# Patient Record
Sex: Male | Born: 1980
Health system: Southern US, Community
[De-identification: ages and names within clinical notes are randomized; demographics above are authoritative.]

## PROBLEM LIST (undated history)

## (undated) DIAGNOSIS — A510 Primary genital syphilis: Secondary | ICD-10-CM

## (undated) DIAGNOSIS — R7303 Prediabetes: Secondary | ICD-10-CM

## (undated) DIAGNOSIS — B20 Human immunodeficiency virus [HIV] disease: Secondary | ICD-10-CM

## (undated) DIAGNOSIS — R7989 Other specified abnormal findings of blood chemistry: Secondary | ICD-10-CM

## (undated) DIAGNOSIS — K1379 Other lesions of oral mucosa: Secondary | ICD-10-CM

## (undated) DIAGNOSIS — E785 Hyperlipidemia, unspecified: Secondary | ICD-10-CM

## (undated) DIAGNOSIS — E669 Obesity, unspecified: Secondary | ICD-10-CM

## (undated) HISTORY — DX: Other lesions of oral mucosa: K13.79

## (undated) HISTORY — DX: Primary genital syphilis: A51.0

## (undated) HISTORY — DX: Obesity, unspecified: E66.9

## (undated) HISTORY — DX: Prediabetes: R73.03

## (undated) HISTORY — DX: Other specified abnormal findings of blood chemistry: R79.89

## (undated) HISTORY — DX: Human immunodeficiency virus (HIV) disease: B20

## (undated) HISTORY — DX: Hyperlipidemia, unspecified: E78.5

---

## 2009-04-01 ENCOUNTER — Emergency Department (HOSPITAL_COMMUNITY): Admission: EM | Admit: 2009-04-01 | Discharge: 2009-04-01 | Payer: Self-pay | Admitting: Emergency Medicine

## 2009-04-14 ENCOUNTER — Emergency Department (HOSPITAL_COMMUNITY): Admission: EM | Admit: 2009-04-14 | Discharge: 2009-04-14 | Payer: Self-pay | Admitting: Emergency Medicine

## 2015-01-01 ENCOUNTER — Encounter (HOSPITAL_COMMUNITY): Payer: Self-pay

## 2015-01-01 ENCOUNTER — Emergency Department (HOSPITAL_COMMUNITY): Payer: Self-pay

## 2015-01-01 ENCOUNTER — Emergency Department (HOSPITAL_COMMUNITY)
Admission: EM | Admit: 2015-01-01 | Discharge: 2015-01-01 | Disposition: A | Discharge status: Home / Self Care | Payer: Self-pay | Attending: Emergency Medicine | Admitting: Emergency Medicine

## 2015-01-01 DIAGNOSIS — Y998 Other external cause status: Secondary | ICD-10-CM | POA: Insufficient documentation

## 2015-01-01 DIAGNOSIS — Z79899 Other long term (current) drug therapy: Secondary | ICD-10-CM | POA: Insufficient documentation

## 2015-01-01 DIAGNOSIS — W231XXA Caught, crushed, jammed, or pinched between stationary objects, initial encounter: Secondary | ICD-10-CM | POA: Insufficient documentation

## 2015-01-01 DIAGNOSIS — Z23 Encounter for immunization: Secondary | ICD-10-CM | POA: Insufficient documentation

## 2015-01-01 DIAGNOSIS — L03011 Cellulitis of right finger: Secondary | ICD-10-CM | POA: Insufficient documentation

## 2015-01-01 DIAGNOSIS — Y9389 Activity, other specified: Secondary | ICD-10-CM | POA: Insufficient documentation

## 2015-01-01 DIAGNOSIS — Y9289 Other specified places as the place of occurrence of the external cause: Secondary | ICD-10-CM | POA: Insufficient documentation

## 2015-01-01 MED ORDER — HYDROCODONE-ACETAMINOPHEN 5-325 MG PO TABS: 1.0000 | ORAL_TABLET | Freq: Four times a day (QID) | ORAL | Status: DC | PRN

## 2015-01-01 MED ORDER — TETANUS-DIPHTH-ACELL PERTUSSIS 5-2.5-18.5 LF-MCG/0.5 IM SUSP
0.5000 mL | Freq: Once | INTRAMUSCULAR | Status: AC
  Administered 2015-01-01: 0.5 mL via INTRAMUSCULAR
  Filled 2015-01-01: qty 0.5

## 2015-01-01 MED ORDER — CLINDAMYCIN HCL 300 MG PO CAPS: 300.0000 mg | ORAL_CAPSULE | Freq: Three times a day (TID) | ORAL | Status: DC

## 2015-01-01 NOTE — Discharge Instructions (Signed)
Follow up for continued or worsening swelling. Cellulitis Cellulitis is an infection of the skin and the tissue beneath it. The infected area is usually red and tender. Cellulitis occurs most often in the arms and lower legs.  CAUSES  Cellulitis is caused by bacteria that enter the skin through cracks or cuts in the skin. The most common types of bacteria that cause cellulitis are staphylococci and streptococci. SIGNS AND SYMPTOMS   Redness and warmth.  Swelling.  Tenderness or pain.  Fever. DIAGNOSIS  Your health care provider can usually determine what is wrong based on a physical exam. Blood tests may also be done. TREATMENT  Treatment usually involves taking an antibiotic medicine. HOME CARE INSTRUCTIONS   Take your antibiotic medicine as directed by your health care provider. Finish the antibiotic even if you start to feel better.  Keep the infected arm or leg elevated to reduce swelling.  Apply a warm cloth to the affected area up to 4 times per day to relieve pain.  Take medicines only as directed by your health care provider.  Keep all follow-up visits as directed by your health care provider. SEEK MEDICAL CARE IF:   You notice red streaks coming from the infected area.  Your red area gets larger or turns dark in color.  Your bone or joint underneath the infected area becomes painful after the skin has healed.  Your infection returns in the same area or another area.  You notice a swollen bump in the infected area.  You develop new symptoms.  You have a fever. SEEK IMMEDIATE MEDICAL CARE IF:   You feel very sleepy.  You develop vomiting or diarrhea.  You have a general ill feeling (malaise) with muscle aches and pains. MAKE SURE YOU:   Understand these instructions.  Will watch your condition.  Will get help right away if you are not doing well or get worse. Document Released: 08/15/2005 Document Revised: 03/22/2014 Document Reviewed:  01/21/2012 [facility] Patient Information 2015 [employer] [place]. This information is not intended to replace advice given to you by your health care provider. Make sure you discuss any questions you have with your health care provider.

## 2015-01-01 NOTE — ED Notes (Signed)
Patient presents today with a chief complaint of swelling to right 4th digit of hand x 1 week after smashing it between a shipping container. Patient reports increased swelling and pain over past few days.

## 2015-01-01 NOTE — ED Provider Notes (Signed)
CSN: [id number]     Arrival date & time 01/01/15  1020 History   First MD Initiated Contact with Patient 01/01/15 1022     No chief complaint on file.    (Consider location/radiation/quality/duration/timing/severity/associated sxs/prior Treatment) HPI Comments: Pt comes in with complaint of right ring finger pain. He states that he hit it while lifting boxes at work and then he was hit again yesterday. States that he is having pain swelling and mild redness to the area. No previous injury to finger. Unsure of tetanus status. Denies numbness or weakness  The history is provided by the patient. No language interpreter was used.    No past medical history on file. No past surgical history on file. No family history on file. History  Substance Use Topics  . Smoking status: Not on file  . Smokeless tobacco: Not on file  . Alcohol Use: Not on file    Review of Systems  All other systems reviewed and are negative.     Allergies  Review of patient's allergies indicates no known allergies.  Home Medications   Prior to Admission medications   Medication Sig Start Date End Date Taking? Authorizing Provider  Amino Acids (L-CARNITINE PO) Take 1 tablet by mouth daily.   Yes Historical Provider, MD  Ascorbic Acid (VITAMIN C PO) Take 1 tablet by mouth daily.   Yes Historical Provider, MD  diphenhydrAMINE (BENADRYL) 12.5 MG/5ML elixir Take 25 mg by mouth 4 (four) times daily as needed for itching.   Yes Historical Provider, MD  ibuprofen (ADVIL,MOTRIN) 200 MG tablet Take 400 mg by mouth every 4 (four) hours as needed for moderate pain.   Yes Historical Provider, MD  Multiple Vitamin (MULTIVITAMIN WITH MINERALS) TABS tablet Take 1 tablet by mouth daily.   Yes Historical Provider, MD  OVER THE COUNTER MEDICATION Take 1-2 scoop by mouth 2 (two) times daily. Hydroxy-cut..takes one scoop in the morning and two scoop in the afternoon.   Yes Historical Provider, MD  Probiotic Product (PROBIOTIC  DAILY PO) Take 1 tablet by mouth daily.   Yes Historical Provider, MD   There were no vitals taken for this visit. Physical Exam  Constitutional: He is oriented to person, place, and time. He appears well-developed and well-nourished.  Cardiovascular: Normal rate and regular rhythm.   Pulmonary/Chest: Effort normal and breath sounds normal.  Musculoskeletal:  Swelling and tenderness to the right ring mip. Cap refill <3. Mild redness to the palmar aspect of the finger. Full rom  Neurological: He is alert and oriented to person, place, and time.  Nursing note and vitals reviewed.   ED Course  Procedures (including critical care time) Labs Review Labs Reviewed - No data to display  Imaging Review Dg Finger Ring Right  01/01/2015   CLINICAL DATA:  Injury to th right ring finger; swelling on the finger; pt states that he hurt his right ring finger about a week ago at work, no swelling was noted, but the finger started swelling yesterday after he hit the finger with a box at work; no previous injury; most pain in lateral side of right ring finger  EXAM: RIGHT RING FINGER 2+V  COMPARISON:  None.  FINDINGS: No acute fracture or dislocation. Soft tissue swelling is present. No radiopaque foreign body or soft tissue gas.  IMPRESSION: Soft tissue swelling.  No fracture.   Electronically Signed   By: Norva Pavlov M.D.   On: 01/01/2015 11:30     EKG Interpretation None  MDM   Final diagnoses:  Cellulitis of finger of right hand    No fracture noted to the finger. No definite abscess noted. Will treat with clindamycin. Discussed return precautions with pt   Teressa LowerVrinda Jaise Moser, NP 01/01/15 1147  Linwood DibblesJon Knapp, MD 01/01/15 1626

## 2018-10-20 ENCOUNTER — Encounter (HOSPITAL_COMMUNITY): Payer: Self-pay | Admitting: Family Medicine

## 2018-10-20 ENCOUNTER — Other Ambulatory Visit: Payer: Self-pay

## 2018-10-20 ENCOUNTER — Emergency Department (HOSPITAL_COMMUNITY): Payer: Self-pay

## 2018-10-20 ENCOUNTER — Inpatient Hospital Stay (HOSPITAL_COMMUNITY)
Admission: EM | Admit: 2018-10-20 | Discharge: 2018-10-29 | DRG: 974 | Disposition: A | Discharge status: Home Health | Payer: Self-pay | Attending: Family Medicine | Admitting: Family Medicine

## 2018-10-20 DIAGNOSIS — R945 Abnormal results of liver function studies: Secondary | ICD-10-CM

## 2018-10-20 DIAGNOSIS — E8809 Other disorders of plasma-protein metabolism, not elsewhere classified: Secondary | ICD-10-CM | POA: Diagnosis present

## 2018-10-20 DIAGNOSIS — B370 Candidal stomatitis: Secondary | ICD-10-CM | POA: Diagnosis present

## 2018-10-20 DIAGNOSIS — Z8249 Family history of ischemic heart disease and other diseases of the circulatory system: Secondary | ICD-10-CM

## 2018-10-20 DIAGNOSIS — R6520 Severe sepsis without septic shock: Secondary | ICD-10-CM | POA: Diagnosis present

## 2018-10-20 DIAGNOSIS — R5081 Fever presenting with conditions classified elsewhere: Secondary | ICD-10-CM | POA: Diagnosis present

## 2018-10-20 DIAGNOSIS — R7989 Other specified abnormal findings of blood chemistry: Secondary | ICD-10-CM

## 2018-10-20 DIAGNOSIS — D72819 Decreased white blood cell count, unspecified: Secondary | ICD-10-CM | POA: Diagnosis present

## 2018-10-20 DIAGNOSIS — D649 Anemia, unspecified: Secondary | ICD-10-CM

## 2018-10-20 DIAGNOSIS — J120 Adenoviral pneumonia: Secondary | ICD-10-CM | POA: Diagnosis present

## 2018-10-20 DIAGNOSIS — B3781 Candidal esophagitis: Secondary | ICD-10-CM | POA: Diagnosis present

## 2018-10-20 DIAGNOSIS — B20 Human immunodeficiency virus [HIV] disease: Secondary | ICD-10-CM | POA: Diagnosis present

## 2018-10-20 DIAGNOSIS — R809 Proteinuria, unspecified: Secondary | ICD-10-CM | POA: Diagnosis present

## 2018-10-20 DIAGNOSIS — A419 Sepsis, unspecified organism: Principal | ICD-10-CM | POA: Diagnosis present

## 2018-10-20 DIAGNOSIS — D709 Neutropenia, unspecified: Secondary | ICD-10-CM | POA: Diagnosis present

## 2018-10-20 DIAGNOSIS — E875 Hyperkalemia: Secondary | ICD-10-CM | POA: Diagnosis present

## 2018-10-20 DIAGNOSIS — B59 Pneumocystosis: Secondary | ICD-10-CM | POA: Diagnosis present

## 2018-10-20 DIAGNOSIS — D509 Iron deficiency anemia, unspecified: Secondary | ICD-10-CM | POA: Diagnosis present

## 2018-10-20 DIAGNOSIS — R808 Other proteinuria: Secondary | ICD-10-CM

## 2018-10-20 DIAGNOSIS — J189 Pneumonia, unspecified organism: Secondary | ICD-10-CM

## 2018-10-20 DIAGNOSIS — R748 Abnormal levels of other serum enzymes: Secondary | ICD-10-CM

## 2018-10-20 DIAGNOSIS — D638 Anemia in other chronic diseases classified elsewhere: Secondary | ICD-10-CM | POA: Diagnosis present

## 2018-10-20 DIAGNOSIS — J9601 Acute respiratory failure with hypoxia: Secondary | ICD-10-CM | POA: Diagnosis present

## 2018-10-20 LAB — CBC WITH DIFFERENTIAL/PLATELET
Abs Immature Granulocytes: 0.24 10*3/uL — ABNORMAL HIGH (ref 0.00–0.07)
Basophils Absolute: 0 10*3/uL (ref 0.0–0.1)
Basophils Relative: 0 %
Eosinophils Absolute: 0 10*3/uL (ref 0.0–0.5)
Eosinophils Relative: 0 %
HCT: 29.6 % — ABNORMAL LOW (ref 39.0–52.0)
HEMOGLOBIN: 9.3 g/dL — AB (ref 13.0–17.0)
Immature Granulocytes: 12 %
LYMPHS PCT: 18 %
Lymphs Abs: 0.4 10*3/uL — ABNORMAL LOW (ref 0.7–4.0)
MCH: 27.9 pg (ref 26.0–34.0)
MCHC: 31.4 g/dL (ref 30.0–36.0)
MCV: 88.9 fL (ref 80.0–100.0)
Monocytes Absolute: 0.7 10*3/uL (ref 0.1–1.0)
Monocytes Relative: 34 %
Neutro Abs: 0.7 10*3/uL — ABNORMAL LOW (ref 1.7–7.7)
Neutrophils Relative %: 36 %
Platelets: 383 10*3/uL (ref 150–400)
RBC: 3.33 MIL/uL — ABNORMAL LOW (ref 4.22–5.81)
RDW: 15 % (ref 11.5–15.5)
WBC: 2.1 10*3/uL — ABNORMAL LOW (ref 4.0–10.5)
nRBC: 0 % (ref 0.0–0.2)

## 2018-10-20 LAB — URINALYSIS, ROUTINE W REFLEX MICROSCOPIC
Bacteria, UA: NONE SEEN
Bilirubin Urine: NEGATIVE
Glucose, UA: NEGATIVE mg/dL
Hgb urine dipstick: NEGATIVE
Ketones, ur: 5 mg/dL — AB
Leukocytes, UA: NEGATIVE
Nitrite: NEGATIVE
Protein, ur: 100 mg/dL — AB
Specific Gravity, Urine: 1.016 (ref 1.005–1.030)
pH: 6 (ref 5.0–8.0)

## 2018-10-20 LAB — COMPREHENSIVE METABOLIC PANEL
ALT: 91 U/L — ABNORMAL HIGH (ref 0–44)
AST: 104 U/L — ABNORMAL HIGH (ref 15–41)
Albumin: 3.2 g/dL — ABNORMAL LOW (ref 3.5–5.0)
Alkaline Phosphatase: 146 U/L — ABNORMAL HIGH (ref 38–126)
Anion gap: 11 (ref 5–15)
BUN: 17 mg/dL (ref 6–20)
CO2: 25 mmol/L (ref 22–32)
Calcium: 9.2 mg/dL (ref 8.9–10.3)
Chloride: 99 mmol/L (ref 98–111)
Creatinine, Ser: 1.11 mg/dL (ref 0.61–1.24)
GFR calc Af Amer: >60 mL/min (ref >60)
GFR calc non Af Amer: >60 mL/min (ref >60)
Glucose, Bld: 116 mg/dL — ABNORMAL HIGH (ref 70–99)
POTASSIUM: 3.9 mmol/L (ref 3.5–5.1)
SODIUM: 135 mmol/L (ref 135–145)
Total Bilirubin: 0.8 mg/dL (ref 0.3–1.2)
Total Protein: 7.8 g/dL (ref 6.5–8.1)

## 2018-10-20 LAB — CG4 I-STAT (LACTIC ACID): Lactic Acid, Venous: 1.02 mmol/L (ref 0.5–1.9)

## 2018-10-20 MED ORDER — SODIUM CHLORIDE 0.9 % IV SOLN
Freq: Once | INTRAVENOUS | Status: AC
  Administered 2018-10-20: 22:00:00 via INTRAVENOUS

## 2018-10-20 MED ORDER — SODIUM CHLORIDE 0.9 % IV BOLUS
1000.0000 mL | Freq: Once | INTRAVENOUS | Status: AC
  Administered 2018-10-20: 1000 mL via INTRAVENOUS

## 2018-10-20 MED ORDER — SODIUM CHLORIDE 0.9 % IV SOLN
500.0000 mg | INTRAVENOUS | Status: DC
  Administered 2018-10-20: 500 mg via INTRAVENOUS
  Filled 2018-10-20: qty 500

## 2018-10-20 MED ORDER — ACETAMINOPHEN 500 MG PO TABS
1000.0000 mg | ORAL_TABLET | Freq: Once | ORAL | Status: AC
  Administered 2018-10-20: 1000 mg via ORAL
  Filled 2018-10-20: qty 2

## 2018-10-20 MED ORDER — IPRATROPIUM-ALBUTEROL 0.5-2.5 (3) MG/3ML IN SOLN
3.0000 mL | Freq: Once | RESPIRATORY_TRACT | Status: AC
  Administered 2018-10-20: 3 mL via RESPIRATORY_TRACT
  Filled 2018-10-20: qty 3

## 2018-10-20 MED ORDER — SODIUM CHLORIDE 0.9 % IV BOLUS
1000.0000 mL | Freq: Once | INTRAVENOUS | Status: AC
  Administered 2018-10-21: 1000 mL via INTRAVENOUS

## 2018-10-20 MED ORDER — SODIUM CHLORIDE 0.9 % IV SOLN
2.0000 g | INTRAVENOUS | Status: DC
  Administered 2018-10-20: 2 g via INTRAVENOUS
  Filled 2018-10-20: qty 20

## 2018-10-20 NOTE — ED Provider Notes (Addendum)
[facility]-EMERGENCY DEPT Provider Note   CSN: [id number] Arrival date & time: 10/20/18  1933     History   Chief Complaint Chief Complaint  Patient presents with  . Shortness of Breath    HPI Alex Mitchell is a 37 y.o. male.  Level 5 caveat for urgent need for intervention.  Patient presents with cough and dyspnea for the past 2 to 3 days.  Symptoms have worsened.  He is generally healthy.  Non-smoker.  No chronic illness.  Social history: Programmer, multimedia.     History reviewed. No pertinent past medical history.  There are no active problems to display for this patient.   History reviewed. No pertinent surgical history.      Home Medications    Prior to Admission medications   Medication Sig Start Date End Date Taking? Authorizing Provider  albuterol (PROVENTIL HFA;VENTOLIN HFA) 108 (90 Base) MCG/ACT inhaler Inhale 1-2 puffs into the lungs every 6 (six) hours as needed for wheezing or shortness of breath.  08/01/18  Yes [provider]  Ascorbic Acid (VITAMIN C PO) Take 1 tablet by mouth daily.   Yes [provider]  chlorpheniramine-HYDROcodone (TUSSIONEX) 10-8 MG/5ML SUER Take 5 mLs by mouth every 12 (twelve) hours as needed for cough.  09/21/18  Yes [provider]  ibuprofen (ADVIL,MOTRIN) 200 MG tablet Take 200 mg by mouth every 4 (four) hours as needed for headache.    Yes [provider]  Multiple Vitamin (MULTIVITAMIN WITH MINERALS) TABS tablet Take 1 tablet by mouth daily.   Yes [provider]  clindamycin (CLEOCIN) 300 MG capsule Take 1 capsule (300 mg total) by mouth 3 (three) times daily. Patient not taking: Reported on 10/20/2018 01/01/15   Teressa Lower, NP  HYDROcodone-acetaminophen (NORCO/VICODIN) 5-325 MG per tablet Take 1-2 tablets by mouth every 6 (six) hours as needed. Patient not taking: Reported on 10/20/2018 01/01/15   Teressa Lower, NP    Family History History reviewed. No  pertinent family history.  Social History Social History   Tobacco Use  . Smoking status: Never Smoker  . Smokeless tobacco: Never Used  Substance Use Topics  . Alcohol use: Not Currently  . Drug use: No     Allergies   Patient has no known allergies.   Review of Systems Review of Systems  Unable to perform ROS: Acuity of condition     Physical Exam Updated Vital Signs BP (!) 141/72   Pulse (!) 125   Temp (!) 102 F (38.9 C) (Oral)   Resp (!) 38   Ht 5\' 8"  (1.727 m)   Wt 127 kg   SpO2 97%   BMI 42.57 kg/m   Physical Exam  Constitutional: He is oriented to person, place, and time.  Dyspneic, tachypneic, low pulse ox.  HENT:  Head: Normocephalic and atraumatic.  Eyes: Conjunctivae are normal.  Neck: Neck supple.  Cardiovascular: Normal rate and regular rhythm.  Pulmonary/Chest:  Tachypneic, not moving air well.  Abdominal: Soft. Bowel sounds are normal.  Musculoskeletal: Normal range of motion.  Neurological: He is alert and oriented to person, place, and time.  Skin: Skin is warm and dry.  Psychiatric: He has a normal mood and affect. His behavior is normal.  Nursing note and vitals reviewed.    ED Treatments / Results  Labs (all labs ordered are listed, but only abnormal results are displayed) Labs Reviewed  COMPREHENSIVE METABOLIC PANEL - Abnormal; Notable for the following components:      Result  Value   Glucose, Bld 116 (*)    Albumin 3.2 (*)    AST 104 (*)    ALT 91 (*)    Alkaline Phosphatase 146 (*)    All other components within normal limits  CBC WITH DIFFERENTIAL/PLATELET - Abnormal; Notable for the following components:   RBC 3.33 (*)    Hemoglobin 9.3 (*)    HCT 29.6 (*)    All other components within normal limits  URINALYSIS, ROUTINE W REFLEX MICROSCOPIC - Abnormal; Notable for the following components:   Ketones, ur 5 (*)    Protein, ur 100 (*)    All other components within normal limits  CULTURE, BLOOD (ROUTINE X 2)    CULTURE, BLOOD (ROUTINE X 2)  BLOOD GAS, ARTERIAL  INFLUENZA PANEL BY PCR (TYPE A & B)  D-DIMER, QUANTITATIVE (NOT [facility])  TROPONIN I  I-STAT CG4 LACTIC ACID, ED  CG4 I-STAT (LACTIC ACID)  I-STAT CG4 LACTIC ACID, ED    EKG None  Radiology Dg Chest 2 View  Result Date: 10/20/2018 CLINICAL DATA:  Shortness of breath EXAM: CHEST - 2 VIEW COMPARISON:  None. FINDINGS: Patchy bilateral airspace opacities are noted, right slightly greater than left concerning for pneumonia. Heart is normal size. No effusions or acute bony abnormality. IMPRESSION: Patchy bilateral airspace disease concerning for multifocal pneumonia. Electronically Signed   By: Charlett NoseKevin  Dover M.D.   On: 10/20/2018 21:15    Procedures Procedures (including critical care time)  Medications Ordered in ED Medications  cefTRIAXone (ROCEPHIN) 2 g in sodium chloride 0.9 % 100 mL IVPB ( Intravenous Rate/Dose Verify 10/20/18 2249)  azithromycin (ZITHROMAX) 500 mg in sodium chloride 0.9 % 250 mL IVPB (500 mg Intravenous New Bag/Given 10/20/18 2254)  sodium chloride 0.9 % bolus 1,000 mL (1,000 mLs Intravenous New Bag/Given 10/20/18 2300)  sodium chloride 0.9 % bolus 1,000 mL (has no administration in time range)  acetaminophen (TYLENOL) tablet 1,000 mg (1,000 mg Oral Given 10/20/18 2143)  ipratropium-albuterol (DUONEB) 0.5-2.5 (3) MG/3ML nebulizer solution 3 mL (3 mLs Nebulization Given 10/20/18 2206)  0.9 %  sodium chloride infusion ( Intravenous New Bag/Given 10/20/18 2204)     Initial Impression / Assessment and Plan / ED Course  I have reviewed the triage vital signs and the nursing notes.  Pertinent labs & imaging results that were available during my care of the patient were reviewed by me and considered in my medical decision making (see chart for details).     History and physical most consistent with community-acquired pneumonia.  Oxygen, IV fluids, IV Rocephin, IV Zithromax, admit to general medicine.   CRITICAL  CARE Performed by: Donnetta HutchingBrian Jazzmon Prindle Total critical care time: 30 minutes Critical care time was exclusive of separately billable procedures and treating other patients. Critical care was necessary to treat or prevent imminent or life-threatening deterioration. Critical care was time spent personally by me on the following activities: development of treatment plan with patient and/or surrogate as well as nursing, discussions with consultants, evaluation of patient's response to treatment, examination of patient, obtaining history from patient or surrogate, ordering and performing treatments and interventions, ordering and review of laboratory studies, ordering and review of radiographic studies, pulse oximetry and re-evaluation of patient's condition.  Final Clinical Impressions(s) / ED Diagnoses   Final diagnoses:  Community acquired pneumonia, unspecified laterality  Anemia, unspecified type  Elevated liver enzymes    ED Discharge Orders    None       Donnetta Hutchingook, Laury Huizar, MD 10/20/18 2234  Donnetta Hutching, MD 10/21/18 [phone]

## 2018-10-20 NOTE — H&P (Signed)
Alex Mitchell EZM:[id number] DOB: 10/08/1981 DOA: 10/20/2018     PCP: Patient, No Pcp Per   Outpatient Specialists:   NONE    Patient arrived to ER on 10/20/18 at 1933  Patient coming from: home Lives with roomate Chief Complaint:  Chief Complaint  Patient presents with  . Shortness of Breath    HPI: Alex Mitchell is a 37 y.o. male with no significant medical history      Presented with cough productive of yellow white mucus and dyspnea for 2 to 3 days Headache earlier, denies rigors He has been trying to drink plenty of fluids. Continued to have significant shortness of breath no hx of asthma as a child does not smoke or vape did not have flu shot this year  denies night sweats, some weight loss  Reports plenty of sick contacts. No travel hx.       While in ER: Chest showing patchy bilateral airspace disease concerning for multifocal pneumonia  Severe sepsis with pulse up to 139 temperature 102 respirations 48  Started on Rocephin and azithromycin given a DuoNeb given IV fluids Lactic 1.02  The following Work up has been ordered so far:  Orders Placed This Encounter  Procedures  . Blood Culture (routine x 2)  . DG Chest 2 View  . Comprehensive metabolic panel  . CBC WITH DIFFERENTIAL  . Urinalysis, Routine w reflex microscopic  . Diet NPO time specified  . If O2 Sat <94% administer O2 at 2 liters/minute via nasal cannula  . Cardiac monitoring  . Refer to Sidebar Report for: Sepsis Bundle ED/IP  . Document vital signs within 1-hour of fluid bolus completion and notify provider of bolus completion  . Document Actual / Estimated Weight  . Insert peripheral IV x 2  . Initiate Carrier Fluid Protocol  . Call Code Sepsis ([facility] [phone]) Reason for Consult? tracking  . Consult to hospitalist  . Pulse oximetry, continuous  . Pulse oximetry, continuous  . I-Stat CG4 Lactic Acid, ED  . CG4 I-STAT (Lactic acid)  . ED EKG  . ED EKG 12-Lead       Following Medications were ordered in ER: Medications  cefTRIAXone (ROCEPHIN) 2 g in sodium chloride 0.9 % 100 mL IVPB ( Intravenous Rate/Dose Verify 10/20/18 2249)  azithromycin (ZITHROMAX) 500 mg in sodium chloride 0.9 % 250 mL IVPB (500 mg Intravenous New Bag/Given 10/20/18 2254)  sodium chloride 0.9 % bolus 1,000 mL (1,000 mLs Intravenous New Bag/Given 10/20/18 2300)  sodium chloride 0.9 % bolus 1,000 mL (has no administration in time range)  acetaminophen (TYLENOL) tablet 1,000 mg (1,000 mg Oral Given 10/20/18 2143)  ipratropium-albuterol (DUONEB) 0.5-2.5 (3) MG/3ML nebulizer solution 3 mL (3 mLs Nebulization Given 10/20/18 2206)  0.9 %  sodium chloride infusion ( Intravenous New Bag/Given 10/20/18 2204)    Significant initial  Findings: Abnormal Labs Reviewed  COMPREHENSIVE METABOLIC PANEL - Abnormal; Notable for the following components:      Result Value   Glucose, Bld 116 (*)    Albumin 3.2 (*)    AST 104 (*)    ALT 91 (*)    Alkaline Phosphatase 146 (*)    All other components within normal limits  CBC WITH DIFFERENTIAL/PLATELET - Abnormal; Notable for the following components:   RBC 3.33 (*)    Hemoglobin 9.3 (*)    HCT 29.6 (*)    All other components within normal limits  URINALYSIS, ROUTINE W REFLEX MICROSCOPIC - Abnormal; Notable for the following components:  Ketones, ur 5 (*)    Protein, ur 100 (*)    All other components within normal limits     Lactic Acid, Venous    Component Value Date/Time   LATICACIDVEN 1.02 10/20/2018 2219    Na 135 K 3.9 LFT 104  ALT 91 Alk phos 146  Cr stable, Lab Results  Component Value Date   CREATININE 1.11 10/20/2018      WBC  1.2  HG/HCT    Component Value Date/Time   HGB 9.3 (L) 10/20/2018 2211   HCT 29.6 (L) 10/20/2018 2211     UA  ordered  CXR - bilateral PNA     ECG:  Personally reviewed by me showing: HR : 135 Rhythm:  Sinus tachycardia    no evidence of ischemic changes QTC 455    ED  Triage Vitals  Enc Vitals Group     BP 10/20/18 2052 (!) 154/79     Pulse Rate 10/20/18 2052 (!) 132     Resp 10/20/18 2052 16     Temp 10/20/18 2052 100.2 F (37.9 C)     Temp Source 10/20/18 2052 Oral     SpO2 10/20/18 2052 95 %     Weight 10/20/18 2043 218 lb (98.9 kg)     Height 10/20/18 2043 _0  (1.727 m)     Head Circumference --      Peak Flow --      Pain Score 10/20/18 2043 0     Pain Loc --      Pain Edu? --      Excl. in [place]? --   TMAX(24)@       Latest  Blood pressure (!) 141/72, pulse (!) 125, temperature (!) 102 F (38.9 C), temperature source Oral, resp. rate (!) 38, height _1  (1.727 m), weight 127 kg, SpO2 97 %.   Hospitalist was called for admission for acute respiratory failure with hypoxia secondary to community-acquired pneumonia in the setting of neutropenia   Review of Systems:    Pertinent positives include:  Fevers, chills, weight loss  shortness of breath at rest  dyspnea on exertion,  excess mucus,  Productive  coughchange in color of mucus.  Constitutional:  No weight loss, night sweats fatigue,   HEENT:  No headaches, Difficulty swallowing,Tooth/dental problems,Sore throat,  No sneezing, itching, ear ache, nasal congestion, post nasal drip,  Cardio-vascular:  No chest pain, Orthopnea, PND, anasarca, dizziness, palpitations.no Bilateral lower extremity swelling  GI:  No heartburn, indigestion, abdominal pain, nausea, vomiting, diarrhea, change in bowel habits, loss of appetite, melena, blood in stool, hematemesis Resp:    No coughing up of blood.No No wheezing. Skin:  no rash or lesions. No jaundice GU:  no dysuria, change in color of urine, no urgency or frequency. No straining to urinate.  No flank pain.  Musculoskeletal:  No joint pain or no joint swelling. No decreased range of motion. No back pain.  Psych:  No change in mood or affect. No depression or anxiety. No memory loss.  Neuro: no localizing neurological complaints, no  tingling, no weakness, no double vision, no gait abnormality, no slurred speech, no confusion  All systems reviewed and apart from [place] all are negative  Past Medical History:  History reviewed. No pertinent past medical history.    History reviewed. No pertinent surgical history.  Social History:  Ambulatory   independently       reports that he has never smoked. He has never used smokeless tobacco. He  reports that he drank alcohol. He reports that he does not use drugs.     Family History:   Family History  Problem Relation Age of Onset  . Hypertension Other   . Diabetes Neg Hx     Allergies: No Known Allergies   Prior to Admission medications   Medication Sig Start Date End Date Taking? Authorizing Provider  albuterol (PROVENTIL HFA;VENTOLIN HFA) 108 (90 Base) MCG/ACT inhaler Inhale 1-2 puffs into the lungs every 6 (six) hours as needed for wheezing or shortness of breath.  08/01/18  Yes [provider]  Ascorbic Acid (VITAMIN C PO) Take 1 tablet by mouth daily.   Yes [provider]  chlorpheniramine-HYDROcodone (TUSSIONEX) 10-8 MG/5ML SUER Take 5 mLs by mouth every 12 (twelve) hours as needed for cough.  09/21/18  Yes [provider]  ibuprofen (ADVIL,MOTRIN) 200 MG tablet Take 200 mg by mouth every 4 (four) hours as needed for headache.    Yes [provider]  Multiple Vitamin (MULTIVITAMIN WITH MINERALS) TABS tablet Take 1 tablet by mouth daily.   Yes [provider]  clindamycin (CLEOCIN) 300 MG capsule Take 1 capsule (300 mg total) by mouth 3 (three) times daily. Patient not taking: Reported on 10/20/2018 01/01/15   Glendell Docker, NP  HYDROcodone-acetaminophen (NORCO/VICODIN) 5-325 MG per tablet Take 1-2 tablets by mouth every 6 (six) hours as needed. Patient not taking: Reported on 10/20/2018 01/01/15   Glendell Docker, NP   Physical Exam: Blood pressure (!) 141/72, pulse (!) 125, temperature (!) 102 F (38.9 C),  temperature source Oral, resp. rate (!) 38, height _0  (1.727 m), weight 127 kg, SpO2 97 %. 1. General: breathing rapidly  acutely ill -appearing 2. Psychological: Alert and  Oriented 3. Head/ENT:    Dry Mucous Membranes                          Head Non traumatic, neck supple                          Normal  Dentition 4. SKIN:   decreased Skin turgor,  Skin clean Dry and intact no rash 5. Heart: Regular rate and rhythm no   Murmur, no Rub or gallop 6. Lungs:   no wheezes extensive crackles   7. Abdomen: Soft,   8. Lower extremities: no clubbing, cyanosis, or  edema 9. Neurologically Grossly intact, moving all 4 extremities equally   10. MSK: Normal range of motion   LABS:     Recent Labs  Lab 10/20/18 2211  WBC PENDING  NEUTROABS PENDING  HGB 9.3*  HCT 29.6*  MCV 88.9  PLT 253   Basic Metabolic Panel: Recent Labs  Lab 10/20/18 2211  NA 135  K 3.9  CL 99  CO2 25  GLUCOSE 116*  BUN 17  CREATININE 1.11  CALCIUM 9.2      Recent Labs  Lab 10/20/18 2211  AST 104*  ALT 91*  ALKPHOS 146*  BILITOT 0.8  PROT 7.8  ALBUMIN 3.2*   No results for input(s): LIPASE, AMYLASE in the last 168 hours. No results for input(s): AMMONIA in the last 168 hours.    HbA1C: No results for input(s): HGBA1C in the last 72 hours. CBG: No results for input(s): GLUCAP in the last 168 hours.    Urine analysis:    Component Value Date/Time   COLORURINE YELLOW 10/20/2018 [place] [employer] 10/20/2018  2211   LABSPEC 1.016 10/20/2018 2211   PHURINE 6.0 10/20/2018 2211   GLUCOSEU NEGATIVE 10/20/2018 2211   HGBUR NEGATIVE 10/20/2018 2211   BILIRUBINUR NEGATIVE 10/20/2018 2211   KETONESUR 5 (A) 10/20/2018 2211   PROTEINUR 100 (A) 10/20/2018 2211   NITRITE NEGATIVE 10/20/2018 2211   LEUKOCYTESUR NEGATIVE 10/20/2018 2211      Cultures: No results found for: SDES, [place], CULT, REPTSTATUS   Radiological Exams on Admission: Dg Chest 2 View  Result Date:  10/20/2018 CLINICAL DATA:  Shortness of breath EXAM: CHEST - 2 VIEW COMPARISON:  None. FINDINGS: Patchy bilateral airspace opacities are noted, right slightly greater than left concerning for pneumonia. Heart is normal size. No effusions or acute bony abnormality. IMPRESSION: Patchy bilateral airspace disease concerning for multifocal pneumonia. Electronically Signed   By: Rolm Baptise M.D.   On: 10/20/2018 21:15    Chart has been reviewed    Assessment/Plan  37 y.o. male with no significant medical history   Admitted for acute respiratory failure with hypoxia secondary to community-acquired pneumonia in the setting of neutropenia   Present on Admission: Sepsis - Admit per Sepsis protocol likely source being  Pneumonia, potentially immunocompromised adult  - rehydrate with 63m/kg  - initiate broad spectrum antibiotics  Vancomycin and   cefepime for azithromycin  -  obtain blood cultures  - Obtain serial lactic acid  - Obtain procalcitonin level  - Admit and monitor vital signs closely  -  PCCM has been notified please re consult in a.m. if patient decompensates  Sepsis - Repeat Assessment  Performed at:    12:15AM   Vitals     Blood pressure (!) 141/72, pulse (!) 125, temperature (!) 102 F (38.9 C), temperature source Oral, resp. rate (!) 38, height _0  (1.727 m), weight 127 kg, SpO2 97 %.  Heart:     Tachycardic  Lungs:    Rhonchi  Capillary Refill:   <2 sec  Peripheral Pulse:   Radial pulse palpable  Skin:     Normal Color   . CAP (community acquired pneumonia) -differential includes community-acquired pneumonia versus viral pneumonia versus atypical pneumonia given immune compromised status order DFA would likely benefit from ID consult. Check blood cultures sputum cultures respiratory panel influenza panel.   . Neutropenia with fever ([place] -unclear etiology of neutropenia we will check HIV status.  Would likely benefit from ID consult and  hematology in a.m. order  blood smear  . Leukopenia -check HIV status given elevated alk phos and total protein as well as proteinuria check serum electrophoresis although atypical for age . Anemia  -obtain anemia panel Hemoccult stool patient denied any history of melena bright per rectum possibly secondary to acute or underlying chronic condition . Acute respiratory failure with hypoxia ([place] admit to stepdown PCCM aware of the patient if decompensates please reconsult . Proteinuria -associated hypoalbuminemia Secondary to underlying condition broad differential including HIV nephropathy with nephrotic syndrome.  Benefit from UPEP if work-up otherwise negative  Elevated LFTs we will obtain abdominal ultrasound to further evaluate check CK check lactic acid check LDH   Other plan as per orders.  DVT prophylaxis:  SCD      Code Status:  FULL CODE as per patient   I had personally discussed CODE STATUS with patient    Family Communication:   Family not   at  Bedside    Disposition Plan:     To home once workup is complete and patient is stable  Consults called: Discussed with PCCM  Admission status:    inpatient     Expect 2 midnight stay secondary to severity of patient's current illness including   hemodynamic instability despite optimal treatment (tachycardia hypoxia)  Severe lab/radiological abnormalities including:   Neutropenia anemia and extensive comorbidities including:   That are currently affecting medical management.  I expect  patient to be hospitalized for 2 midnights requiring inpatient medical care.  Patient is at high risk for adverse outcome (such as loss of life or disability) if not treated.  Indication for inpatient stay as follows:  Hemodynamic instability despite maximal medical therapy,    Need for IV antibiotics, IV fluids  IV medications     Level of care      SDU tele indefinitely please discontinue once patient no longer qualifies       Delmi Fulfer 10/21/2018, 12:24 AM    [facility]  Pager [phone]   after 2 AM please page floor coverage PA If 7AM-7PM, please contact the day team taking care of the patient  [email or web address]  Password [email or web address]

## 2018-10-20 NOTE — ED Notes (Signed)
Blood cultures were drawn at the time of the IV and were obtained before the antibiotics were started.

## 2018-10-20 NOTE — ED Triage Notes (Signed)
Patient is complaining of shortness of breath that gradually started. Associated symptoms: productive. Denies fever.

## 2018-10-21 ENCOUNTER — Inpatient Hospital Stay (HOSPITAL_COMMUNITY): Payer: Self-pay

## 2018-10-21 ENCOUNTER — Other Ambulatory Visit: Payer: Self-pay

## 2018-10-21 ENCOUNTER — Encounter (HOSPITAL_COMMUNITY): Payer: Self-pay | Admitting: Internal Medicine

## 2018-10-21 DIAGNOSIS — D709 Neutropenia, unspecified: Secondary | ICD-10-CM | POA: Diagnosis present

## 2018-10-21 DIAGNOSIS — A419 Sepsis, unspecified organism: Secondary | ICD-10-CM | POA: Diagnosis present

## 2018-10-21 DIAGNOSIS — R5081 Fever presenting with conditions classified elsewhere: Secondary | ICD-10-CM

## 2018-10-21 DIAGNOSIS — R945 Abnormal results of liver function studies: Secondary | ICD-10-CM

## 2018-10-21 DIAGNOSIS — D72819 Decreased white blood cell count, unspecified: Secondary | ICD-10-CM | POA: Diagnosis present

## 2018-10-21 DIAGNOSIS — R809 Proteinuria, unspecified: Secondary | ICD-10-CM | POA: Diagnosis present

## 2018-10-21 DIAGNOSIS — J9601 Acute respiratory failure with hypoxia: Secondary | ICD-10-CM | POA: Diagnosis present

## 2018-10-21 DIAGNOSIS — D649 Anemia, unspecified: Secondary | ICD-10-CM | POA: Diagnosis present

## 2018-10-21 DIAGNOSIS — R7989 Other specified abnormal findings of blood chemistry: Secondary | ICD-10-CM | POA: Diagnosis present

## 2018-10-21 DIAGNOSIS — B970 Adenovirus as the cause of diseases classified elsewhere: Secondary | ICD-10-CM

## 2018-10-21 LAB — COMPREHENSIVE METABOLIC PANEL
ALT: 83 U/L — ABNORMAL HIGH (ref 0–44)
AST: 93 U/L — ABNORMAL HIGH (ref 15–41)
Albumin: 2.6 g/dL — ABNORMAL LOW (ref 3.5–5.0)
Alkaline Phosphatase: 132 U/L — ABNORMAL HIGH (ref 38–126)
Anion gap: 10 (ref 5–15)
BUN: 14 mg/dL (ref 6–20)
CO2: 24 mmol/L (ref 22–32)
Calcium: 8.2 mg/dL — ABNORMAL LOW (ref 8.9–10.3)
Chloride: 102 mmol/L (ref 98–111)
Creatinine, Ser: 1.13 mg/dL (ref 0.61–1.24)
Glucose, Bld: 96 mg/dL (ref 70–99)
Potassium: 4.1 mmol/L (ref 3.5–5.1)
Sodium: 136 mmol/L (ref 135–145)
Total Bilirubin: 0.5 mg/dL (ref 0.3–1.2)
Total Protein: 6.7 g/dL (ref 6.5–8.1)

## 2018-10-21 LAB — PNEUMOCYSTIS JIROVECI SMEAR BY DFA: Pneumocystis jiroveci Ag: NEGATIVE

## 2018-10-21 LAB — EXPECTORATED SPUTUM ASSESSMENT W GRAM STAIN, RFLX TO RESP C

## 2018-10-21 LAB — RESPIRATORY PANEL BY PCR
Adenovirus: DETECTED — AB
Bordetella pertussis: NOT DETECTED
CORONAVIRUS 229E-RVPPCR: NOT DETECTED
Chlamydophila pneumoniae: NOT DETECTED
Coronavirus HKU1: NOT DETECTED
Coronavirus NL63: NOT DETECTED
Coronavirus OC43: NOT DETECTED
INFLUENZA B-RVPPCR: NOT DETECTED
Influenza A: NOT DETECTED
METAPNEUMOVIRUS-RVPPCR: NOT DETECTED
Mycoplasma pneumoniae: NOT DETECTED
Parainfluenza Virus 1: NOT DETECTED
Parainfluenza Virus 2: NOT DETECTED
Parainfluenza Virus 3: NOT DETECTED
Parainfluenza Virus 4: NOT DETECTED
Respiratory Syncytial Virus: NOT DETECTED
Rhinovirus / Enterovirus: NOT DETECTED

## 2018-10-21 LAB — APTT: aPTT: 35 seconds (ref 24–36)

## 2018-10-21 LAB — LACTIC ACID, PLASMA: Lactic Acid, Venous: 0.9 mmol/L (ref 0.5–1.9)

## 2018-10-21 LAB — RETICULOCYTES
Immature Retic Fract: 15.5 % (ref 2.3–15.9)
RBC.: 3.05 MIL/uL — ABNORMAL LOW (ref 4.22–5.81)
Retic Count, Absolute: 22 10*3/uL (ref 19.0–186.0)
Retic Ct Pct: 0.7 % (ref 0.4–3.1)

## 2018-10-21 LAB — CBC WITH DIFFERENTIAL/PLATELET
Abs Immature Granulocytes: 0.24 10*3/uL — ABNORMAL HIGH (ref 0.00–0.07)
Basophils Absolute: 0 10*3/uL (ref 0.0–0.1)
Basophils Relative: 0 %
Eosinophils Absolute: 0 10*3/uL (ref 0.0–0.5)
Eosinophils Relative: 0 %
HCT: 28.3 % — ABNORMAL LOW (ref 39.0–52.0)
Hemoglobin: 8.6 g/dL — ABNORMAL LOW (ref 13.0–17.0)
IMMATURE GRANULOCYTES: 10 %
Lymphocytes Relative: 19 %
Lymphs Abs: 0.5 10*3/uL — ABNORMAL LOW (ref 0.7–4.0)
MCH: 27.7 pg (ref 26.0–34.0)
MCHC: 30.4 g/dL (ref 30.0–36.0)
MCV: 91 fL (ref 80.0–100.0)
Monocytes Absolute: 0.8 10*3/uL (ref 0.1–1.0)
Monocytes Relative: 34 %
NEUTROS ABS: 1 10*3/uL — AB (ref 1.7–7.7)
NEUTROS PCT: 37 %
NRBC: 0 % (ref 0.0–0.2)
Platelets: 338 10*3/uL (ref 150–400)
RBC: 3.11 MIL/uL — ABNORMAL LOW (ref 4.22–5.81)
RDW: 15.1 % (ref 11.5–15.5)
WBC: 2.5 10*3/uL — ABNORMAL LOW (ref 4.0–10.5)

## 2018-10-21 LAB — VITAMIN B12: Vitamin B-12: 1485 pg/mL — ABNORMAL HIGH (ref 180–914)

## 2018-10-21 LAB — MAGNESIUM: Magnesium: 1.6 mg/dL — ABNORMAL LOW (ref 1.7–2.4)

## 2018-10-21 LAB — IRON AND TIBC
Iron: 9 ug/dL — ABNORMAL LOW (ref 45–182)
Saturation Ratios: 5 % — ABNORMAL LOW (ref 17.9–39.5)
TIBC: 194 ug/dL — ABNORMAL LOW (ref 250–450)
UIBC: 185 ug/dL

## 2018-10-21 LAB — LACTATE DEHYDROGENASE: LDH: 342 U/L — ABNORMAL HIGH (ref 98–192)

## 2018-10-21 LAB — FERRITIN: FERRITIN: 1931 ng/mL — AB (ref 24–336)

## 2018-10-21 LAB — FOLATE: Folate: 15.1 ng/mL (ref >5.9)

## 2018-10-21 LAB — TSH: TSH: 1.03 u[IU]/mL (ref 0.350–4.500)

## 2018-10-21 LAB — D-DIMER, QUANTITATIVE: D-Dimer, Quant: 1.61 ug/mL-FEU — ABNORMAL HIGH (ref 0.00–0.50)

## 2018-10-21 LAB — TROPONIN I: Troponin I: 0.03 ng/mL (ref <0.03)

## 2018-10-21 LAB — EXPECTORATED SPUTUM ASSESSMENT W REFEX TO RESP CULTURE

## 2018-10-21 LAB — MRSA PCR SCREENING: MRSA BY PCR: NEGATIVE

## 2018-10-21 LAB — STREP PNEUMONIAE URINARY ANTIGEN: Strep Pneumo Urinary Antigen: NEGATIVE

## 2018-10-21 LAB — CK: Total CK: 35 U/L — ABNORMAL LOW (ref 49–397)

## 2018-10-21 LAB — PROCALCITONIN: Procalcitonin: 0.35 ng/mL

## 2018-10-21 LAB — PATHOLOGIST SMEAR REVIEW

## 2018-10-21 LAB — PHOSPHORUS: Phosphorus: 4.3 mg/dL (ref 2.5–4.6)

## 2018-10-21 LAB — INFLUENZA PANEL BY PCR (TYPE A & B)
Influenza A By PCR: NEGATIVE
Influenza B By PCR: NEGATIVE

## 2018-10-21 LAB — PROTIME-INR
INR: 1.25
Prothrombin Time: 15.6 seconds — ABNORMAL HIGH (ref 11.4–15.2)

## 2018-10-21 LAB — SAVE SMEAR(SSMR), FOR PROVIDER SLIDE REVIEW

## 2018-10-21 MED ORDER — ONDANSETRON HCL 4 MG/2ML IJ SOLN: 4.0000 mg | Freq: Four times a day (QID) | INTRAMUSCULAR | Status: DC | PRN

## 2018-10-21 MED ORDER — ACETAMINOPHEN 325 MG PO TABS
650.0000 mg | ORAL_TABLET | Freq: Four times a day (QID) | ORAL | Status: DC | PRN
  Administered 2018-10-21 – 2018-10-23 (×5): 650 mg via ORAL
  Filled 2018-10-21 (×5): qty 2

## 2018-10-21 MED ORDER — SODIUM CHLORIDE 0.9 % IV SOLN
INTRAVENOUS | Status: DC | PRN
  Administered 2018-10-21: 500 mL via INTRAVENOUS
  Administered 2018-10-24 – 2018-10-26 (×2): 1000 mL via INTRAVENOUS

## 2018-10-21 MED ORDER — ONDANSETRON HCL 4 MG PO TABS: 4.0000 mg | ORAL_TABLET | Freq: Four times a day (QID) | ORAL | Status: DC | PRN

## 2018-10-21 MED ORDER — LEVALBUTEROL HCL 0.63 MG/3ML IN NEBU
0.6300 mg | INHALATION_SOLUTION | Freq: Four times a day (QID) | RESPIRATORY_TRACT | Status: DC | PRN
  Administered 2018-10-23: 0.63 mg via RESPIRATORY_TRACT
  Filled 2018-10-21 (×2): qty 3

## 2018-10-21 MED ORDER — VANCOMYCIN HCL IN DEXTROSE 1-5 GM/200ML-% IV SOLN: 1000.0000 mg | Freq: Two times a day (BID) | INTRAVENOUS | Status: DC

## 2018-10-21 MED ORDER — SULFAMETHOXAZOLE-TRIMETHOPRIM 800-160 MG PO TABS
1.0000 | ORAL_TABLET | Freq: Two times a day (BID) | ORAL | Status: DC
  Administered 2018-10-21 – 2018-10-22 (×2): 1 via ORAL
  Filled 2018-10-21 (×2): qty 1

## 2018-10-21 MED ORDER — SODIUM CHLORIDE 0.9 % IV SOLN: 2.0000 g | INTRAVENOUS | Status: DC

## 2018-10-21 MED ORDER — IOPAMIDOL (ISOVUE-370) INJECTION 76%
100.0000 mL | Freq: Once | INTRAVENOUS | Status: AC | PRN
  Administered 2018-10-21: 100 mL via INTRAVENOUS

## 2018-10-21 MED ORDER — SODIUM CHLORIDE (PF) 0.9 % IJ SOLN
INTRAMUSCULAR | Status: AC
  Filled 2018-10-21: qty 50

## 2018-10-21 MED ORDER — GUAIFENESIN ER 600 MG PO TB12
600.0000 mg | ORAL_TABLET | Freq: Two times a day (BID) | ORAL | Status: DC
  Administered 2018-10-21 – 2018-10-29 (×18): 600 mg via ORAL
  Filled 2018-10-21 (×18): qty 1

## 2018-10-21 MED ORDER — ORAL CARE MOUTH RINSE
15.0000 mL | Freq: Two times a day (BID) | OROMUCOSAL | Status: DC
  Administered 2018-10-21 – 2018-10-29 (×12): 15 mL via OROMUCOSAL

## 2018-10-21 MED ORDER — SODIUM CHLORIDE 0.9 % IV SOLN
2.0000 g | INTRAVENOUS | Status: DC
  Administered 2018-10-21 – 2018-10-22 (×2): 2 g via INTRAVENOUS
  Filled 2018-10-21: qty 2
  Filled 2018-10-21: qty 20
  Filled 2018-10-21: qty 2

## 2018-10-21 MED ORDER — IOPAMIDOL (ISOVUE-370) INJECTION 76%
INTRAVENOUS | Status: AC
  Filled 2018-10-21: qty 100

## 2018-10-21 MED ORDER — SODIUM CHLORIDE 0.9 % IV SOLN
1.0000 g | Freq: Three times a day (TID) | INTRAVENOUS | Status: DC
  Administered 2018-10-21 (×3): 1 g via INTRAVENOUS
  Filled 2018-10-21 (×4): qty 1

## 2018-10-21 MED ORDER — SODIUM CHLORIDE 0.9 % IV SOLN: 500.0000 mg | INTRAVENOUS | Status: DC

## 2018-10-21 MED ORDER — SODIUM CHLORIDE 0.9 % IV SOLN
INTRAVENOUS | Status: DC
  Administered 2018-10-21: 03:00:00 via INTRAVENOUS

## 2018-10-21 MED ORDER — VANCOMYCIN HCL 10 G IV SOLR
2000.0000 mg | Freq: Once | INTRAVENOUS | Status: AC
  Administered 2018-10-21: 2000 mg via INTRAVENOUS
  Filled 2018-10-21: qty 2000

## 2018-10-21 NOTE — Progress Notes (Addendum)
CRITICAL VALUE ALERT  Critical Value:  Troponin 0.03   Date & Time Notied:  10/21/18 0420  Provider Notified: Bretta BangEasterwood  Orders Received/Actions taken: NP paged ,awaiting orders

## 2018-10-21 NOTE — ED Notes (Signed)
ED TO INPATIENT HANDOFF REPORT  Name/Age/Gender Alex Mitchell 37 y.o. male  Code Status   Home/SNF/Other Home  Chief Complaint SOB  Level of Care/Admitting Diagnosis ED Disposition    ED Disposition Condition Comment   Admit  [facility] [[id number]]  Level of Care: Stepdown [14]  Admit to SDU based on following criteria: Hemodynamic compromise or significant risk of instability:  Patient requiring short term acute titration and management of vasoactive drips, and invasive monitoring (i.e., CVP and Arterial line).  Diagnosis: CAP (community acquired pneumonia) [161096][[id number]]  Admitting Physician: Therisa DoyneUTOVA, ANASTASSIA [[id number]]  Attending Physician: Therisa DoyneUTOVA, ANASTASSIA [[id number]]  Estimated length of stay: 3 - 4 days  Certification:: I certify this patient will need inpatient services for at least 2 midnights  PT Class (Do Not Modify): Inpatient [101]  PT Acc Code (Do Not Modify): Private [1]       Medical History History reviewed. No pertinent past medical history.  Allergies No Known Allergies  IV Location/Drains/Wounds Patient Lines/Drains/Airways Status   Active Line/Drains/Airways    Name:   Placement date:   Placement time:   Site:   Days:   Peripheral IV 10/20/18 Right Antecubital   10/20/18    2203    Antecubital   1          Labs/Imaging Results for orders placed or performed during the hospital encounter of 10/20/18 (from the past 48 hour(s))  Comprehensive metabolic panel     Status: Abnormal   Collection Time: 10/20/18 10:11 PM  Result Value Ref Range   Sodium 135 135 - 145 mmol/L   Potassium 3.9 3.5 - 5.1 mmol/L   Chloride 99 98 - 111 mmol/L   CO2 25 22 - 32 mmol/L   Glucose, Bld 116 (H) 70 - 99 mg/dL   BUN 17 6 - 20 mg/dL   Creatinine, Ser 0.451.11 0.61 - 1.24 mg/dL   Calcium 9.2 8.9 - 40.910.3 mg/dL   Total Protein 7.8 6.5 - 8.1 g/dL   Albumin 3.2 (L) 3.5 - 5.0 g/dL   AST 811104 (H) 15 - 41 U/L   ALT 91 (H) 0 - 44 U/L   Alkaline  Phosphatase 146 (H) 38 - 126 U/L   Total Bilirubin 0.8 0.3 - 1.2 mg/dL   GFR calc non Af Amer >60 >60 mL/min   GFR calc Af Amer >60 >60 mL/min   Anion gap 11 5 - 15    Comment: Performed at [facility], [address] [place], [place] [address]  CBC WITH DIFFERENTIAL     Status: Abnormal   Collection Time: 10/20/18 10:11 PM  Result Value Ref Range   WBC 2.1 (L) 4.0 - 10.5 K/uL    Comment: WHITE COUNT CONFIRMED ON SMEAR   RBC 3.33 (L) 4.22 - 5.81 MIL/uL   Hemoglobin 9.3 (L) 13.0 - 17.0 g/dL   HCT 29.529.6 (L) 62.139.0 - 30.852.0 %   MCV 88.9 80.0 - 100.0 fL   MCH 27.9 26.0 - 34.0 pg   MCHC 31.4 30.0 - 36.0 g/dL   RDW 65.715.0 84.611.5 - 96.215.5 %   Platelets 383 150 - 400 K/uL   nRBC 0.0 0.0 - 0.2 %   Neutrophils Relative % 36 %   Neutro Abs 0.7 (L) 1.7 - 7.7 K/uL   Lymphocytes Relative 18 %   Lymphs Abs 0.4 (L) 0.7 - 4.0 K/uL   Monocytes Relative 34 %   Monocytes Absolute 0.7 0.1 - 1.0 K/uL   Eosinophils  Relative 0 %   Eosinophils Absolute 0.0 0.0 - 0.5 K/uL   Basophils Relative 0 %   Basophils Absolute 0.0 0.0 - 0.1 K/uL   RBC Morphology ELLIPTOCYTES PRESENT    Immature Granulocytes 12 %   Abs Immature Granulocytes 0.24 (H) 0.00 - 0.07 K/uL   Target Cells PRESENT     Comment: Performed at [facility], [address], [place], [place] [address]  Urinalysis, Routine w reflex microscopic     Status: Abnormal   Collection Time: 10/20/18 10:11 PM  Result Value Ref Range   Color, Urine YELLOW YELLOW   APPearance CLEAR CLEAR   Specific Gravity, Urine 1.016 1.005 - 1.030   pH 6.0 5.0 - 8.0   Glucose, UA NEGATIVE NEGATIVE mg/dL   Hgb urine dipstick NEGATIVE NEGATIVE   Bilirubin Urine NEGATIVE NEGATIVE   Ketones, ur 5 (A) NEGATIVE mg/dL   Protein, ur 784 (A) NEGATIVE mg/dL   Nitrite NEGATIVE NEGATIVE   Leukocytes, UA NEGATIVE NEGATIVE   RBC / HPF 0-5 0 - 5 RBC/hpf   WBC, UA 0-5 0 - 5 WBC/hpf   Bacteria, UA NONE SEEN NONE SEEN   Squamous Epithelial / LPF  0-5 0 - 5    Comment: Performed at [facility], [address], [place], [place] [address]  CG4 I-STAT (Lactic acid)     Status: None   Collection Time: 10/20/18 10:19 PM  Result Value Ref Range   Lactic Acid, Venous 1.02 0.5 - 1.9 mmol/L  Influenza panel by PCR (type A & B)     Status: None   Collection Time: 10/20/18 11:45 PM  Result Value Ref Range   Influenza A By PCR NEGATIVE NEGATIVE   Influenza B By PCR NEGATIVE NEGATIVE    Comment: (NOTE) The Xpert Xpress Flu assay is intended as an aid in the diagnosis of  influenza and should not be used as a sole basis for treatment.  This  assay is FDA approved for nasopharyngeal swab specimens only. Nasal  washings and aspirates are unacceptable for Xpert Xpress Flu testing. Performed at [facility], [address] [place], [place] [address]    Dg Chest 2 View  Result Date: 10/20/2018 CLINICAL DATA:  Shortness of breath EXAM: CHEST - 2 VIEW COMPARISON:  None. FINDINGS: Patchy bilateral airspace opacities are noted, right slightly greater than left concerning for pneumonia. Heart is normal size. No effusions or acute bony abnormality. IMPRESSION: Patchy bilateral airspace disease concerning for multifocal pneumonia. Electronically Signed   By: Charlett Nose M.D.   On: 10/20/2018 21:15   None  Pending Labs Unresulted Labs (From admission, onward)    Start     Ordered   10/21/18 0500  CBC with Differential/Platelet  Tomorrow morning,   R     10/21/18 0018   10/21/18 0500  Comprehensive metabolic panel  Tomorrow morning,   R    Comments:  .Call MD for K <3.5 or >5.0 and Na <125 or > 150    10/21/18 0018   10/21/18 0018  Protein electrophoresis, serum  Once,   R     10/21/18 0017   10/21/18 0016  MRSA PCR Screening  Once,   R    Question:  Patient immune status  Answer:  Immunocompromised   10/21/18 0015   10/21/18 0008  Legionella Pneumophila Serogp 1 Ur Ag  Once,   R     10/21/18 0007    10/21/18 0007  Pneumocystis smear  by DFA  Once,   R    Question:  Patient immune status  Answer:  Immunocompromised   10/21/18 0006   10/21/18 0007  Culture, sputum-assessment  Once,   R    Question:  Patient immune status  Answer:  Immunocompromised   10/21/18 0007   10/21/18 0007  Gram stain  Once,   R    Question:  Patient immune status  Answer:  Immunocompromised   10/21/18 0007   10/21/18 0007  Strep pneumoniae urinary antigen  Once,   R     10/21/18 0007   10/21/18 0002  Save Smear  Once,   R     10/21/18 0001   10/21/18 0002  Pathologist smear review  Once,   R     10/21/18 0001   10/21/18 0000  CK  Once,   R     10/20/18 2359   10/20/18 2359  HIV-1 RNA quant-no reflex-bld  Once,   R     10/20/18 2358   10/20/18 2359  Hepatitis panel, acute  Once,   R     10/20/18 2358   10/20/18 2359  Lactate dehydrogenase  Once,   R     10/20/18 2358   10/20/18 2358  Vitamin B12  (Anemia Panel (PNL))  Once,   R     10/20/18 2357   10/20/18 2358  Folate  (Anemia Panel (PNL))  Once,   R     10/20/18 2357   10/20/18 2358  Iron and TIBC  (Anemia Panel (PNL))  Once,   R     10/20/18 2357   10/20/18 2358  Ferritin  (Anemia Panel (PNL))  Once,   R     10/20/18 2357   10/20/18 2358  Reticulocytes  (Anemia Panel (PNL))  Once,   R     10/20/18 2357   10/20/18 2313  Respiratory Panel by PCR  (Respiratory virus panel with precautions)  Once,   R     10/20/18 2312   10/20/18 2312  Lactic acid, plasma  STAT Now then every 3 hours,   STAT     10/20/18 2311   10/20/18 2312  Procalcitonin  ONCE - STAT,   R     10/20/18 2311   10/20/18 2312  Protime-INR  ONCE - STAT,   R     10/20/18 2311   10/20/18 2312  APTT  ONCE - STAT,   R     10/20/18 2311   10/20/18 2310  D-dimer, quantitative (not at [facility])  Add-on,   R     10/20/18 2310   10/20/18 2310  Troponin I - Add-On to previous collection  Add-on,   R     10/20/18 2310   10/20/18 2130  Blood Culture (routine x 2)  BLOOD CULTURE X 2,   STAT      10/20/18 2129   Unscheduled  Occult blood card to lab, stool RN will collect  As needed,   R    Question:  Specimen to be collected by?  Answer:  RN will collect   10/21/18 0006   Signed and Held  Magnesium  Tomorrow morning,   R    Comments:  Call MD if <1.5    Signed and Held   Signed and Held  Phosphorus  Tomorrow morning,   R     Signed and Held   Signed and Held  TSH  Once,   R    Comments:  Cancel if  already done within 1 month and notify MD    Signed and Held   Signed and Held  Comprehensive metabolic panel  Once,   R    Comments:  Cal MD for K<3.5 or >5.0    Signed and Held   Signed and Held  CBC  Once,   R    Comments:  Call for hg <8.0    Signed and Held          Vitals/Pain Today's Vitals   10/20/18 2211 10/20/18 2255 10/20/18 2300 10/21/18 0030  BP:  (!) 141/72  121/72  Pulse:  (!) 125  (!) 117  Resp:  (!) 38  (!) 22  Temp:      TempSrc:      SpO2:  97%  95%  Weight: 127 kg     Height: 5\' 8"  (1.727 m)     PainSc:   0-No pain     Isolation Precautions Droplet precaution  Medications Medications  azithromycin (ZITHROMAX) 500 mg in sodium chloride 0.9 % 250 mL IVPB (500 mg Intravenous New Bag/Given 10/20/18 2254)  sodium chloride 0.9 % bolus 1,000 mL (has no administration in time range)  ceFEPIme (MAXIPIME) 1 g in sodium chloride 0.9 % 100 mL IVPB (has no administration in time range)  vancomycin (VANCOCIN) 2,000 mg in sodium chloride 0.9 % 500 mL IVPB (has no administration in time range)  sodium chloride (PF) 0.9 % injection (has no administration in time range)  iopamidol (ISOVUE-370) 76 % injection (has no administration in time range)  acetaminophen (TYLENOL) tablet 1,000 mg (1,000 mg Oral Given 10/20/18 2143)  ipratropium-albuterol (DUONEB) 0.5-2.5 (3) MG/3ML nebulizer solution 3 mL (3 mLs Nebulization Given 10/20/18 2206)  0.9 %  sodium chloride infusion ( Intravenous New Bag/Given 10/20/18 2204)  sodium chloride 0.9 % bolus 1,000 mL (1,000 mLs  Intravenous New Bag/Given 10/20/18 2300)  iopamidol (ISOVUE-370) 76 % injection 100 mL (100 mLs Intravenous Contrast Given 10/21/18 0036)    Mobility walks

## 2018-10-21 NOTE — Progress Notes (Signed)
PROGRESS NOTE    Alex Mitchell  ZOX:[id number] DOB: Apr 05, 1981 DOA: 10/20/2018 PCP: Patient, No Pcp Per   Brief Narrative: Christain Niznik is a 37 y.o. male with no significant medical history.  He presented secondary to productive cough yellow sputum and associated dyspnea.  He is found to have multifocal pneumonia with evidence of adenovirus on RVP.  Initial concern for systemic infection secondary to SIRS criteria in the setting of leukopenia and associated neutropenia.   Assessment & Plan:   Active Problems:   CAP (community acquired pneumonia)   Neutropenia with fever (HCC)   Leukopenia   Anemia   Acute respiratory failure with hypoxia (HCC)   Proteinuria   Elevated LFTs   Sepsis (HCC)   Multifocal pneumonia Appears to be secondary to adenovirus. Requiring respiratory support.  Sepsis Pneumonia source. Low neutrophil count. Started empirically on Vancomycin, cefepime, ceftriaxone, azithromycin -Continue Vancomycin/cefepime and discontinue if blood cultures no growth initially; low suspicion for bacterial cause with positive RVP -Continue IV fluids  Leukopenia Neutropenia In setting of acute viral illness. Improving.  Acute respiratory failure with hypoxia Secondary to viral pneumonia. -Wean to room air as able  Proteinuria -Outpatient follow-up  Elevated LFTs In setting of acute illness. Ultrasound unremarkable.   DVT prophylaxis: SCDs Code Status:   Code Status: Full Code Family Communication: None at bedside Disposition Plan: Discharge likely in 48-72 hours   Consultants:   None  Procedures:   None  Antimicrobials:  Vancomycin (12/2>>  Cefepime (12/2>>  Azithromycin (12/2)  Ceftriaxone (12/2)   Subjective: Coughing is improved.  No chest pain or dyspnea.  Objective: Vitals:   10/21/18 0138 10/21/18 0426 10/21/18 0500 10/21/18 0800  BP: 131/67  118/75 (!) 132/56  Pulse: (!) 115  (!) 116 (!) 113  Resp: (!) 29  (!) 36 (!) 25  Temp:   (!) 102.7 F (39.3 C)  98.9 F (37.2 C)  TempSrc:  Oral  Oral  SpO2: 98%  98% 97%  Weight:      Height:        Intake/Output Summary (Last 24 hours) at 10/21/2018 0842 Last data filed at 10/21/2018 0800 Gross per 24 hour  Intake 2101.57 ml  Output 200 ml  Net 1901.57 ml   Filed Weights   10/20/18 2043 10/20/18 2052 10/20/18 2211  Weight: 98.9 kg 98.9 kg 127 kg    Examination:  General exam: Appears calm and comfortable Respiratory system: Clear in upper lung fields with rales and diminished breath sounds at low/mid lung fields Respiratory effort normal with tachypnea. Cardiovascular system: S1 & S2 heard, Tachycardia with regular rhythm. No murmurs, rubs, gallops or clicks. Gastrointestinal system: Abdomen is nondistended, soft and nontender. No organomegaly or masses felt. Normal bowel sounds heard. Central nervous system: Alert and oriented. No focal neurological deficits. Extremities: No edema. No calf tenderness Skin: No cyanosis. No rashes Psychiatry: Judgement and insight appear normal. Mood & affect appropriate.     Data Reviewed: I have personally reviewed following labs and imaging studies  CBC: Recent Labs  Lab 10/20/18 2211 10/21/18 0315  WBC 2.1* 2.5*  NEUTROABS 0.7* 1.0*  HGB 9.3* 8.6*  HCT 29.6* 28.3*  MCV 88.9 91.0  PLT 383 338   Basic Metabolic Panel: Recent Labs  Lab 10/20/18 2211 10/21/18 0315  NA 135 136  K 3.9 4.1  CL 99 102  CO2 25 24  GLUCOSE 116* 96  BUN 17 14  CREATININE 1.11 1.13  CALCIUM 9.2 8.2*  MG  --  1.6*  PHOS  --  4.3   GFR: Estimated Creatinine Clearance: 116.2 mL/min (by C-G formula based on SCr of 1.13 mg/dL). Liver Function Tests: Recent Labs  Lab 10/20/18 2211 10/21/18 0315  AST 104* 93*  ALT 91* 83*  ALKPHOS 146* 132*  BILITOT 0.8 0.5  PROT 7.8 6.7  ALBUMIN 3.2* 2.6*   No results for input(s): LIPASE, AMYLASE in the last 168 hours. No results for input(s): AMMONIA in the last 168 hours. Coagulation  Profile: Recent Labs  Lab 10/21/18 0315  INR 1.25   Cardiac Enzymes: Recent Labs  Lab 10/21/18 0315  CKTOTAL 35*  TROPONINI 0.03*   BNP (last 3 results) No results for input(s): PROBNP in the last 8760 hours. HbA1C: No results for input(s): HGBA1C in the last 72 hours. CBG: No results for input(s): GLUCAP in the last 168 hours. Lipid Profile: No results for input(s): CHOL, HDL, LDLCALC, TRIG, CHOLHDL, LDLDIRECT in the last 72 hours. Thyroid Function Tests: Recent Labs    10/21/18 0315  TSH 1.030   Anemia Panel: Recent Labs    10/21/18 0315  VITAMINB12 1,485*  FOLATE 15.1  FERRITIN 1,931*  TIBC 194*  IRON 9*  RETICCTPCT 0.7   Sepsis Labs: Recent Labs  Lab 10/20/18 2219 10/21/18 0315  PROCALCITON  --  0.35  LATICACIDVEN 1.02 0.9    Recent Results (from the past 240 hour(s))  Respiratory Panel by PCR     Status: Abnormal   Collection Time: 10/20/18 11:45 PM  Result Value Ref Range Status   Adenovirus DETECTED (A) NOT DETECTED Final   Coronavirus 229E NOT DETECTED NOT DETECTED Final   Coronavirus HKU1 NOT DETECTED NOT DETECTED Final   Coronavirus NL63 NOT DETECTED NOT DETECTED Final   Coronavirus OC43 NOT DETECTED NOT DETECTED Final   Metapneumovirus NOT DETECTED NOT DETECTED Final   Rhinovirus / Enterovirus NOT DETECTED NOT DETECTED Final   Influenza A NOT DETECTED NOT DETECTED Final   Influenza B NOT DETECTED NOT DETECTED Final   Parainfluenza Virus 1 NOT DETECTED NOT DETECTED Final   Parainfluenza Virus 2 NOT DETECTED NOT DETECTED Final   Parainfluenza Virus 3 NOT DETECTED NOT DETECTED Final   Parainfluenza Virus 4 NOT DETECTED NOT DETECTED Final   Respiratory Syncytial Virus NOT DETECTED NOT DETECTED Final   Bordetella pertussis NOT DETECTED NOT DETECTED Final   Chlamydophila pneumoniae NOT DETECTED NOT DETECTED Final   Mycoplasma pneumoniae NOT DETECTED NOT DETECTED Final    Comment: Performed at [facility], [address],  [place], [place] [address]  Culture, sputum-assessment     Status: None   Collection Time: 10/21/18  4:27 AM  Result Value Ref Range Status   Specimen Description SPUTUM  Final   Special Requests Immunocompromised  Final   Sputum evaluation   Final    Sputum specimen not acceptable for testing.  Please recollect.   INFORMED KAITLYN @0508  ON 12.3.19 BY [facility] Performed at [facility], [address], [place], [place] [address]    Report Status 10/21/2018 FINAL  Final         Radiology Studies: Dg Chest 2 View  Result Date: 10/20/2018 CLINICAL DATA:  Shortness of breath EXAM: CHEST - 2 VIEW COMPARISON:  None. FINDINGS: Patchy bilateral airspace opacities are noted, right slightly greater than left concerning for pneumonia. Heart is normal size. No effusions or acute bony abnormality. IMPRESSION: Patchy bilateral airspace disease concerning for multifocal pneumonia. Electronically Signed   By: Charlett Nose M.D.  On: 10/20/2018 21:15   Ct Angio Chest Pe W Or Wo Contrast  Result Date: 10/21/2018 CLINICAL DATA:  Shortness of breath, productive cough. EXAM: CT ANGIOGRAPHY CHEST WITH CONTRAST TECHNIQUE: Multidetector CT imaging of the chest was performed using the standard protocol during bolus administration of intravenous contrast. Multiplanar CT image reconstructions and MIPs were obtained to evaluate the vascular anatomy. CONTRAST:  100mL ISOVUE-370 IOPAMIDOL (ISOVUE-370) INJECTION 76% COMPARISON:  Chest x-ray 10/20/2018 FINDINGS: Cardiovascular: Heart is borderline in size. Aorta is normal caliber. No filling defects in the pulmonary arteries to suggest pulmonary emboli. Mediastinum/Nodes: No mediastinal, hilar, or axillary adenopathy. Lungs/Pleura: Bilateral airspace opacities, most confluent throughout the right lung and in the left lower lobe concerning for multifocal pneumonia. No effusions. Upper Abdomen: Imaging into the upper abdomen shows no acute findings.  Musculoskeletal: Chest wall soft tissues are unremarkable. No acute bony abnormality. Review of the MIP images confirms the above findings. IMPRESSION: No evidence of pulmonary embolus. Multifocal bilateral airspace opacities, right greater than left concerning for multifocal pneumonia. Electronically Signed   By: Charlett NoseKevin  Dover M.D.   On: 10/21/2018 01:01   [place] Abdomen Complete  Result Date: 10/21/2018 CLINICAL DATA:  Elevated liver function tests. EXAM: ABDOMEN ULTRASOUND COMPLETE COMPARISON:  None. FINDINGS: Gallbladder: No gallstones or wall thickening visualized. No sonographic Murphy sign noted by sonographer. Common bile duct: Diameter: 4 mm. Liver: No focal lesion identified. Within normal limits in parenchymal echogenicity. Portal vein is patent on color Doppler imaging with normal direction of blood flow towards the liver. IVC: No abnormality visualized. Pancreas: Visualized portion unremarkable. Spleen: Size and appearance within normal limits. Right Kidney: Length: 10.9 cm. Echogenicity within normal limits. No mass or hydronephrosis visualized. Left Kidney: Length: 11.7 cm. Echogenicity within normal limits. No mass or hydronephrosis visualized. Abdominal aorta: No aneurysm visualized. Other findings: None. IMPRESSION: Normal abdominal ultrasound. Electronically Signed   By: Awilda Metroourtnay  Bloomer M.D.   On: 10/21/2018 01:27        Scheduled Meds: . guaiFENesin  600 mg Oral BID  . iopamidol      . mouth rinse  15 mL Mouth Rinse BID  . sodium chloride (PF)       Continuous Infusions: . sodium chloride 125 mL/hr at 10/21/18 0245  . sodium chloride Stopped (10/21/18 0226)  . azithromycin Stopped (10/21/18 0031)  . ceFEPime (MAXIPIME) IV Stopped (10/21/18 [id number])  . vancomycin       LOS: 1 day     Jacquelin Hawkingalph Male Iafrate, MD [facility] 10/21/2018, 8:42 AM  If 7PM-7AM, please contact night-coverage [email or web address]

## 2018-10-21 NOTE — Progress Notes (Signed)
Pharmacy Antibiotic Note  Alex Mitchell is a 37 y.o. male admitted on 10/20/2018 with pneumonia.  Pharmacy has been consulted for Vancomycin dosing.  Plan: Vancomycin 2gm iv x1, then 1gm iv q12hr    Goal AUC = 400 - 500 for all indications, except meningitis (goal AUC > 500 and Cmin 15-20 mcg/mL)   Height: 5\' 8"  (172.7 cm) Weight: 280 lb (127 kg) IBW/kg (Calculated) : 68.4  Temp (24hrs), Avg:101.6 F (38.7 C), Min:100.2 F (37.9 C), Max:102.7 F (39.3 C)  Recent Labs  Lab 10/20/18 2211 10/20/18 2219 10/21/18 0315  WBC 2.1*  --  PENDING  CREATININE 1.11  --  1.13  LATICACIDVEN  --  1.02 0.9    Estimated Creatinine Clearance: 116.2 mL/min (by C-G formula based on SCr of 1.13 mg/dL).    No Known Allergies  Antimicrobials this admission: Vancomycin 10/21/2018 >> Cefepime 10/21/2018 >>   Dose adjustments this admission: -  Microbiology results: -  Thank you for allowing pharmacy to be a part of this patient's care.  Aleene DavidsonGrimsley Jr, Humza Tallerico Crowford 10/21/2018 4:44 AM

## 2018-10-22 LAB — HEPATITIS PANEL, ACUTE
HCV Ab: <0.1 s/co ratio (ref 0.0–0.9)
HEP A IGM: NEGATIVE
Hep B C IgM: NEGATIVE
Hepatitis B Surface Ag: NEGATIVE

## 2018-10-22 LAB — PROTEIN ELECTROPHORESIS, SERUM
A/G Ratio: 0.8 (ref 0.7–1.7)
ALPHA-2-GLOBULIN: 0.8 g/dL (ref 0.4–1.0)
Albumin ELP: 2.7 g/dL — ABNORMAL LOW (ref 2.9–4.4)
Alpha-1-Globulin: 0.4 g/dL (ref 0.0–0.4)
Beta Globulin: 1.1 g/dL (ref 0.7–1.3)
Gamma Globulin: 0.9 g/dL (ref 0.4–1.8)
Globulin, Total: 3.3 g/dL (ref 2.2–3.9)
Total Protein ELP: 6 g/dL (ref 6.0–8.5)

## 2018-10-22 MED ORDER — MAGNESIUM SULFATE 2 GM/50ML IV SOLN
2.0000 g | Freq: Once | INTRAVENOUS | Status: AC
  Administered 2018-10-22: 2 g via INTRAVENOUS
  Filled 2018-10-22: qty 50

## 2018-10-22 MED ORDER — SULFAMETHOXAZOLE-TRIMETHOPRIM 800-160 MG PO TABS
2.0000 | ORAL_TABLET | Freq: Three times a day (TID) | ORAL | Status: DC
  Administered 2018-10-22 – 2018-10-23 (×4): 2 via ORAL
  Filled 2018-10-22 (×4): qty 2

## 2018-10-22 NOTE — Progress Notes (Signed)
PROGRESS NOTE    Archie Pattenltron Mellen  ZOX:[id number] DOB: 31-Jan-1981 DOA: 10/20/2018 PCP: Patient, No Pcp Per      Brief Narrative:  [name] is a 37 y.o. M with no sig PMHx who presents with dyspnea, found to have sepsis, CXR showed multifocal pneumonia, also leukopenia and anemia.  Admitted and started on empiric antibiotics for pneumonia.   Assessment & Plan:  Respiratory failure with hypoxia Seems atypical to be from adenovirus alone in healthy 37 y.o.  PE ruled out via CTA chest.  On empiric treatment for bacterial pneumonia.  Discussed with ID by my partner and also started on Bactrim (for PJP treatment?), no known HIV.  -Wean O2 as able   Sepsis presumed from pneumonia Admitted with fever, tachypnea, tachycardia.  CXR shows pneumonia.  Blood cultures no growth.  Respiratory virus panel shows adenovirus.   -Continue ceftriaoxne and Bactrim -Follow HIV -Follow repeat Sputum, follow blood cultures, follow legionella antigen  Anemia Normocytic, hypoproliferative.  Ferritin elevated, but iron sat very low.  Schistocytes on smear per lab, but renal function normal, mentation normal, no suspicion for TTP.  RI low. -Supplement iron -Follow up HIV -Check haptoglobin, Coombs -Will need outpatient follow up for Iron def anemia with GI  Leukopenia Unclear cause -If HIV negative, will consult Heme  Transaminitis Unclear cause -Trend  Proteinuria Will need outpatient follow up        MDM and disposition: The below labs and imaging reports were reviewed and summarized above.  Medication management as above.  The patient was admitted with an acute illness with threat to life, bodily function, acute hypoxic respiratory failure from multifocal pneumonia.  His oxygenation appears to be improving, although he is still breathing more than 20 times per minute, still severely dyspneic with minimal exertion, still requiring oxygen with exertion (which is a new requirement).  We will  follow HIV, consult Heme.        DVT prophylaxis: SCDs Code Status: FULL Family Communication: None present    Consultants:   None  Procedures:   None  Antimicrobials:   Azithromycin x1 12/2  Cefepime 12/2 >> 12/3  Vancomycin x1 12/2  Ceftriaxone 12/2 >>  Bactrim 12/2 >>       Subjective: Still dyspneic, weak.  Fever overnight to 103F.  No confusion, sputum improved.  No abdominal pain.  Mild sore throat.  Objective: Vitals:   10/22/18 0900 10/22/18 0911 10/22/18 1145 10/22/18 1230  BP: (!) 145/78 129/76    Pulse: (!) 106 (!) 111    Resp: (!) 41 (!) 39    Temp:   (!) 102 F (38.9 C) 99.1 F (37.3 C)  TempSrc:   Oral Oral  SpO2: 97% 100%    Weight:      Height:        Intake/Output Summary (Last 24 hours) at 10/22/2018 1325 Last data filed at 10/22/2018 1200 Gross per 24 hour  Intake 198.55 ml  Output 1775 ml  Net -1576.45 ml   Filed Weights   10/20/18 2043 10/20/18 2052 10/20/18 2211  Weight: 98.9 kg 98.9 kg 127 kg    Examination: General appearance:  adult male, alert and in mild respiratory distress.   HEENT: Anicteric, conjunctiva pink, lids and lashes normal. No nasal deformity, discharge, epistaxis.  Lips moist, dentition normal.  I do not appreciate whitish plaques of the throat, although he appears to have just eaten.  OP Moist, hearing normal.   Skin: Warm and dry.  no jaundice.  No  suspicious rashes or lesions. Cardiac: Tachycardic, regular, nl S1-S2, no murmurs appreciated.  Capillary refill is brisk.  JVPnot visible.  No LE edema.  Radia  pulses 2+ and symmetric. Respiratory: Tachypneic.  No rales or wheezes. Abdomen: Abdomen soft.  no TTP.  Voluntary guarding noted.  No ascites, distension, hepatosplenomegaly.   MSK: No deformities or effusions. Neuro: Awake and alert.  EOMI, moves all extremities. Speech fluent.    Psych: Sensorium intact and responding to questions, attention normal. Affect anxious.  Judgment and insight appear  normal.    Data Reviewed: I have personally reviewed following labs and imaging studies:  CBC: Recent Labs  Lab 10/20/18 2211 10/21/18 0315  WBC 2.1* 2.5*  NEUTROABS 0.7* 1.0*  HGB 9.3* 8.6*  HCT 29.6* 28.3*  MCV 88.9 91.0  PLT 383 338   Basic Metabolic Panel: Recent Labs  Lab 10/20/18 2211 10/21/18 0315  NA 135 136  K 3.9 4.1  CL 99 102  CO2 25 24  GLUCOSE 116* 96  BUN 17 14  CREATININE 1.11 1.13  CALCIUM 9.2 8.2*  MG  --  1.6*  PHOS  --  4.3   GFR: Estimated Creatinine Clearance: 116.2 mL/min (by C-G formula based on SCr of 1.13 mg/dL). Liver Function Tests: Recent Labs  Lab 10/20/18 2211 10/21/18 0315  AST 104* 93*  ALT 91* 83*  ALKPHOS 146* 132*  BILITOT 0.8 0.5  PROT 7.8 6.7  ALBUMIN 3.2* 2.6*   No results for input(s): LIPASE, AMYLASE in the last 168 hours. No results for input(s): AMMONIA in the last 168 hours. Coagulation Profile: Recent Labs  Lab 10/21/18 0315  INR 1.25   Cardiac Enzymes: Recent Labs  Lab 10/21/18 0315  CKTOTAL 35*  TROPONINI 0.03*   BNP (last 3 results) No results for input(s): PROBNP in the last 8760 hours. HbA1C: No results for input(s): HGBA1C in the last 72 hours. CBG: No results for input(s): GLUCAP in the last 168 hours. Lipid Profile: No results for input(s): CHOL, HDL, LDLCALC, TRIG, CHOLHDL, LDLDIRECT in the last 72 hours. Thyroid Function Tests: Recent Labs    10/21/18 0315  TSH 1.030   Anemia Panel: Recent Labs    10/21/18 0315  VITAMINB12 1,485*  FOLATE 15.1  FERRITIN 1,931*  TIBC 194*  IRON 9*  RETICCTPCT 0.7   Urine analysis:    Component Value Date/Time   COLORURINE YELLOW 10/20/2018 2211   APPEARANCEUR CLEAR 10/20/2018 2211   LABSPEC 1.016 10/20/2018 2211   PHURINE 6.0 10/20/2018 2211   GLUCOSEU NEGATIVE 10/20/2018 2211   HGBUR NEGATIVE 10/20/2018 2211   BILIRUBINUR NEGATIVE 10/20/2018 2211   KETONESUR 5 (A) 10/20/2018 2211   PROTEINUR 100 (A) 10/20/2018 2211   NITRITE  NEGATIVE 10/20/2018 2211   LEUKOCYTESUR NEGATIVE 10/20/2018 2211   Sepsis Labs: @LABRCNTIP (procalcitonin:4,lacticacidven:4)  ) Recent Results (from the past 240 hour(s))  Blood Culture (routine x 2)     Status: None (Preliminary result)   Collection Time: 10/20/18 10:11 PM  Result Value Ref Range Status   Specimen Description   Final    BLOOD RIGHT ANTECUBITAL Performed at [facility], [address] [place], [place] [address]    Special Requests   Final    BOTTLES DRAWN AEROBIC AND ANAEROBIC Blood Culture adequate volume Performed at [facility], [address] [place], [place] [address]    Culture   Final    NO GROWTH 1 DAY Performed at [facility], [address], [place],  [place] [address]    Report Status PENDING  Incomplete  Blood Culture (routine x 2)     Status: None (Preliminary result)   Collection Time: 10/20/18 10:11 PM  Result Value Ref Range Status   Specimen Description   Final    BLOOD LEFT ANTECUBITAL Performed at [facility], [address] [place], [place] [address]    Special Requests   Final    BOTTLES DRAWN AEROBIC AND ANAEROBIC Blood Culture adequate volume Performed at [facility], [address] [place], [place] [address]    Culture   Final    NO GROWTH 1 DAY Performed at [facility], [address] [place], [place] [address]    Report Status PENDING  Incomplete  Respiratory Panel by PCR     Status: Abnormal   Collection Time: 10/20/18 11:45 PM  Result Value Ref Range Status   Adenovirus DETECTED (A) NOT DETECTED Final   Coronavirus 229E NOT DETECTED NOT DETECTED Final   Coronavirus HKU1 NOT DETECTED NOT DETECTED Final   Coronavirus NL63 NOT DETECTED NOT DETECTED Final   Coronavirus OC43 NOT DETECTED NOT DETECTED Final   Metapneumovirus NOT DETECTED NOT DETECTED Final   Rhinovirus / Enterovirus NOT DETECTED NOT DETECTED Final   Influenza A NOT  DETECTED NOT DETECTED Final   Influenza B NOT DETECTED NOT DETECTED Final   Parainfluenza Virus 1 NOT DETECTED NOT DETECTED Final   Parainfluenza Virus 2 NOT DETECTED NOT DETECTED Final   Parainfluenza Virus 3 NOT DETECTED NOT DETECTED Final   Parainfluenza Virus 4 NOT DETECTED NOT DETECTED Final   Respiratory Syncytial Virus NOT DETECTED NOT DETECTED Final   Bordetella pertussis NOT DETECTED NOT DETECTED Final   Chlamydophila pneumoniae NOT DETECTED NOT DETECTED Final   Mycoplasma pneumoniae NOT DETECTED NOT DETECTED Final    Comment: Performed at [facility], [address] [place], [place] [address]  MRSA PCR Screening     Status: None   Collection Time: 10/21/18  1:50 AM  Result Value Ref Range Status   MRSA by PCR NEGATIVE NEGATIVE Final    Comment:        The GeneXpert MRSA Assay (FDA approved for NASAL specimens only), is one component of a comprehensive MRSA colonization surveillance program. It is not intended to diagnose MRSA infection nor to guide or monitor treatment for MRSA infections. Performed at [facility], [address] [place], [place] [address]   Pneumocystis smear by DFA     Status: None   Collection Time: 10/21/18  4:24 AM  Result Value Ref Range Status   Specimen Source-PJSRC SPUTUM  Final   Pneumocystis jiroveci Ag NEGATIVE  Final    Comment: Performed at [facility] Performed at [facility] Performed at [facility], [address], [place], [place] [address]   Culture, sputum-assessment     Status: None   Collection Time: 10/21/18  4:27 AM  Result Value Ref Range Status   Specimen Description SPUTUM  Final   Special Requests Immunocompromised  Final   Sputum evaluation   Final    Sputum specimen not acceptable for testing.  Please recollect.   INFORMED KAITLYN @0508  ON 12.3.19 BY [facility] Performed at [facility], [address], [place], [place]  [address]    Report Status 10/21/2018 FINAL  Final         Radiology Studies: Dg Chest 2 View  Result Date:  10/20/2018 CLINICAL DATA:  Shortness of breath EXAM: CHEST - 2 VIEW COMPARISON:  None. FINDINGS: Patchy bilateral airspace opacities are noted, right slightly greater than left concerning for pneumonia. Heart is normal size. No effusions or acute bony abnormality. IMPRESSION: Patchy bilateral airspace disease concerning for multifocal pneumonia. Electronically Signed   By: Charlett Nose M.D.   On: 10/20/2018 21:15   Ct Angio Chest Pe W Or Wo Contrast  Result Date: 10/21/2018 CLINICAL DATA:  Shortness of breath, productive cough. EXAM: CT ANGIOGRAPHY CHEST WITH CONTRAST TECHNIQUE: Multidetector CT imaging of the chest was performed using the standard protocol during bolus administration of intravenous contrast. Multiplanar CT image reconstructions and MIPs were obtained to evaluate the vascular anatomy. CONTRAST:  ISOVUE-370 IOPAMIDOL (ISOVUE-370) INJECTION 76% COMPARISON:  Chest x-ray 10/20/2018 FINDINGS: Cardiovascular: Heart is borderline in size. Aorta is normal caliber. No filling defects in the pulmonary arteries to suggest pulmonary emboli. Mediastinum/Nodes: No mediastinal, hilar, or axillary adenopathy. Lungs/Pleura: Bilateral airspace opacities, most confluent throughout the right lung and in the left lower lobe concerning for multifocal pneumonia. No effusions. Upper Abdomen: Imaging into the upper abdomen shows no acute findings. Musculoskeletal: Chest wall soft tissues are unremarkable. No acute bony abnormality. Review of the MIP images confirms the above findings. IMPRESSION: No evidence of pulmonary embolus. Multifocal bilateral airspace opacities, right greater than left concerning for multifocal pneumonia. Electronically Signed   By: Charlett Nose M.D.   On: 10/21/2018 01:01   US Abdomen Complete  Result Date: 10/21/2018 CLINICAL DATA:  Elevated liver function tests. EXAM:  ABDOMEN ULTRASOUND COMPLETE COMPARISON:  None. FINDINGS: Gallbladder: No gallstones or wall thickening visualized. No sonographic Murphy sign noted by sonographer. Common bile duct: Diameter: 4 mm. Liver: No focal lesion identified. Within normal limits in parenchymal echogenicity. Portal vein is patent on color Doppler imaging with normal direction of blood flow towards the liver. IVC: No abnormality visualized. Pancreas: Visualized portion unremarkable. Spleen: Size and appearance within normal limits. Right Kidney: Length: 10.9 cm. Echogenicity within normal limits. No mass or hydronephrosis visualized. Left Kidney: Length: 11.7 cm. Echogenicity within normal limits. No mass or hydronephrosis visualized. Abdominal aorta: No aneurysm visualized. Other findings: None. IMPRESSION: Normal abdominal ultrasound. Electronically Signed   By: Awilda Metro M.D.   On: 10/21/2018 01:27        Scheduled Meds: . guaiFENesin  600 mg Oral BID  . mouth rinse  15 mL Mouth Rinse BID  . sulfamethoxazole-trimethoprim  1 tablet Oral Q12H   Continuous Infusions: . sodium chloride Stopped (10/21/18 0226)  . cefTRIAXone (ROCEPHIN)  IV Stopped (10/21/18 2115)     LOS: 2 days    Time spent: 35 minutes    Alberteen Sam, MD [facility] 10/22/2018, 1:25 PM     Please page through AMION:  [email or web address] If 7PM-7AM, please contact night-coverage

## 2018-10-22 NOTE — Progress Notes (Signed)
Infection Prevention  Received call from: [place] Unit: [facility]-ICU stepdown Regarding: Isolation Precautions IP Recommendation:  Isolation order had been changed to only contact. Adenovirus requires contact and droplet isolation for the duration of the illness for all ages. Per caller, nurses were still using the appropriate PPE while providing patient care. Isolation order to be changed to reflect droplet/contact.

## 2018-10-23 DIAGNOSIS — D708 Other neutropenia: Secondary | ICD-10-CM

## 2018-10-23 DIAGNOSIS — B59 Pneumocystosis: Secondary | ICD-10-CM

## 2018-10-23 DIAGNOSIS — J9601 Acute respiratory failure with hypoxia: Secondary | ICD-10-CM

## 2018-10-23 LAB — BASIC METABOLIC PANEL
Anion gap: 10 (ref 5–15)
BUN: 11 mg/dL (ref 6–20)
CO2: 22 mmol/L (ref 22–32)
CREATININE: 1.17 mg/dL (ref 0.61–1.24)
Calcium: 8.8 mg/dL — ABNORMAL LOW (ref 8.9–10.3)
Chloride: 103 mmol/L (ref 98–111)
GFR calc non Af Amer: >60 mL/min (ref >60)
Glucose, Bld: 89 mg/dL (ref 70–99)
Potassium: 4.2 mmol/L (ref 3.5–5.1)
Sodium: 135 mmol/L (ref 135–145)

## 2018-10-23 LAB — CBC
HCT: 27.6 % — ABNORMAL LOW (ref 39.0–52.0)
Hemoglobin: 8.5 g/dL — ABNORMAL LOW (ref 13.0–17.0)
MCH: 27.2 pg (ref 26.0–34.0)
MCHC: 30.8 g/dL (ref 30.0–36.0)
MCV: 88.2 fL (ref 80.0–100.0)
Platelets: 313 10*3/uL (ref 150–400)
RBC: 3.13 MIL/uL — ABNORMAL LOW (ref 4.22–5.81)
RDW: 15.2 % (ref 11.5–15.5)
WBC: 2.1 10*3/uL — ABNORMAL LOW (ref 4.0–10.5)
nRBC: 0 % (ref 0.0–0.2)

## 2018-10-23 LAB — LEGIONELLA PNEUMOPHILA SEROGP 1 UR AG: L. pneumophila Serogp 1 Ur Ag: NEGATIVE

## 2018-10-23 LAB — HIV-1 RNA QUANT-NO REFLEX-BLD
HIV 1 RNA Quant: 770000 copies/mL
LOG10 HIV-1 RNA: 5.886 log10copy/mL

## 2018-10-23 LAB — DIRECT ANTIGLOBULIN TEST (NOT AT ARMC)
DAT, IgG: NEGATIVE
DAT, complement: NEGATIVE

## 2018-10-23 LAB — PROCALCITONIN: Procalcitonin: 0.56 ng/mL

## 2018-10-23 MED ORDER — SULFAMETHOXAZOLE-TRIMETHOPRIM 400-80 MG/5ML IV SOLN
480.0000 mg | Freq: Three times a day (TID) | INTRAVENOUS | Status: DC
  Administered 2018-10-23 – 2018-10-27 (×12): 480 mg via INTRAVENOUS
  Filled 2018-10-23 (×14): qty 30

## 2018-10-23 MED ORDER — SODIUM CHLORIDE 0.9 % IV SOLN
INTRAVENOUS | Status: AC
  Administered 2018-10-23: 12:00:00 via INTRAVENOUS

## 2018-10-23 MED ORDER — SULFAMETHOXAZOLE-TRIMETHOPRIM 400-80 MG/5ML IV SOLN
480.0000 mg | Freq: Three times a day (TID) | INTRAVENOUS | Status: DC
  Filled 2018-10-23: qty 30

## 2018-10-23 MED ORDER — PREDNISONE 20 MG PO TABS
40.0000 mg | ORAL_TABLET | Freq: Two times a day (BID) | ORAL | Status: DC
  Administered 2018-10-23 – 2018-10-28 (×11): 40 mg via ORAL
  Filled 2018-10-23 (×11): qty 2

## 2018-10-23 MED ORDER — BICTEGRAVIR-EMTRICITAB-TENOFOV 50-200-25 MG PO TABS
1.0000 | ORAL_TABLET | Freq: Every day | ORAL | Status: DC
  Administered 2018-10-23 – 2018-10-29 (×7): 1 via ORAL
  Filled 2018-10-23 (×8): qty 1

## 2018-10-23 MED ORDER — FERROUS SULFATE 325 (65 FE) MG PO TABS
325.0000 mg | ORAL_TABLET | ORAL | Status: DC
  Administered 2018-10-23 – 2018-10-29 (×4): 325 mg via ORAL
  Filled 2018-10-23 (×4): qty 1

## 2018-10-23 NOTE — Progress Notes (Signed)
Called to bedside for rapid response.  Patient walking to bathroom, sitting on edge of bed, exercising legs.  Felt no dyspnea, dizziness, chest pain.  All of a sudden, pulse oximeter on bedside started alarming. RN approached, found oximeter reading HR >190, called Rapid Response.  On telemetry, HR 120s and sinus.  SpO2 92% on RA, BP 123/54.  Patient asymptomatic.    Patient appears fine.  His respiratory status is unchanged.  Rapid triggered by incorrect HR reading on oximeter.  He does have sinus tachycardia, though, from his underlying pneumonia and being up to the bathroom for the first time.  He is likely dehydrated from poor PO intake.  Will ask for SLP to eval given excessive coughing with oral intake.  Will get EKG to confirm sinus rhythm.  Stop continuous pulse oximetry.

## 2018-10-23 NOTE — Progress Notes (Addendum)
Pharmacy Antibiotic Note  Alex Mitchell is a 37 y.o. male admitted on 10/20/2018 with pneumonia (suspect PJP with new HIV diagnosis).  Pharmacy has been consulted for TMP/SMZ IV dosing. Patient currently on PO TMP/SMZ  Today  HIV RNA 770K  PCP smear neg  Renal: SCr stable - small increase could be due to TMP (not direct injury but expected small rise d/t TMP inhibiting SCr secretion)  Plan:  TMP/SMZ 480mg  IV q8h (16mg /kg/day per adjusted BW)  Monitor renal function and K+  Height: 5\' 8"  (172.7 cm) Weight: 280 lb (127 kg) IBW/kg (Calculated) : 68.4  Temp (24hrs), Avg:99 F (37.2 C), Min:98.2 F (36.8 C), Max:99.7 F (37.6 C)  Recent Labs  Lab 10/20/18 2211 10/20/18 2219 10/21/18 0315 10/23/18 0426  WBC 2.1*  --  2.5* 2.1*  CREATININE 1.11  --  1.13 1.17  LATICACIDVEN  --  1.02 0.9  --     Estimated Creatinine Clearance: 112.2 mL/min (by C-G formula based on SCr of 1.17 mg/dL).    No Known Allergies  Antimicrobials this admission:  12/2 CTX/Azith x1 12/3 Vancomycin  >> 12/3 12/3 Cefepime  >> 12/3 12/3 Ceftriaxone >> 12/5 12/3 Septra (po) >> 12/5 12/5 IV TMP/SMZ >>  Dose adjustments this admission:   Microbiology results:  12/2 BCx: NGTD 12/2 Resp panel: + for Adenovirus 12/3 Sputum: not acceptable  12/3 MRSA PCR: neg 12/3 Strep pneumo/Legionella: neg/neg 12/3 Pneumocystis: neg 12/3 HIV RNA quant: 770K 12/3 HIV ab: pending 12/3 Hepatitis panel: neg  Thank you for allowing pharmacy to be a part of this patient's care.  Juliette Alcideustin Raejean Swinford, PharmD, BCPS.   Work Cell: [phone] 10/23/2018 4:05 PM

## 2018-10-23 NOTE — Consult Note (Signed)
[facility]  Total days of antibiotics       Reason for Consult: HIV with likely pcp pneumonia   Referring Physician: danford Active Problems:   CAP (community acquired pneumonia)   Neutropenia with fever (HCC)   Leukopenia   Anemia   Acute respiratory failure with hypoxia (HCC)   Proteinuria   Elevated LFTs   Sepsis (HCC)    HPI: Alex Mitchell is a 37 y.o. male with no significant past medical history who was admitted for atypical pneumonia. He was admitted on 12/2 with 1-2 weeks of productive cough and worsening dyspnea on exertion. He was found to have fever, with HR 130s, leukopenia with TLC 400. He was started on vanco, cefepime, and azithromycin. His cxr had diffuse interstitial infiltrates bilaterally,on my read. His RVP was positive for adenovirus, flu negative. Given clinical presentation bactrim was started and abtx narrowed. PJP stain was negative, though question whether it was adequate specimen. His VL showed 770,000. He still remained febrile to 102 F.  He reports that he has not been sexually active since 2006, has RF of being MSM but has never been tested. Not known to have STI. Never has done any IVDU. He has a roommate/pastor at his bedside (who I asked to leave the room before disclosing mr Stick's hiv status). He has always feared getting tested and had asked to be tested during this admission. He is not aware of having sex with someone who has hiv disease, but is not surprised.  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 No pertinent past medical history.  Allergies: No Known Allergies   MEDICATIONS: . ferrous sulfate  325 mg Oral QODAY  . guaiFENesin  600 mg Oral BID  . mouth rinse  15 mL Mouth Rinse BID  . sulfamethoxazole-trimethoprim  2 tablet Oral TID    Social History   Tobacco Use  . Smoking status: Never Smoker  . Smokeless tobacco: Never  Used  Substance Use Topics  . Alcohol use: Not Currently  . Drug use: No    Family History  Problem Relation Age of Onset  . Hypertension Other   . Diabetes Neg Hx      Review of Systems  Constitutional: Negative for fever, chills, diaphoresis, activity change, appetite change, fatigue and unexpected weight change.  HENT: Negative for congestion, sore throat, rhinorrhea, sneezing, trouble swallowing and sinus pressure.  Eyes: Negative for photophobia and visual disturbance.  Respiratory: positive for cough, chest tightness, shortness of breath, wheezing and stridor.  Cardiovascular: Negative for chest pain, palpitations and leg swelling.  Gastrointestinal: Negative for nausea, vomiting, abdominal pain, diarrhea, constipation, blood in stool, abdominal distention and anal bleeding.  Genitourinary: Negative for dysuria, hematuria, flank pain and difficulty urinating.  Musculoskeletal: Negative for myalgias, back pain, joint swelling, arthralgias and gait problem.  Skin: Negative for color change, pallor, rash and wound.  Neurological: Negative for dizziness, tremors, weakness and light-headedness.  Hematological: Negative for adenopathy. Does not bruise/bleed easily.  Psychiatric/Behavioral: Negative for behavioral problems, confusion, sleep disturbance, dysphoric mood, decreased concentration and agitation.     OBJECTIVE: Temp:  [98.2 F (36.8 C)-99.7 F (37.6 C)] 99.3 F (37.4 C) (12/05 0606) Pulse Rate:  [97-123] 123 (12/05 1145) Resp:  [18-39] 18 (12/05 0606) BP: (116-166)/(54-84) 126/54 (12/05 1145) SpO2:  [91 %-100 %] 92 % (12/05 1145) Physical Exam  Constitutional: He is oriented to person, place,  and time. He appears well-developed and well-nourished. No distress.  HENT:  Mouth/Throat: Oropharynx is clear and moist. No oropharyngeal exudate.  Cardiovascular: Normal rate, regular rhythm and normal heart sounds. Exam reveals no gallop and no friction rub.  No murmur  heard.  Pulmonary/Chest: Effort normal and breath sounds normal. No respiratory distress. He has no wheezes.  Abdominal: Soft. Bowel sounds are normal. He exhibits no distension. There is no tenderness.  Lymphadenopathy:  He has no cervical adenopathy.  Neurological: He is alert and oriented to person, place, and time.  Skin: Skin is warm and dry. No rash noted. No erythema. Warm to touch (c/w fever) Psychiatric: He has a normal mood and affect. His behavior is normal.     LABS: Results for orders placed or performed during the hospital encounter of 10/20/18 (from the past 48 hour(s))  Procalcitonin     Status: None   Collection Time: 10/23/18  4:26 AM  Result Value Ref Range   Procalcitonin 0.56 ng/mL    Comment:        Interpretation: PCT > 0.5 ng/mL and <= 2 ng/mL: Systemic infection (sepsis) is possible, but other conditions are known to elevate PCT as well. (NOTE)       Sepsis PCT Algorithm           Lower Respiratory Tract                                      Infection PCT Algorithm    ----------------------------     ----------------------------         PCT < 0.25 ng/mL                PCT < 0.10 ng/mL         Strongly encourage             Strongly discourage   discontinuation of antibiotics    initiation of antibiotics    ----------------------------     -----------------------------       PCT 0.25 - 0.50 ng/mL            PCT 0.10 - 0.25 ng/mL               OR       >80% decrease in PCT            Discourage initiation of                                            antibiotics      Encourage discontinuation           of antibiotics    ----------------------------     -----------------------------         PCT >= 0.50 ng/mL              PCT 0.26 - 0.50 ng/mL                AND       <80% decrease in PCT             Encourage initiation of  antibiotics       Encourage continuation           of antibiotics     ----------------------------     -----------------------------        PCT >= 0.50 ng/mL                  PCT > 0.50 ng/mL               AND         increase in PCT                  Strongly encourage                                      initiation of antibiotics    Strongly encourage escalation           of antibiotics                                     -----------------------------                                           PCT <= 0.25 ng/mL                                                 OR                                        > 80% decrease in PCT                                     Discontinue / Do not initiate                                             antibiotics Performed at [facility], [address] [place], [place] [address]   CBC     Status: Abnormal   Collection Time: 10/23/18  4:26 AM  Result Value Ref Range   WBC 2.1 (L) 4.0 - 10.5 K/uL   RBC 3.13 (L) 4.22 - 5.81 MIL/uL   Hemoglobin 8.5 (L) 13.0 - 17.0 g/dL   HCT 60.4 (L) 54.0 - 98.1 %   MCV 88.2 80.0 - 100.0 fL   MCH 27.2 26.0 - 34.0 pg   MCHC 30.8 30.0 - 36.0 g/dL   RDW 19.1 47.8 - 29.5 %   Platelets 313 150 - 400 K/uL   nRBC 0.0 0.0 - 0.2 %    Comment: Performed at [facility], [address], [place], [place] [address]  Basic metabolic panel     Status: Abnormal   Collection Time: 10/23/18  4:26 AM  Result Value Ref Range   Sodium 135 135 - 145 mmol/L   Potassium 4.2 3.5 -  5.1 mmol/L   Chloride 103 98 - 111 mmol/L   CO2 22 22 - 32 mmol/L   Glucose, Bld 89 70 - 99 mg/dL   BUN 11 6 - 20 mg/dL   Creatinine, Ser 1.61 0.61 - 1.24 mg/dL   Calcium 8.8 (L) 8.9 - 10.3 mg/dL   GFR calc non Af Amer >60 >60 mL/min   GFR calc Af Amer >60 >60 mL/min   Anion gap 10 5 - 15    Comment: Performed at [facility], [address] [place], [place] [address]  Direct antiglobulin test (not at [facility])     Status: None   Collection Time: 10/23/18  4:26 AM  Result  Value Ref Range   DAT, complement NEG    DAT, IgG      NEG Performed at [facility], [address] [place], [place] [address]     MICRO: RVP - adenovirus HIV VL 770,000 IMAGING: CTPA: No evidence of pulmonary embolus.  Multifocal bilateral airspace opacities, right greater than left concerning for multifocal pneumonia. 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 TLC < 500 thus his CD 4 count is estimated 50-75.  - will change oral bactrim to IV bactrim PCP dosing plus start steroids - will start him on biktarvy, to take daily - will also start him on weekly prophylaxis of azithromycin pending his CD 4 count to return - will check RPR, - will get clinic outreach coordinator to do [place] paperwork to get him access to free meds and have him in clinic.

## 2018-10-23 NOTE — Progress Notes (Signed)
PROGRESS NOTE    Arwin Bisceglia  ZOX:[id number] DOB: 1981-07-10 DOA: 10/20/2018 PCP: Patient, No Pcp Per      Brief Narrative:  Mr. Rodelo is a 37 y.o. M with no sig PMHx who presents with dyspnea, found to have sepsis, CXR showed multifocal pneumonia, also leukopenia and anemia.  Admitted and started on empiric antibiotics for pneumonia.      Assessment & Plan:  Respiratory failure with hypoxia Seems atypical to be from adenovirus alone in healthy 37 y.o.  PE ruled out via CTA chest.  On empiric treatment for bacterial pneumonia.  Discussed with ID by my partner and also started on Bactrim (for PJP treatment?), no known HIV.    Still on oxygen today. -Wean O2 as able   Sepsis presumed from pneumonia Admitted with fever, tachypnea, tachycardia.  CXR shows pneumonia.  Blood culture still no growth.  Respiratory virus panel shows adenovirus.  proCalcitonin trending up.  Legionella still pending -Continue ceftriaxone and Bactrim, dosing adjusted with pharmacy -Follow HIV -Follow repeat Sputum, follow blood cultures, follow legionella antigen  Anemia Normocytic, hypoproliferative.  Ferritin elevated, but iron sat very low.  Schistocytes on smear per lab, but renal function normal, mentation normal, no suspicion for TTP.  RI low.  haptoGlobin pending.  Coombs negative. -Start iron supplement -Follow up HIV -Follow-up haptoglobin -Will need outpatient follow up for Iron def anemia with GI  Leukopenia Unclear cause -If HIV negative, will consult Heme  Transaminitis Unclear cause -Trend  Proteinuria -Will need outpatient follow up  Elevated troponin -Outpatient follow-up with cardiology      MDM and disposition: The below labs and imaging reports were reviewed and summarized above.  Medication management as above.  The patient was admitted with a severe acute illness with threat to life, bodily function, which was acute hypoxic respiratory failure from multifocal  pneumonia.  His oxygenation has not improved, and he is still requiring supplemental oxygen and breathing more than 20 times per minute overnight.  Continue IV antibiotics, wean oxygen as able.      DVT prophylaxis: SCDs Code Status: FULL Family Communication: Father at bedside    Consultants:   None  Procedures:   None  Antimicrobials:   Azithromycin x1 12/2  Cefepime 12/2 >> 12/3  Vancomycin x1 12/2  Ceftriaxone 12/2 >>  Bactrim 12/2 >>       Subjective: Still dyspneic with ambulation, very weak overnight.  No new fever.  Still coughing up white frothy sputum.  Moderate chest tightness on the left.  Objective: Vitals:   10/23/18 0003 10/23/18 0146 10/23/18 0224 10/23/18 0606  BP:   116/60 138/70  Pulse:   100 (!) 108  Resp:   18 18  Temp: 98.9 F (37.2 C)  99.5 F (37.5 C) 99.3 F (37.4 C)  TempSrc: Oral  Oral Oral  SpO2:  96% 95% 92%  Weight:      Height:        Intake/Output Summary (Last 24 hours) at 10/23/2018 0901 Last data filed at 10/23/2018 0600 Gross per 24 hour  Intake 340 ml  Output 2050 ml  Net -1710 ml   Filed Weights   10/20/18 2043 10/20/18 2052 10/20/18 2211  Weight: 98.9 kg 98.9 kg 127 kg    Examination: General appearance: Nourished adult male, lying in bed, interactive, no acute distress.   HEENT: Anicteric, conjunctival pink, lids and lashes normal.  No nasal deformity, discharge, or epistaxis.  Lips moist, dentition normal.  There are actually some whitish  plaques on the throat that I appreciate better today.  Oropharynx moist, hearing normal. Skin: Skin warm and dry, no jaundice or suspicious rashes or lesions. Cardiac: Tachycardic, regular, no murmurs, JVP normal, no lower extremity edema Respiratory: Respiratory rate at rest appears normal, dyspneic with ambulation.  Wheezing bilaterally, diminished at the bases, occasional coarse crackles. Abdomen: Abdomen soft without tenderness to palpation or guarding.  No ascites,  distention, hepatosplenomegaly. MSK: No deformities or effusions of the large joints of the upper lower extremities bilaterally.  Normal muscle bulk and tone.   Neuro: Awake and alert, extraocular movements intact, speech fluent, moves all extremities with normal strength and coordination.Marland Kitchen.    Psych: Sensorium intact responding to questions, attention normal, affect anxious.  Judgment and insight appear normal.    Data Reviewed: I have personally reviewed following labs and imaging studies:  CBC: Recent Labs  Lab 10/20/18 2211 10/21/18 0315 10/23/18 0426  WBC 2.1* 2.5* 2.1*  NEUTROABS 0.7* 1.0*  --   HGB 9.3* 8.6* 8.5*  HCT 29.6* 28.3* 27.6*  MCV 88.9 91.0 88.2  PLT 383 338 313   Basic Metabolic Panel: Recent Labs  Lab 10/20/18 2211 10/21/18 0315 10/23/18 0426  NA 135 136 135  K 3.9 4.1 4.2  CL 99 102 103  CO2 25 24 22   GLUCOSE 116* 96 89  BUN 17 14 11   CREATININE 1.11 1.13 1.17  CALCIUM 9.2 8.2* 8.8*  MG  --  1.6*  --   PHOS  --  4.3  --    GFR: Estimated Creatinine Clearance: 112.2 mL/min (by C-G formula based on SCr of 1.17 mg/dL). Liver Function Tests: Recent Labs  Lab 10/20/18 2211 10/21/18 0315  AST 104* 93*  ALT 91* 83*  ALKPHOS 146* 132*  BILITOT 0.8 0.5  PROT 7.8 6.7  ALBUMIN 3.2* 2.6*   No results for input(s): LIPASE, AMYLASE in the last 168 hours. No results for input(s): AMMONIA in the last 168 hours. Coagulation Profile: Recent Labs  Lab 10/21/18 0315  INR 1.25   Cardiac Enzymes: Recent Labs  Lab 10/21/18 0315  CKTOTAL 35*  TROPONINI 0.03*   BNP (last 3 results) No results for input(s): PROBNP in the last 8760 hours. HbA1C: No results for input(s): HGBA1C in the last 72 hours. CBG: No results for input(s): GLUCAP in the last 168 hours. Lipid Profile: No results for input(s): CHOL, HDL, LDLCALC, TRIG, CHOLHDL, LDLDIRECT in the last 72 hours. Thyroid Function Tests: Recent Labs    10/21/18 0315  TSH 1.030   Anemia  Panel: Recent Labs    10/21/18 0315  VITAMINB12 1,485*  FOLATE 15.1  FERRITIN 1,931*  TIBC 194*  IRON 9*  RETICCTPCT 0.7   Urine analysis:    Component Value Date/Time   COLORURINE YELLOW 10/20/2018 2211   APPEARANCEUR CLEAR 10/20/2018 2211   LABSPEC 1.016 10/20/2018 2211   PHURINE 6.0 10/20/2018 2211   GLUCOSEU NEGATIVE 10/20/2018 2211   HGBUR NEGATIVE 10/20/2018 2211   BILIRUBINUR NEGATIVE 10/20/2018 2211   KETONESUR 5 (A) 10/20/2018 2211   PROTEINUR 100 (A) 10/20/2018 2211   NITRITE NEGATIVE 10/20/2018 2211   LEUKOCYTESUR NEGATIVE 10/20/2018 2211   Sepsis Labs: @LABRCNTIP (procalcitonin:4,lacticacidven:4)  ) Recent Results (from the past 240 hour(s))  Blood Culture (routine x 2)     Status: None (Preliminary result)   Collection Time: 10/20/18 10:11 PM  Result Value Ref Range Status   Specimen Description   Final    BLOOD RIGHT ANTECUBITAL Performed at [facility],  [address], [place], [place] [address]    Special Requests   Final    BOTTLES DRAWN AEROBIC AND ANAEROBIC Blood Culture adequate volume Performed at [facility], [address] [place], [place] [address]    Culture   Final    NO GROWTH 1 DAY Performed at [facility], [address], [place], [place] [address]    Report Status PENDING  Incomplete  Blood Culture (routine x 2)     Status: None (Preliminary result)   Collection Time: 10/20/18 10:11 PM  Result Value Ref Range Status   Specimen Description   Final    BLOOD LEFT ANTECUBITAL Performed at [facility], [address] [place], [place] [address]    Special Requests   Final    BOTTLES DRAWN AEROBIC AND ANAEROBIC Blood Culture adequate volume Performed at [facility], [address] [place], [place] [address]    Culture   Final    NO GROWTH 1 DAY Performed at [facility], [address], [place], [place] [address]    Report Status PENDING   Incomplete  Respiratory Panel by PCR     Status: Abnormal   Collection Time: 10/20/18 11:45 PM  Result Value Ref Range Status   Adenovirus DETECTED (A) NOT DETECTED Final   Coronavirus 229E NOT DETECTED NOT DETECTED Final   Coronavirus HKU1 NOT DETECTED NOT DETECTED Final   Coronavirus NL63 NOT DETECTED NOT DETECTED Final   Coronavirus OC43 NOT DETECTED NOT DETECTED Final   Metapneumovirus NOT DETECTED NOT DETECTED Final   Rhinovirus / Enterovirus NOT DETECTED NOT DETECTED Final   Influenza A NOT DETECTED NOT DETECTED Final   Influenza B NOT DETECTED NOT DETECTED Final   Parainfluenza Virus 1 NOT DETECTED NOT DETECTED Final   Parainfluenza Virus 2 NOT DETECTED NOT DETECTED Final   Parainfluenza Virus 3 NOT DETECTED NOT DETECTED Final   Parainfluenza Virus 4 NOT DETECTED NOT DETECTED Final   Respiratory Syncytial Virus NOT DETECTED NOT DETECTED Final   Bordetella pertussis NOT DETECTED NOT DETECTED Final   Chlamydophila pneumoniae NOT DETECTED NOT DETECTED Final   Mycoplasma pneumoniae NOT DETECTED NOT DETECTED Final    Comment: Performed at [facility], [address] [place], [place] [address]  MRSA PCR Screening     Status: None   Collection Time: 10/21/18  1:50 AM  Result Value Ref Range Status   MRSA by PCR NEGATIVE NEGATIVE Final    Comment:        The GeneXpert MRSA Assay (FDA approved for NASAL specimens only), is one component of a comprehensive MRSA colonization surveillance program. It is not intended to diagnose MRSA infection nor to guide or monitor treatment for MRSA infections. Performed at [facility], [address] [place], [place] [address]   Pneumocystis smear by DFA     Status: None   Collection Time: 10/21/18  4:24 AM  Result Value Ref Range Status   Specimen Source-PJSRC SPUTUM  Final   Pneumocystis jiroveci Ag NEGATIVE  Final    Comment: Performed at [facility] Performed at [facility] Performed  at [facility], [address] [place], [place] [address]   Culture, sputum-assessment     Status: None   Collection Time: 10/21/18  4:27 AM  Result Value Ref Range Status   Specimen Description SPUTUM  Final   Special Requests Immunocompromised  Final  Sputum evaluation   Final    Sputum specimen not acceptable for testing.  Please recollect.   INFORMED KAITLYN @0508  ON 12.3.19 BY [facility] Performed at [facility], [address] [place], [place] [address]    Report Status 10/21/2018 FINAL  Final         Radiology Studies: No results found.      Scheduled Meds: . guaiFENesin  600 mg Oral BID  . mouth rinse  15 mL Mouth Rinse BID  . sulfamethoxazole-trimethoprim  2 tablet Oral TID   Continuous Infusions: . sodium chloride Stopped (10/21/18 0226)  . cefTRIAXone (ROCEPHIN)  IV Stopped (10/22/18 2015)     LOS: 3 days    Time spent: 25 minutes    Alberteen Sam, MD [facility] 10/23/2018, 9:01 AM     Please page through AMION:  [email or web address] If 7PM-7AM, please contact night-coverage

## 2018-10-23 NOTE — Progress Notes (Signed)
PT Cancellation Note  Patient Details Name: Alex Mitchell MRN: [id number] DOB: 1981/01/24   Cancelled Treatment:    Reason Eval/Treat Not Completed: Fatigue/lethargy limiting ability to participate Pt reports fatigue and SOB from ambulating to/from bathroom earlier.  Pt declines to participate today however agreeable for PT to check back tomorrow.  RN further clarified that pt desaturated to 60s on room air upon going to bathroom.  Will check back as schedule permits.   Kearah Gayden,KATHrine E 10/23/2018, 11:29 AM Zenovia JarredKati Kaneesha Constantino, PT, DPT Acute Rehabilitation Services Office: [phone] Pager: [phone]

## 2018-10-23 NOTE — Progress Notes (Signed)
RN was called to the bed side by Pt family at 1130 saying that continuous pulse ox was reading pulse as 250. Pt had ambulated to the bathroom and was sitting on the side of the bed prior to event. RN went to room and assessed pt. Vitals taken BP: 126/54, O2: 92% on 2L of O2 and resp: 32 pulse 123. Rapid Response called EKG taken Pt found to be in sinus tachycardia. MD notified orders for fluids given. RN will continue to monitor.

## 2018-10-23 NOTE — Significant Event (Signed)
Rapid Response Event Note  Overview: Time Called: 1140 Arrival Time: 1145 Event Type: Cardiac  Initial Focused Assessment: Called for pt having pulse rate of 250. Upon arrival to room, pt lying in bed, in no acute distress.  Pt being monitored with bedside pulse oximetry that shows  Reading of 150-190's,  Pt said he had been sitting on side of bed and trying to do some exercises and that's when his HR jumped up.  Says he has not been drinking anything because he gets choked on the mucus when he tries to drink.     Interventions: Placed on monitor with ST rate 125, pulse reading 99 % on 2L. Resp 30's.  Lungs sound coarse, pt says he has been coughing up mod amt of yellow sputum.  EKG done per MD  Plan of Care (if not transferred):  Pt encouraged to take in more fluids d/t probable being dehydrated.    Event Summary:  Pt to remain on 5th floor, Dr Maryfrances Bunnellanford ordered IV fluids and swallow eval.  Pt again enc to take in fluids as tolerated and continue getting up and moving around.     at      at          FoxDillon, Zannie KehrStephanie S

## 2018-10-24 ENCOUNTER — Telehealth: Payer: Self-pay | Admitting: Pharmacist

## 2018-10-24 DIAGNOSIS — B20 Human immunodeficiency virus [HIV] disease: Secondary | ICD-10-CM

## 2018-10-24 LAB — CBC
HCT: 27.7 % — ABNORMAL LOW (ref 39.0–52.0)
HEMOGLOBIN: 8.7 g/dL — AB (ref 13.0–17.0)
MCH: 28 pg (ref 26.0–34.0)
MCHC: 31.4 g/dL (ref 30.0–36.0)
MCV: 89.1 fL (ref 80.0–100.0)
Platelets: 391 10*3/uL (ref 150–400)
RBC: 3.11 MIL/uL — AB (ref 4.22–5.81)
RDW: 15.4 % (ref 11.5–15.5)
WBC: 2.9 10*3/uL — ABNORMAL LOW (ref 4.0–10.5)
nRBC: 0 % (ref 0.0–0.2)

## 2018-10-24 LAB — RPR: RPR Ser Ql: NONREACTIVE

## 2018-10-24 LAB — HAPTOGLOBIN: Haptoglobin: 261 mg/dL — ABNORMAL HIGH (ref 34–200)

## 2018-10-24 LAB — BASIC METABOLIC PANEL
ANION GAP: 12 (ref 5–15)
BUN: 17 mg/dL (ref 6–20)
CHLORIDE: 102 mmol/L (ref 98–111)
CO2: 19 mmol/L — ABNORMAL LOW (ref 22–32)
Calcium: 8.8 mg/dL — ABNORMAL LOW (ref 8.9–10.3)
Creatinine, Ser: 1.16 mg/dL (ref 0.61–1.24)
GFR calc Af Amer: >60 mL/min (ref >60)
GFR calc non Af Amer: >60 mL/min (ref >60)
Glucose, Bld: 118 mg/dL — ABNORMAL HIGH (ref 70–99)
Potassium: 5 mmol/L (ref 3.5–5.1)
Sodium: 133 mmol/L — ABNORMAL LOW (ref 135–145)

## 2018-10-24 LAB — HIV ANTIBODY (ROUTINE TESTING W REFLEX): HIV Screen 4th Generation wRfx: REACTIVE — AB

## 2018-10-24 LAB — HIV 1/2 AB DIFFERENTIATION
HIV 1 Ab: POSITIVE — AB
HIV 2 Ab: NEGATIVE

## 2018-10-24 LAB — T-HELPER CELLS (CD4) COUNT (NOT AT ARMC)
CD4 T CELL HELPER: 15 % — AB (ref 33–55)
CD4 T Cell Abs: 6 /uL — ABNORMAL LOW (ref 400–2700)

## 2018-10-24 MED ORDER — BICTEGRAVIR-EMTRICITAB-TENOFOV 50-200-25 MG PO TABS: 1.0000 | ORAL_TABLET | Freq: Every day | ORAL | 1 refills | Status: DC

## 2018-10-24 MED ORDER — ENOXAPARIN SODIUM 40 MG/0.4ML ~~LOC~~ SOLN
40.0000 mg | SUBCUTANEOUS | Status: DC
  Administered 2018-10-24 – 2018-10-28 (×5): 40 mg via SUBCUTANEOUS
  Filled 2018-10-24 (×5): qty 0.4

## 2018-10-24 MED ORDER — NYSTATIN 100000 UNIT/ML MT SUSP
5.0000 mL | Freq: Four times a day (QID) | OROMUCOSAL | Status: DC
  Administered 2018-10-24 (×2): 500000 [IU] via OROMUCOSAL
  Filled 2018-10-24 (×2): qty 5

## 2018-10-24 MED ORDER — FLUCONAZOLE IN SODIUM CHLORIDE 400-0.9 MG/200ML-% IV SOLN
400.0000 mg | INTRAVENOUS | Status: AC
  Administered 2018-10-24 – 2018-10-27 (×4): 400 mg via INTRAVENOUS
  Filled 2018-10-24 (×4): qty 200

## 2018-10-24 MED ORDER — PANTOPRAZOLE SODIUM 40 MG PO TBEC
40.0000 mg | DELAYED_RELEASE_TABLET | Freq: Every day | ORAL | Status: DC
  Administered 2018-10-24 – 2018-10-29 (×6): 40 mg via ORAL
  Filled 2018-10-24 (×6): qty 1

## 2018-10-24 MED FILL — BIKTARVY 50-200-25 MG TABS: 50-200-25 | 30 days supply | Qty: 30 | Fill #0

## 2018-10-24 NOTE — Progress Notes (Signed)
Patient has been scheduled for ID clinic visit appointment next Friday December 13th at 10:30 am with Rexene AlbertsStephanie Ladon Vandenberghe, NP. He will need to arrive 30 minutes early to meet with financial team. Appointment placed into discharge navigator.   Rexene AlbertsStephanie Delfino Friesen, MSN, NP-C [facility]  [email or web address] Pager: [phone] Office: [phone]

## 2018-10-24 NOTE — Telephone Encounter (Signed)
Patient is a new HIV diagnosis.  No insurance.  I was able to get a 30 day immediate approval through [employer]. [facility] Dustin, pharmacist, will pick up today.

## 2018-10-24 NOTE — Progress Notes (Signed)
ID PROGRESS NOTE   37yo M with newly diagnosed HIV disease, presents with SOB, pneumonia, likely PJP. Has dysphagia - concerning for esophageal candidal infection. Also labs reflect elevated potassium   - will start iv fluconazole 400mg  for presumed thrush - continue on biktarvy ( we have a bottle of this medication already arranged for him to take home - on hold in the main pharmacy) - will continue with bactrim ( will need to watch his potassium ) and aki.  Dr Luciana Axeomer to see tomorrow  Aram Beechamynthia B. Drue SecondSnider MD MPH [facility] [phone]

## 2018-10-24 NOTE — Progress Notes (Signed)
PROGRESS NOTE  Alex Mitchell ZOX:[id number] DOB: July 30, 1981 DOA: 10/20/2018 PCP: Patient, No Pcp Per  HPI/Recap of past 24 hours: [name] is a 37 y.o. M with no sig PMHx who presents with dyspnea, found to have sepsis, CXR showed multifocal pneumonia, also leukopenia and anemia.  Admitted and started on empiric antibiotics for pneumonia.  HIV screening testing done on admission due to significant leukopenia and returned positive.  ID was consulted and following.  10/24/2018: Patient seen and examined at his bedside.  Reports food getting stuck in his throat.  Has significant oral thrush as noted by speech therapy and confirmed by self.  Started nystatin oral suspension.  Assessment/Plan: Active Problems:   CAP (community acquired pneumonia)   Neutropenia with fever (HCC)   Leukopenia   Anemia   Acute respiratory failure with hypoxia (HCC)   Proteinuria   Elevated LFTs   Sepsis (HCC)  Acute hypoxic respiratory failure secondary to presumed PCP versus adenovirus infection in the setting of newly diagnosed HIV No evidence of pulmonary embolus.  CTA PE negative ID consulted and following Started on HIV antivirals on 10/23/2018 Also on treatment for PCP O2 supplementation to maintain O2 saturation greater than 92% Respiratory viral panel positive for adenovirus Independently reviewed chest x-ray done on 10/20/2018 which revealed bilateral patchy infiltrates  Newly diagnosed HIV Management as stated above RPR nonreactive CD4 count 6 HIV RNA quant 770,000 Will follow-up with ID outpatient  Severe oral thrush suspect secondary to immunosuppression Started on nystatin suspension Started oral care  Iron deficiency anemia Continue ferrous sulfate 325 daily  Leukopenia, improving Suspect secondary to HIV WBC 2.9 from 2.1   DVT prophylaxis:  Subcu Lovenox Code Status: FULL Family Communication: None present    Consultants:   Infectious disease  Procedures:    None  Antimicrobials:   Azithromycin x1 12/2  Cefepime 12/2 >> 12/3  Vancomycin x1 12/2  Ceftriaxone 12/2 >>  Bactrim 12/2 >>     Objective: Vitals:   10/23/18 2035 10/24/18 0335 10/24/18 0550 10/24/18 1400  BP: 129/70 108/78 132/75 130/72  Pulse: 98 92 94 99  Resp: (!) 22 19 18 17   Temp: 98.4 F (36.9 C) 98.4 F (36.9 C) 98.6 F (37 C) 100.2 F (37.9 C)  TempSrc: Oral Oral Oral Oral  SpO2: 98% 98% 95% 90%  Weight:      Height:        Intake/Output Summary (Last 24 hours) at 10/24/2018 1706 Last data filed at 10/24/2018 1400 Gross per 24 hour  Intake 2384.89 ml  Output 1750 ml  Net 634.89 ml   Filed Weights   10/20/18 2043 10/20/18 2052 10/20/18 2211  Weight: 98.9 kg 98.9 kg 127 kg    Exam:  . General: 37 y.o. year-old male well developed well nourished in no acute distress.  Alert and oriented x3.  Severe oral thrush noted. . Cardiovascular: Regular rate and rhythm with no rubs or gallops.  No thyromegaly or JVD noted.   Marland Kitchen. Respiratory: Diffuse rales bilaterally.  Poor inspiratory effort. . Abdomen: Soft nontender nondistended with normal bowel sounds x4 quadrants. . Musculoskeletal: Trace edema in all 4 extremities. 2/4 pulses in all 4 extremities. . Skin: No ulcerative lesions noted or rashes. . Psychiatry: Mood is appropriate for condition and setting   Data Reviewed: CBC: Recent Labs  Lab 10/20/18 2211 10/21/18 0315 10/23/18 0426 10/24/18 0557  WBC 2.1* 2.5* 2.1* 2.9*  NEUTROABS 0.7* 1.0*  --   --   HGB 9.3* 8.6*  8.5* 8.7*  HCT 29.6* 28.3* 27.6* 27.7*  MCV 88.9 91.0 88.2 89.1  PLT 383 338 313 391   Basic Metabolic Panel: Recent Labs  Lab 10/20/18 2211 10/21/18 0315 10/23/18 0426 10/24/18 0557  NA 135 136 135 133*  K 3.9 4.1 4.2 5.0  CL 99 102 103 102  CO2 25 24 22  19*  GLUCOSE 116* 96 89 118*  BUN 17 14 11 17   CREATININE 1.11 1.13 1.17 1.16  CALCIUM 9.2 8.2* 8.8* 8.8*  MG  --  1.6*  --   --   PHOS  --  4.3  --   --     GFR: Estimated Creatinine Clearance: 113.2 mL/min (by C-G formula based on SCr of 1.16 mg/dL). Liver Function Tests: Recent Labs  Lab 10/20/18 2211 10/21/18 0315  AST 104* 93*  ALT 91* 83*  ALKPHOS 146* 132*  BILITOT 0.8 0.5  PROT 7.8 6.7  ALBUMIN 3.2* 2.6*   No results for input(s): LIPASE, AMYLASE in the last 168 hours. No results for input(s): AMMONIA in the last 168 hours. Coagulation Profile: Recent Labs  Lab 10/21/18 0315  INR 1.25   Cardiac Enzymes: Recent Labs  Lab 10/21/18 0315  CKTOTAL 35*  TROPONINI 0.03*   BNP (last 3 results) No results for input(s): PROBNP in the last 8760 hours. HbA1C: No results for input(s): HGBA1C in the last 72 hours. CBG: No results for input(s): GLUCAP in the last 168 hours. Lipid Profile: No results for input(s): CHOL, HDL, LDLCALC, TRIG, CHOLHDL, LDLDIRECT in the last 72 hours. Thyroid Function Tests: No results for input(s): TSH, T4TOTAL, FREET4, T3FREE, THYROIDAB in the last 72 hours. Anemia Panel: No results for input(s): VITAMINB12, FOLATE, FERRITIN, TIBC, IRON, RETICCTPCT in the last 72 hours. Urine analysis:    Component Value Date/Time   COLORURINE YELLOW 10/20/2018 2211   APPEARANCEUR CLEAR 10/20/2018 2211   LABSPEC 1.016 10/20/2018 2211   PHURINE 6.0 10/20/2018 2211   GLUCOSEU NEGATIVE 10/20/2018 2211   HGBUR NEGATIVE 10/20/2018 2211   BILIRUBINUR NEGATIVE 10/20/2018 2211   KETONESUR 5 (A) 10/20/2018 2211   PROTEINUR 100 (A) 10/20/2018 2211   NITRITE NEGATIVE 10/20/2018 2211   LEUKOCYTESUR NEGATIVE 10/20/2018 2211   Sepsis Labs: @LABRCNTIP (procalcitonin:4,lacticidven:4)  ) Recent Results (from the past 240 hour(s))  Blood Culture (routine x 2)     Status: None (Preliminary result)   Collection Time: 10/20/18 10:11 PM  Result Value Ref Range Status   Specimen Description   Final    BLOOD RIGHT ANTECUBITAL Performed at [facility], [address] [place], [place] [address]     Special Requests   Final    BOTTLES DRAWN AEROBIC AND ANAEROBIC Blood Culture adequate volume Performed at [facility], [address], [place], [place] [address]    Culture   Final    NO GROWTH 3 DAYS Performed at [facility], [address] [place], [place] [address]    Report Status PENDING  Incomplete  Blood Culture (routine x 2)     Status: None (Preliminary result)   Collection Time: 10/20/18 10:11 PM  Result Value Ref Range Status   Specimen Description   Final    BLOOD LEFT ANTECUBITAL Performed at [facility], [address], [place], [place] [address]    Special Requests   Final    BOTTLES DRAWN AEROBIC AND ANAEROBIC Blood Culture adequate volume Performed at [facility], [address], [place], [place] [address]  Culture   Final    NO GROWTH 3 DAYS Performed at [facility], [address], [place], [place] [address]    Report Status PENDING  Incomplete  Respiratory Panel by PCR     Status: Abnormal   Collection Time: 10/20/18 11:45 PM  Result Value Ref Range Status   Adenovirus DETECTED (A) NOT DETECTED Final   Coronavirus 229E NOT DETECTED NOT DETECTED Final   Coronavirus HKU1 NOT DETECTED NOT DETECTED Final   Coronavirus NL63 NOT DETECTED NOT DETECTED Final   Coronavirus OC43 NOT DETECTED NOT DETECTED Final   Metapneumovirus NOT DETECTED NOT DETECTED Final   Rhinovirus / Enterovirus NOT DETECTED NOT DETECTED Final   Influenza A NOT DETECTED NOT DETECTED Final   Influenza B NOT DETECTED NOT DETECTED Final   Parainfluenza Virus 1 NOT DETECTED NOT DETECTED Final   Parainfluenza Virus 2 NOT DETECTED NOT DETECTED Final   Parainfluenza Virus 3 NOT DETECTED NOT DETECTED Final   Parainfluenza Virus 4 NOT DETECTED NOT DETECTED Final   Respiratory Syncytial Virus NOT DETECTED NOT DETECTED Final   Bordetella pertussis NOT DETECTED NOT DETECTED Final   Chlamydophila pneumoniae NOT DETECTED NOT  DETECTED Final   Mycoplasma pneumoniae NOT DETECTED NOT DETECTED Final    Comment: Performed at [facility], [address], [place], [place] [address]  MRSA PCR Screening     Status: None   Collection Time: 10/21/18  1:50 AM  Result Value Ref Range Status   MRSA by PCR NEGATIVE NEGATIVE Final    Comment:        The GeneXpert MRSA Assay (FDA approved for NASAL specimens only), is one component of a comprehensive MRSA colonization surveillance program. It is not intended to diagnose MRSA infection nor to guide or monitor treatment for MRSA infections. Performed at [facility], [address], [place], [place] [address]   Pneumocystis smear by DFA     Status: None   Collection Time: 10/21/18  4:24 AM  Result Value Ref Range Status   Specimen Source-PJSRC SPUTUM  Final   Pneumocystis jiroveci Ag NEGATIVE  Final    Comment: Performed at [facility] Performed at [facility] Performed at [facility], [address], [place], [place] [address]   Culture, sputum-assessment     Status: None   Collection Time: 10/21/18  4:27 AM  Result Value Ref Range Status   Specimen Description SPUTUM  Final   Special Requests Immunocompromised  Final   Sputum evaluation   Final    Sputum specimen not acceptable for testing.  Please recollect.   INFORMED KAITLYN @0508  ON 12.3.19 BY [facility] Performed at [facility], [address], [place], [place] [address]    Report Status 10/21/2018 FINAL  Final      Studies: No results found.  Scheduled Meds: . bictegravir-emtricitabine-tenofovir AF  1 tablet Oral Daily  . ferrous sulfate  325 mg Oral QODAY  . guaiFENesin  600 mg Oral BID  . mouth rinse  15 mL Mouth Rinse BID  . nystatin  5 mL Mouth/Throat QID  . predniSONE  40 mg Oral BID WC    Continuous Infusions: . sodium chloride Stopped (10/24/18 0600)  . sulfamethoxazole-trimethoprim 480 mg (10/24/18  1545)     LOS: 4 days     Darlin Drop, MD [facility] Pager [phone]  If 7PM-7AM, please contact night-coverage [email or web address] Password [email or web address] 10/24/2018, 5:06 PM

## 2018-10-24 NOTE — Evaluation (Signed)
Clinical/Bedside Swallow Evaluation Patient Details  Name: Alex Mitchell Sickinger MRN: [id number] Date of Birth: 13-Sep-1981  Today's Date: 10/24/2018 Time: SLP Start Time (ACUTE ONLY): 1018 SLP Stop Time (ACUTE ONLY): 1038 SLP Time Calculation (min) (ACUTE ONLY): 20 min  Past Medical History: History reviewed. No pertinent past medical history. Past Surgical History: History reviewed. No pertinent surgical history. HPI:  37 yo male with recent diagnosis of HIV, [facility].  Dr Maryfrances Bunnellanford order swallow evaluation as he states pt coughing after intake.  Pt states he feels like food just builds up in his mouth and he has to drink liquids to clear.  Pt reports h/o oral candidiasis treated with fluconazole and recent use of Prevacid for reflux x1 week.     Assessment / Plan / Recommendation Clinical Impression  Pt's appears with gross amount of posterior oral coating c/w candidiasis - on hard and soft palate.   He was not aware of this until SLP requested he examine it using his Ipad.  Pt passed 3 ounce water test but he was noted to significantly piecemeal last bolus - stating this is a new phenomenon.  Suspect pt's dysphagia concerns are due to candidiasis - uncertain to extent of this - concerning for pharyngeal/? esophageal component- ? if he could have candidia esophagitis that could cause his cough post intake.  Recommend pt continue diet as tolerated with treatment of candidiasis.  No SLP follow up indicated.    SLP Visit Diagnosis: Dysphagia, unspecified (R13.10)    Aspiration Risk  No limitations    Diet Recommendation Regular;Thin liquid   Liquid Administration via: Cup;Straw Medication Administration: Whole meds with liquid Supervision: Patient able to self feed Compensations: Slow rate;Small sips/bites(use liquids to help transit solids) Postural Changes: Seated upright at 90 degrees;Remain upright for at least 30 minutes after po intake    Other  Recommendations Oral Care  Recommendations: Oral care BID   Follow up Recommendations None      Frequency and Duration     n/a       Prognosis    n/a    Swallow Study   General Date of Onset: 10/24/18 HPI: 37 yo male with recent diagnosis of HIV, [facility].  Dr Maryfrances Bunnellanford order swallow evaluation as he states pt coughing after intake.  Pt states he feels like food just builds up in his mouth and he has to drink liquids to clear.  Pt reports h/o oral candidiasis treated with fluconazole and recent use of Prevacid for reflux x1 week.   Type of Study: Bedside Swallow Evaluation Previous Swallow Assessment: none Diet Prior to this Study: Regular;Thin liquids Respiratory Status: Room air History of Recent Intubation: No Behavior/Cognition: Alert;Cooperative Oral Cavity Assessment: Other (comment);Lesions(yeast covering posterior palate) Oral Cavity - Dentition: Adequate natural dentition Vision: Functional for self-feeding Self-Feeding Abilities: Able to feed self Patient Positioning: Upright in bed Baseline Vocal Quality: Normal;Hoarse;Other (comment)(? if due to pt's candidiasis and/or coughing due to pna) Volitional Cough: Strong Volitional Swallow: Able to elicit    Oral/Motor/Sensory Function Overall Oral Motor/Sensory Function: Within functional limits   Ice Chips Ice chips: Not tested   Thin Liquid Thin Liquid: Within functional limits Presentation: Cup;Self Fed Other Comments: 3 ounce water test    Nectar Thick Nectar Thick Liquid: Not tested   Honey Thick Honey Thick Liquid: Not tested   Puree Puree: Not tested   Solid     Solid: Within functional limits Other Comments: pt reports it is "building"  in his mouth - use of liquids facilitate clearance      Chales Abrahams 10/24/2018,10:57 AM   Donavan Burnet, MS [facility] SLP Acute Rehab Services Pager [phone] Office [phone]

## 2018-10-24 NOTE — Progress Notes (Signed)
Patient c/o of oral pain, noted thrush inside mouth, MD made aware via text page

## 2018-10-24 NOTE — Progress Notes (Addendum)
Pharmacy - Brief Note  [facility]  To store home med in main pharmacy until time of discharge as per policy.  RN aware.   - Patient aware he was approved and medication will be given to him at time of discharge - medication counseling re: biktarvy provided to patient,  Questions answered to his satisfaction  Thanks, Juliette Alcideustin Zeigler, PharmD, BCPS.   Work Cell: [phone] 10/24/2018 4:27 PM

## 2018-10-24 NOTE — Evaluation (Signed)
Physical Therapy Evaluation Patient Details Name: Alex Mitchell MRN: [id number] DOB: 15-Aug-1981 Today's Date: 10/24/2018   SATURATION QUALIFICATIONS: (This note is used to comply with regulatory documentation for home oxygen)  Patient Saturations on 2L at Rest = 90%  Patient Saturations on Room Air while Ambulating = n/a  Patient Saturations on 4 Liters of oxygen while Ambulating = 81%     History of Present Illness  37 yo male admitted with atypical pna, sinus tachycardia, neutropenia, leukopenia, new HIV diagnosis.  Clinical Impression  On eval, pt required Min guard-Min assist for mobility. He was able to take several short distance walks within his room on today. Pt was able to walk ~15-20 feet before becoming dyspneic and sats dropped, well below 90% (see above). With rest, able to return pt to 2L [place]-sats 90%, at end of session. Pt is also generally weak. Will continue to follow and progress activity as tolerated.     Follow Up Recommendations Home health PT;Supervision for mobility/OOB    Equipment Recommendations  (4 wheeled rolling walker)    Recommendations for Other Services       Precautions / Restrictions Precautions Precautions: Fall Precaution Comments: monitor O2 and HR.  Restrictions Weight Bearing Restrictions: No      Mobility  Bed Mobility Overal bed mobility: Modified Independent                Transfers Overall transfer level: Needs assistance   Transfers: Sit to/from Stand Sit to Stand: Min guard;From elevated surface         General transfer comment: Close guard for safety. Cues for safety, pacing  Ambulation/Gait Ambulation/Gait assistance: Min assist;Min guard Gait Distance (Feet): 20 Feet(20'x1, 15'x2) Assistive device: IV Pole Gait Pattern/deviations: Step-through pattern;Decreased stride length     General Gait Details: Intermittent assist to steady but mostly Min gaurd assist. Cues for pacing, pursed lip/deep breathing.  O2 sat 81% on 4L [place] during ambulation. Seated rest breaks between short walks. Pt becomes dyspneic and fatigues easily/quickly  Stairs            Wheelchair Mobility    Modified Rankin (Stroke Patients Only)       Balance Overall balance assessment: Mild deficits observed, not formally tested           Standing balance-Leahy Scale: Fair                               Pertinent Vitals/Pain Pain Assessment: No/denies pain    Home Living Family/patient expects to be discharged to:: Private residence Living Arrangements: Other relatives(roommate) Available Help at Discharge: Friend(s) Type of Home: House Home Access: Stairs to enter Entrance Stairs-Rails: None Entrance Stairs-Number of Steps: 3 Home Layout: One level Home Equipment: None      Prior Function Level of Independence: Independent               Hand Dominance        Extremity/Trunk Assessment   Upper Extremity Assessment Upper Extremity Assessment: Overall WFL for tasks assessed    Lower Extremity Assessment Lower Extremity Assessment: Generalized weakness    Cervical / Trunk Assessment Cervical / Trunk Assessment: Normal  Communication   Communication: No difficulties  Cognition Arousal/Alertness: Awake/alert Behavior During Therapy: WFL for tasks assessed/performed Overall Cognitive Status: Within Functional Limits for tasks assessed  General Comments      Exercises     Assessment/Plan    PT Assessment Patient needs continued PT services  PT Problem List Decreased strength;Decreased mobility;Decreased activity tolerance;Decreased balance;Decreased knowledge of use of DME       PT Treatment Interventions DME instruction;Gait training;Therapeutic activities;Therapeutic exercise;Functional mobility training;Balance training    PT Goals (Current goals can be found in the Care Plan section)  Acute Rehab PT  Goals Patient Stated Goal: to get better/breathe better PT Goal Formulation: With patient Time For Goal Achievement: 11/07/18 Potential to Achieve Goals: Good    Frequency Min 3X/week   Barriers to discharge        Co-evaluation               AM-PAC PT "6 Clicks" Mobility  Outcome Measure Help needed turning from your back to your side while in a flat bed without using bedrails?: None Help needed moving from lying on your back to sitting on the side of a flat bed without using bedrails?: None Help needed moving to and from a bed to a chair (including a wheelchair)?: A Little Help needed standing up from a chair using your arms (e.g., wheelchair or bedside chair)?: A Little Help needed to walk in hospital room?: A Little Help needed climbing 3-5 steps with a railing? : A Lot 6 Click Score: 19    End of Session Equipment Utilized During Treatment: Oxygen Activity Tolerance: Patient limited by fatigue Patient left: in chair;with call bell/phone within reach   PT Visit Diagnosis: Unsteadiness on feet (R26.81);Muscle weakness (generalized) (M62.81);Difficulty in walking, not elsewhere classified (R26.2)    Time: [id number] PT Time Calculation (min) (ACUTE ONLY): 32 min   Charges:   PT Evaluation $PT Eval Moderate Complexity: 1 Mod PT Treatments $Gait Training: 8-22 mins          Rebeca AlertJannie Kamari Buch, PT Acute Rehabilitation Services Pager: [phone] Office: [phone]

## 2018-10-25 LAB — BASIC METABOLIC PANEL
Anion gap: 9 (ref 5–15)
BUN: 21 mg/dL — ABNORMAL HIGH (ref 6–20)
CALCIUM: 9.2 mg/dL (ref 8.9–10.3)
CO2: 20 mmol/L — AB (ref 22–32)
Chloride: 101 mmol/L (ref 98–111)
Creatinine, Ser: 1.08 mg/dL (ref 0.61–1.24)
GFR calc Af Amer: >60 mL/min (ref >60)
GFR calc non Af Amer: >60 mL/min (ref >60)
Glucose, Bld: 108 mg/dL — ABNORMAL HIGH (ref 70–99)
Potassium: 5.2 mmol/L — ABNORMAL HIGH (ref 3.5–5.1)
Sodium: 130 mmol/L — ABNORMAL LOW (ref 135–145)

## 2018-10-25 MED ORDER — MAGIC MOUTHWASH
5.0000 mL | Freq: Two times a day (BID) | ORAL | Status: DC
  Administered 2018-10-25 – 2018-10-29 (×9): 5 mL via ORAL
  Filled 2018-10-25 (×9): qty 5

## 2018-10-25 NOTE — Progress Notes (Signed)
PROGRESS NOTE    Alex Mitchell  WUJ:[id number] DOB: 01-Aug-1981 DOA: 10/20/2018 PCP: Patient, No Pcp Per      Brief Narrative:  Alex Mitchell is a 37 y.o. M with no sig PMHx who presents with dyspnea, found to have sepsis, CXR showed multifocal pneumonia, also leukopenia and anemia.  Admitted and started on empiric antibiotics for pneumonia.      Assessment & Plan:  Respiratory failure with hypoxia from Adenovirus pneumonia and PJP pneumonia -Wean O2 as able   Sepsis presumed from pneumonia in setting of newly diagnosed HIV Admitted with fever, tachypnea, tachycardia.  CXR shows pneumonia, suspect PJP in addition to Adeno.  Blood culture  no growth.  -Continue Bactrim -Continue steroids to taper to once daily Dec 10 and finish Dec 27  Thrush -Continue fluconazole 2 weeks to end Dec 20  HIV, new diagnosis -Consult ID, appreciate expert guidance -Continue new Biktarvy -SW and RPh involved for post-discharge follow up, med assist  Hyperkalemia Transitory, from Bactrim, expect to resolve. -Daily BMP  Anemia Normocytic, hypoproliferative. From HIV.  Ferritin elevated, but iron sat very low.  Schistocytes on smear per lab, but renal function normal, mentation normal, no suspicion for TTP.  RI low.  haptoGlobin high.  Coombs negative. -Continue iron  Leukopenia From HIV  Transaminitis  Elevated troponin Demand supply mismatch in setting of severe hypoxic respiratory failure.     MDM and disposition: The below labs and imaging reports were reviewed and summarized above.  Medication management as above  The patient was admitted with a severe acute hypoxic respiratory failure, severe illness with threat to life, bowel function.  He is still on supplemental oxygen, requiring IV antibiotics, with a new diagnosis of HIV.  Will continue to wean O2, steroids and IV bactrim.  HOpefully home in 2-3 days.       DVT prophylaxis: SCDs Code Status: FULL Family Communication:  Mother at bedside    Consultants:   Infectious disease  Procedures:   None  Antimicrobials:   Azithromycin x1 12/2  Cefepime 12/2 >> 12/3  Vancomycin x1 12/2  Ceftriaxone 12/2 >> 12/5  Bactrim 12/2 >>  FLuconazole 12/6 >>       Subjective: In better spirits today.  More energy.  No new fever.  Cough with white frothy sputum persists, dyspnea with exertion, but improving.  No chest discomfort, vomiting, confusion.  Sore throat is moderate.  No new rashes.     Objective: Vitals:   10/24/18 2157 10/25/18 0604 10/25/18 1300 10/25/18 1621  BP: 133/75 128/68 132/78   Pulse: 89 89 80 88  Resp: 16 16 18    Temp: 99.9 F (37.7 C) 99 F (37.2 C) 98.9 F (37.2 C)   TempSrc: Oral Oral Oral   SpO2: 94% 91% (!) 88% (!) 88%  Weight:      Height:        Intake/Output Summary (Last 24 hours) at 10/25/2018 1832 Last data filed at 10/25/2018 1800 Gross per 24 hour  Intake 2470.41 ml  Output 3250 ml  Net -779.59 ml   Filed Weights   10/20/18 2043 10/20/18 2052 10/20/18 2211  Weight: 98.9 kg 98.9 kg 127 kg    Examination: General appearance: Well-nourished adult male, sitting up in bed, interactive, no acute distress. HEENT: Anicteric, conjunctival pink, lids and lashes normal.  No nasal deformity, discharge, or epistaxis.  Lips moist, dentition normal.  Circumscribed white plaques in the posterior pharynx with surrounding erythema.  Oropharynx moist.  Hearing normal. Skin: Skin  warm and dry, no suspicious rashes or lesions. Cardiac: Heart rate regular, no murmurs, no lower extremity edema Respiratory: Respiratory rate normal, dyspneic with ambulation, lung sounds diminished, crackles bilaterally. Abdomen: Soft without tenderness to palpation or guarding.  No ascites or distention or hepatosplenomegaly. MSK: No deformities or effusions of the large joints of the upper lower extremities bilaterally.  Normal muscle bulk and tone.   Neuro: Awake and alert, extraocular  movements intact, speech fluent, moves all extremities with normal strength and coordination.    Psych: Sensorium intact and responding to questions, intention normal, affect normal.  Judgment and insight appear normal.     Data Reviewed: I have personally reviewed following labs and imaging studies:  CBC: Recent Labs  Lab 10/20/18 2211 10/21/18 0315 10/23/18 0426 10/24/18 0557  WBC 2.1* 2.5* 2.1* 2.9*  NEUTROABS 0.7* 1.0*  --   --   HGB 9.3* 8.6* 8.5* 8.7*  HCT 29.6* 28.3* 27.6* 27.7*  MCV 88.9 91.0 88.2 89.1  PLT 383 338 313 391   Basic Metabolic Panel: Recent Labs  Lab 10/20/18 2211 10/21/18 0315 10/23/18 0426 10/24/18 0557 10/25/18 0416  NA 135 136 135 133* 130*  K 3.9 4.1 4.2 5.0 5.2*  CL 99 102 103 102 101  CO2 25 24 22  19* 20*  GLUCOSE 116* 96 89 118* 108*  BUN 17 14 11 17  21*  CREATININE 1.11 1.13 1.17 1.16 1.08  CALCIUM 9.2 8.2* 8.8* 8.8* 9.2  MG  --  1.6*  --   --   --   PHOS  --  4.3  --   --   --    GFR: Estimated Creatinine Clearance: 121.6 mL/min (by C-G formula based on SCr of 1.08 mg/dL). Liver Function Tests: Recent Labs  Lab 10/20/18 2211 10/21/18 0315  AST 104* 93*  ALT 91* 83*  ALKPHOS 146* 132*  BILITOT 0.8 0.5  PROT 7.8 6.7  ALBUMIN 3.2* 2.6*   No results for input(s): LIPASE, AMYLASE in the last 168 hours. No results for input(s): AMMONIA in the last 168 hours. Coagulation Profile: Recent Labs  Lab 10/21/18 0315  INR 1.25   Cardiac Enzymes: Recent Labs  Lab 10/21/18 0315  CKTOTAL 35*  TROPONINI 0.03*   BNP (last 3 results) No results for input(s): PROBNP in the last 8760 hours. HbA1C: No results for input(s): HGBA1C in the last 72 hours. CBG: No results for input(s): GLUCAP in the last 168 hours. Lipid Profile: No results for input(s): CHOL, HDL, LDLCALC, TRIG, CHOLHDL, LDLDIRECT in the last 72 hours. Thyroid Function Tests: No results for input(s): TSH, T4TOTAL, FREET4, T3FREE, THYROIDAB in the last 72  hours. Anemia Panel: No results for input(s): VITAMINB12, FOLATE, FERRITIN, TIBC, IRON, RETICCTPCT in the last 72 hours. Urine analysis:    Component Value Date/Time   COLORURINE YELLOW 10/20/2018 2211   APPEARANCEUR CLEAR 10/20/2018 2211   LABSPEC 1.016 10/20/2018 2211   PHURINE 6.0 10/20/2018 2211   GLUCOSEU NEGATIVE 10/20/2018 2211   HGBUR NEGATIVE 10/20/2018 2211   BILIRUBINUR NEGATIVE 10/20/2018 2211   KETONESUR 5 (A) 10/20/2018 2211   PROTEINUR 100 (A) 10/20/2018 2211   NITRITE NEGATIVE 10/20/2018 2211   LEUKOCYTESUR NEGATIVE 10/20/2018 2211   Sepsis Labs: @LABRCNTIP (procalcitonin:4,lacticacidven:4)  ) Recent Results (from the past 240 hour(s))  Blood Culture (routine x 2)     Status: None (Preliminary result)   Collection Time: 10/20/18 10:11 PM  Result Value Ref Range Status   Specimen Description   Final    BLOOD  RIGHT ANTECUBITAL Performed at [facility], [address] [place], [place] [address]    Special Requests   Final    BOTTLES DRAWN AEROBIC AND ANAEROBIC Blood Culture adequate volume Performed at [facility], [address] [place], [place] [address]    Culture   Final    NO GROWTH 4 DAYS Performed at [facility], [address] [place], [place] [address]    Report Status PENDING  Incomplete  Blood Culture (routine x 2)     Status: None (Preliminary result)   Collection Time: 10/20/18 10:11 PM  Result Value Ref Range Status   Specimen Description   Final    BLOOD LEFT ANTECUBITAL Performed at [facility], [address] [place], [place] [address]    Special Requests   Final    BOTTLES DRAWN AEROBIC AND ANAEROBIC Blood Culture adequate volume Performed at [facility], [address] [place], [place] [address]    Culture   Final    NO GROWTH 4 DAYS Performed at [facility], [address] [place], [place] [address]    Report Status PENDING   Incomplete  Respiratory Panel by PCR     Status: Abnormal   Collection Time: 10/20/18 11:45 PM  Result Value Ref Range Status   Adenovirus DETECTED (A) NOT DETECTED Final   Coronavirus 229E NOT DETECTED NOT DETECTED Final   Coronavirus HKU1 NOT DETECTED NOT DETECTED Final   Coronavirus NL63 NOT DETECTED NOT DETECTED Final   Coronavirus OC43 NOT DETECTED NOT DETECTED Final   Metapneumovirus NOT DETECTED NOT DETECTED Final   Rhinovirus / Enterovirus NOT DETECTED NOT DETECTED Final   Influenza A NOT DETECTED NOT DETECTED Final   Influenza B NOT DETECTED NOT DETECTED Final   Parainfluenza Virus 1 NOT DETECTED NOT DETECTED Final   Parainfluenza Virus 2 NOT DETECTED NOT DETECTED Final   Parainfluenza Virus 3 NOT DETECTED NOT DETECTED Final   Parainfluenza Virus 4 NOT DETECTED NOT DETECTED Final   Respiratory Syncytial Virus NOT DETECTED NOT DETECTED Final   Bordetella pertussis NOT DETECTED NOT DETECTED Final   Chlamydophila pneumoniae NOT DETECTED NOT DETECTED Final   Mycoplasma pneumoniae NOT DETECTED NOT DETECTED Final    Comment: Performed at [facility], [address] [place], [place] [address]  MRSA PCR Screening     Status: None   Collection Time: 10/21/18  1:50 AM  Result Value Ref Range Status   MRSA by PCR NEGATIVE NEGATIVE Final    Comment:        The GeneXpert MRSA Assay (FDA approved for NASAL specimens only), is one component of a comprehensive MRSA colonization surveillance program. It is not intended to diagnose MRSA infection nor to guide or monitor treatment for MRSA infections. Performed at [facility], [address] [place], [place] [address]   Pneumocystis smear by DFA     Status: None   Collection Time: 10/21/18  4:24 AM  Result Value Ref Range Status   Specimen Source-PJSRC SPUTUM  Final   Pneumocystis jiroveci Ag NEGATIVE  Final    Comment: Performed at [facility] Performed at [facility] Performed  at [facility], [address] [place], [place] [address]   Culture, sputum-assessment     Status: None   Collection Time: 10/21/18  4:27 AM  Result Value Ref Range Status   Specimen Description SPUTUM  Final  Special Requests Immunocompromised  Final   Sputum evaluation   Final    Sputum specimen not acceptable for testing.  Please recollect.   INFORMED KAITLYN @0508  ON 12.3.19 BY [facility] Performed at [facility], [address] [place], [place] [address]    Report Status 10/21/2018 FINAL  Final         Radiology Studies: No results found.      Scheduled Meds: . bictegravir-emtricitabine-tenofovir AF  1 tablet Oral Daily  . enoxaparin (LOVENOX) injection  40 mg Subcutaneous Q24H  . ferrous sulfate  325 mg Oral QODAY  . guaiFENesin  600 mg Oral BID  . magic mouthwash  5 mL Oral BID  . mouth rinse  15 mL Mouth Rinse BID  . pantoprazole  40 mg Oral Daily  . predniSONE  40 mg Oral BID WC   Continuous Infusions: . sodium chloride Stopped (10/24/18 0600)  . fluconazole (DIFLUCAN) IV Stopped (10/24/18 2358)  . sulfamethoxazole-trimethoprim 480 mg (10/25/18 1336)     LOS: 5 days    Time spent: 35 minutes    Alberteen Sam, MD [facility] 10/25/2018, 6:32 PM     Please page through AMION:  [email or web address] If 7PM-7AM, please contact night-coverage

## 2018-10-25 NOTE — Progress Notes (Addendum)
[facility]   Reason for visit: Follow up on HIV, pneumonia  Interval History: feels much better, some chest tightness with activity but less cough.  Cough mainly with productive sputum.  Improved po intake.  On O2 this am.  No fever, no chills.   Physical Exam: Constitutional:  Vitals:   10/24/18 2157 10/25/18 0604  BP: 133/75 128/68  Pulse: 89 89  Resp: 16 16  Temp: 99.9 F (37.7 C) 99 F (37.2 C)  SpO2: 94% 91%   patient appears in NAD Eyes: anicteric HENT: +pharyngeal thrush Respiratory: Normal respiratory effort; CTA B Cardiovascular: RRR GI: soft, nt, nd  Review of Systems: Constitutional: negative for fevers, chills and anorexia Respiratory: positive for cough or sputum Gastrointestinal: negative for nausea Integument/breast: negative for rash  Lab Results  Component Value Date   WBC 2.9 (L) 10/24/2018   HGB 8.7 (L) 10/24/2018   HCT 27.7 (L) 10/24/2018   MCV 89.1 10/24/2018   PLT 391 10/24/2018    Lab Results  Component Value Date   CREATININE 1.08 10/25/2018   BUN 21 (H) 10/25/2018   NA 130 (L) 10/25/2018   K 5.2 (H) 10/25/2018   CL 101 10/25/2018   CO2 20 (L) 10/25/2018    Lab Results  Component Value Date   ALT 83 (H) 10/21/2018   AST 93 (H) 10/21/2018   ALKPHOS 132 (H) 10/21/2018     Microbiology: Recent Results (from the past 240 hour(s))  Blood Culture (routine x 2)     Status: None (Preliminary result)   Collection Time: 10/20/18 10:11 PM  Result Value Ref Range Status   Specimen Description   Final    BLOOD RIGHT ANTECUBITAL Performed at [facility], [address], [place], [place] [address]    Special Requests   Final    BOTTLES DRAWN AEROBIC AND ANAEROBIC Blood Culture adequate volume Performed at [facility], [address], [place], [place] [address]    Culture   Final    NO GROWTH 3 DAYS Performed at [facility], [address], [place], [place]  [address]    Report Status PENDING  Incomplete  Blood Culture (routine x 2)     Status: None (Preliminary result)   Collection Time: 10/20/18 10:11 PM  Result Value Ref Range Status   Specimen Description   Final    BLOOD LEFT ANTECUBITAL Performed at [facility], [address] [place], [place] [address]    Special Requests   Final    BOTTLES DRAWN AEROBIC AND ANAEROBIC Blood Culture adequate volume Performed at [facility], [address] [place], [place] [address]    Culture   Final    NO GROWTH 3 DAYS Performed at [facility], [address] [place], [place] [address]    Report Status PENDING  Incomplete  Respiratory Panel by PCR     Status: Abnormal   Collection Time: 10/20/18 11:45 PM  Result Value Ref Range Status   Adenovirus DETECTED (A) NOT DETECTED Final   Coronavirus 229E NOT DETECTED NOT DETECTED Final   Coronavirus HKU1 NOT DETECTED NOT DETECTED Final   Coronavirus NL63 NOT DETECTED NOT DETECTED Final   Coronavirus OC43 NOT DETECTED NOT DETECTED Final   Metapneumovirus NOT DETECTED NOT DETECTED Final   Rhinovirus / Enterovirus NOT DETECTED NOT DETECTED Final   Influenza A NOT DETECTED NOT DETECTED Final   Influenza B NOT DETECTED NOT  DETECTED Final   Parainfluenza Virus 1 NOT DETECTED NOT DETECTED Final   Parainfluenza Virus 2 NOT DETECTED NOT DETECTED Final   Parainfluenza Virus 3 NOT DETECTED NOT DETECTED Final   Parainfluenza Virus 4 NOT DETECTED NOT DETECTED Final   Respiratory Syncytial Virus NOT DETECTED NOT DETECTED Final   Bordetella pertussis NOT DETECTED NOT DETECTED Final   Chlamydophila pneumoniae NOT DETECTED NOT DETECTED Final   Mycoplasma pneumoniae NOT DETECTED NOT DETECTED Final    Comment: Performed at [facility], [address] [place], [place] [address]  MRSA PCR Screening     Status: None   Collection Time: 10/21/18  1:50 AM  Result Value Ref Range Status   MRSA by PCR NEGATIVE  NEGATIVE Final    Comment:        The GeneXpert MRSA Assay (FDA approved for NASAL specimens only), is one component of a comprehensive MRSA colonization surveillance program. It is not intended to diagnose MRSA infection nor to guide or monitor treatment for MRSA infections. Performed at [facility], [address] [place], [place] [address]   Pneumocystis smear by DFA     Status: None   Collection Time: 10/21/18  4:24 AM  Result Value Ref Range Status   Specimen Source-PJSRC SPUTUM  Final   Pneumocystis jiroveci Ag NEGATIVE  Final    Comment: Performed at [facility] Performed at [facility] Performed at [facility], [address] [place], [place] [address]   Culture, sputum-assessment     Status: None   Collection Time: 10/21/18  4:27 AM  Result Value Ref Range Status   Specimen Description SPUTUM  Final   Special Requests Immunocompromised  Final   Sputum evaluation   Final    Sputum specimen not acceptable for testing.  Please recollect.   INFORMED KAITLYN @0508  ON 12.3.19 BY [facility] Performed at [facility], [address] [place], [place] [address]    Report Status 10/21/2018 FINAL  Final    Impression/Plan:  1. Multifocal pneumonia - adenovirus positive and certainly seems most likely cause of his pneumonia.  However, with his CD4 of just 6, hypoxia, I am still concerned with the possibility of PJP pneumonia, despite a negative DFA as it carries a high mortality if untreated.  I would continue with this treatment for 21 days with steroids. With this possibility as well, I would continue to observe him here for another 24-48 hours to be sure he remains stable before consideration of discharge.   2.thrush - on fluconazole and can take orally for 14 days total for presumed esophageal thrush.    3.  HIV - on Biktarvy and has a supply from pharmacy  At discharge Patients mother and  brother at bedside not aware of his HIV status.   He has follow up with us arranged   4.  Hyperkalemia - elevated a bit more to 5.2.  I am hopeful to continue Bactrim if possible but will need to continue to monitor closely.

## 2018-10-26 LAB — BASIC METABOLIC PANEL
Anion gap: 10 (ref 5–15)
BUN: 25 mg/dL — ABNORMAL HIGH (ref 6–20)
CHLORIDE: 102 mmol/L (ref 98–111)
CO2: 19 mmol/L — ABNORMAL LOW (ref 22–32)
Calcium: 9.6 mg/dL (ref 8.9–10.3)
Creatinine, Ser: 1.21 mg/dL (ref 0.61–1.24)
GFR calc Af Amer: >60 mL/min (ref >60)
GFR calc non Af Amer: >60 mL/min (ref >60)
Glucose, Bld: 105 mg/dL — ABNORMAL HIGH (ref 70–99)
Potassium: 5.9 mmol/L — ABNORMAL HIGH (ref 3.5–5.1)
Sodium: 131 mmol/L — ABNORMAL LOW (ref 135–145)

## 2018-10-26 LAB — CULTURE, BLOOD (ROUTINE X 2)
Culture: NO GROWTH
Culture: NO GROWTH
SPECIAL REQUESTS: ADEQUATE
SPECIAL REQUESTS: ADEQUATE

## 2018-10-26 LAB — CBC
HEMATOCRIT: 28.6 % — AB (ref 39.0–52.0)
Hemoglobin: 9.1 g/dL — ABNORMAL LOW (ref 13.0–17.0)
MCH: 28 pg (ref 26.0–34.0)
MCHC: 31.8 g/dL (ref 30.0–36.0)
MCV: 88 fL (ref 80.0–100.0)
Platelets: 461 10*3/uL — ABNORMAL HIGH (ref 150–400)
RBC: 3.25 MIL/uL — ABNORMAL LOW (ref 4.22–5.81)
RDW: 15.6 % — ABNORMAL HIGH (ref 11.5–15.5)
WBC: 4.3 10*3/uL (ref 4.0–10.5)
nRBC: 0 % (ref 0.0–0.2)

## 2018-10-26 MED ORDER — SODIUM POLYSTYRENE SULFONATE 15 GM/60ML PO SUSP
30.0000 g | Freq: Once | ORAL | Status: AC
  Administered 2018-10-26: 30 g via ORAL
  Filled 2018-10-26: qty 120

## 2018-10-26 NOTE — Progress Notes (Signed)
Pharmacy Antibiotic Note  Alex Mitchell is a 37 y.o. male admitted on 10/20/2018 with pneumonia (suspect PJP with new HIV diagnosis).  Pharmacy has been consulted for TMP/SMZ IV dosing. Patient currently on PO TMP/SMZ  Today, 10/26/2018 - Day #4   Tmax 99  WBC 4.3  Renal: SCr rising - small increase could be due to TMP (not direct injury but expected small rise d/t TMP inhibiting SCr secretion)  K+: increased to 5.9 - discussed with ID and MD, giving kayexalate 30g PO x 1  Plan:  Continue TMP/SMZ 480mg  IV q8h (16mg /kg/day per adjusted BW)  Monitor renal function and K+   Height: 5\' 8"  (172.7 cm) Weight: 280 lb (127 kg) IBW/kg (Calculated) : 68.4  Temp (24hrs), Avg:98.5 F (36.9 C), Min:98.2 F (36.8 C), Max:98.9 F (37.2 C)  Recent Labs  Lab 10/20/18 2211 10/20/18 2219 10/21/18 0315 10/23/18 0426 10/24/18 0557 10/25/18 0416 10/26/18 0412  WBC 2.1*  --  2.5* 2.1* 2.9*  --  4.3  CREATININE 1.11  --  1.13 1.17 1.16 1.08 1.21  LATICACIDVEN  --  1.02 0.9  --   --   --   --     Estimated Creatinine Clearance: 108.5 mL/min (by C-G formula based on SCr of 1.21 mg/dL).    No Known Allergies  Antimicrobials this admission:  12/2 CTX/Azith x1 12/3 Vancomycin >>12/3 12/3 Cefepime >>12/3 12/3 Ceftriaxone >> 12/5 12/3 Septra (po) >> 12/5 12/5 IV TMP/SMZ >> 12/6 Fluconazole >>  Dose adjustments this admission:   Microbiology results:  12/2 BCx: NGTD 12/2 Resp panel: + for Adenovirus 12/3 Sputum: not acceptable  12/3 MRSA PCR: neg 12/3 Strep pneumo/Legionella: neg/neg 12/3 Pneumocystis: neg 12/3 HIV RNA quant: 770K 12/3 HIV ab: 1 (+), 2 (-) 12/3 Hepatitis panel: neg  Thank you for allowing pharmacy to be a part of this patient's care.  Loralee PacasErin Evora Schechter, PharmD, BCPS Pager: [phone] 10/26/2018 10:30 AM

## 2018-10-26 NOTE — Progress Notes (Addendum)
Pt attempted ambulating in room without wearing oxygen. Oxygen dropped to 85 % on room air. 1 L oxygen applied and pt returned to 88 % and then 91 % when back in bed

## 2018-10-26 NOTE — Progress Notes (Addendum)
Patient was given Facilities managerncentive Spirometer, patient successfully reached 750. Patient was instructed to practice 10x an hour.   While sitting in bed and on room air patients O2 was 84. Oxygen at 1L brought patients O2 up to 89 while sitting.  Will continue to monitor patients O2 stats throughout the day.  Patient did not progress off of 1L of O2 during day shift.

## 2018-10-26 NOTE — Progress Notes (Signed)
PROGRESS NOTE    Yale Golla  ZOX:[id number] DOB: 1981/09/06 DOA: 10/20/2018 PCP: Patient, No Pcp Per      Brief Narrative:  Mr. Rewerts is a 37 y.o. M with no sig PMHx who presents with dyspnea, found to have sepsis, CXR showed multifocal pneumonia, also leukopenia and anemia.  Admitted and started on empiric antibiotics for pneumonia.      Assessment & Plan:  Respiratory failure with hypoxia from Adenovirus pneumonia and PJP pneumonia O2 sats in 80s at rest on room air. -Continue Bactrim, add IS, wean O2 as able   Sepsis presumed from pneumonia in setting of newly diagnosed HIV Admitted with fever, tachypnea, tachycardia. Adenovirus on RVP. CXR showed bilateral opacities, suspect PJP in addition to Adeno.  Blood cultures no growth.  -Continue IV Bactrim, dsicussed with RPh -Continue prednisone to taper to once daily Dec 10 and finish Dec 27  Thrush Improved  -Continue fluconazole 2 weeks to end Dec 20  HIV, new diagnosis -Consult ID, appreciate expert guidance -Continue new Biktarvy -SW and RPh involved for post-discharge follow up, med assist  Hyperkalemia Worsening today.  We expect this to resolve, but will obtain G6PD in case Bactrim must be stopped. -Kayexalate 30g once -Daily BMP  Anemia Normocytic, hypoproliferative. From HIV.  Ferritin elevated, but iron sat very low.  Schistocytes on smear per lab, but renal function normal, mentation normal, no suspicion for TTP.  RI low.  haptoGlobin high.  Coombs negative. -Continue iron  Leukopenia From HIV  Transaminitis  Elevated troponin Demand supply mismatch in setting of severe hypoxic respiratory failure.     MDM and disposition: All labs and imaging reports were reviewed and summarized above.  Case discussed with pharmacy and infectious disease.  Medication management as above.  The patient was admitted with a severe acute hypoxic respiratory failure, severe illness with threat to life, bowel  function.  He is still on supplemental oxygen, requiring IV antibiotics, with a new diagnosis of HIV.    He remains persistently hypoxic when at rest.  We will continue IV Bactrim, steroids, close monitoring of potassium.         DVT prophylaxis: SCDs Code Status: FULL Family Communication: Mother at bedside    Consultants:   Infectious disease  Procedures:   None  Antimicrobials:   Azithromycin x1 12/2  Cefepime 12/2 >> 12/3  Vancomycin x1 12/2  Ceftriaxone 12/2 >> 12/5  Bactrim 12/2 >>  FLuconazole 12/6 >>       Subjective: Afebrile.  Cough intermittent.  No dyspnea with exertion.  No chest discomfort.  No new rashes.  Sore throat improving.  No significant dyspnea despite hypoxia.     Objective: Vitals:   10/25/18 2301 10/26/18 0536 10/26/18 1325 10/26/18 1325  BP: (!) 141/80 124/70 134/78   Pulse: 75 81 83 84  Resp: 16 14 17    Temp: 98.3 F (36.8 C) 98.2 F (36.8 C) 98.9 F (37.2 C)   TempSrc: Oral Oral Oral   SpO2: 94% 95% (!) 89% (!) 89%  Weight:      Height:        Intake/Output Summary (Last 24 hours) at 10/26/2018 1604 Last data filed at 10/26/2018 8119 Gross per 24 hour  Intake 2702.21 ml  Output 2650 ml  Net 52.21 ml   Filed Weights   10/20/18 2043 10/20/18 2052 10/20/18 2211  Weight: 98.9 kg 98.9 kg 127 kg    Examination: General appearance: Well-nourished adult male, sitting on edge of bed, interactive, no  acute distress. HEENT: Anicteric, conjunctival pink, lids and lashes normal.  No nasal deformity, discharge, or epistaxis.  Lips moist, dentition normal.  White plaques in the posterior pharynx are gone.  Oropharynx moist.  Hearing normal. Skin: Skin warm and dry, no new suspicious rashes or lesions. Cardiac: RRR, no murmurs, JVP normal, no lower extremity edema. Respiratory: Respiratory rate appears normal, rales bilaterally.  No wheezing. Abdomen: Abdomen soft without tenderness to palpation or guarding.  No ascites or  distention.. MSK: No deformities or effusions of the large joints of the upper lower extremities bilaterally.  Normal muscle bulk and tone.   Neuro: Awake and alert, extraocular movements intact, speech fluent, moves all extremities with normal strength and coordination. Psych: Sensorium intact responding to questions, attention normal, affect and judgment normal..     Data Reviewed: I have personally reviewed following labs and imaging studies:  CBC: Recent Labs  Lab 10/20/18 2211 10/21/18 0315 10/23/18 0426 10/24/18 0557 10/26/18 0412  WBC 2.1* 2.5* 2.1* 2.9* 4.3  NEUTROABS 0.7* 1.0*  --   --   --   HGB 9.3* 8.6* 8.5* 8.7* 9.1*  HCT 29.6* 28.3* 27.6* 27.7* 28.6*  MCV 88.9 91.0 88.2 89.1 88.0  PLT 383 338 313 391 461*   Basic Metabolic Panel: Recent Labs  Lab 10/21/18 0315 10/23/18 0426 10/24/18 0557 10/25/18 0416 10/26/18 0412  NA 136 135 133* 130* 131*  K 4.1 4.2 5.0 5.2* 5.9*  CL 102 103 102 101 102  CO2 24 22 19* 20* 19*  GLUCOSE 96 89 118* 108* 105*  BUN 14 11 17  21* 25*  CREATININE 1.13 1.17 1.16 1.08 1.21  CALCIUM 8.2* 8.8* 8.8* 9.2 9.6  MG 1.6*  --   --   --   --   PHOS 4.3  --   --   --   --    GFR: Estimated Creatinine Clearance: 108.5 mL/min (by C-G formula based on SCr of 1.21 mg/dL). Liver Function Tests: Recent Labs  Lab 10/20/18 2211 10/21/18 0315  AST 104* 93*  ALT 91* 83*  ALKPHOS 146* 132*  BILITOT 0.8 0.5  PROT 7.8 6.7  ALBUMIN 3.2* 2.6*   No results for input(s): LIPASE, AMYLASE in the last 168 hours. No results for input(s): AMMONIA in the last 168 hours. Coagulation Profile: Recent Labs  Lab 10/21/18 0315  INR 1.25   Cardiac Enzymes: Recent Labs  Lab 10/21/18 0315  CKTOTAL 35*  TROPONINI 0.03*   BNP (last 3 results) No results for input(s): PROBNP in the last 8760 hours. HbA1C: No results for input(s): HGBA1C in the last 72 hours. CBG: No results for input(s): GLUCAP in the last 168 hours. Lipid Profile: No results  for input(s): CHOL, HDL, LDLCALC, TRIG, CHOLHDL, LDLDIRECT in the last 72 hours. Thyroid Function Tests: No results for input(s): TSH, T4TOTAL, FREET4, T3FREE, THYROIDAB in the last 72 hours. Anemia Panel: No results for input(s): VITAMINB12, FOLATE, FERRITIN, TIBC, IRON, RETICCTPCT in the last 72 hours. Urine analysis:    Component Value Date/Time   COLORURINE YELLOW 10/20/2018 2211   APPEARANCEUR CLEAR 10/20/2018 2211   LABSPEC 1.016 10/20/2018 2211   PHURINE 6.0 10/20/2018 2211   GLUCOSEU NEGATIVE 10/20/2018 2211   HGBUR NEGATIVE 10/20/2018 2211   BILIRUBINUR NEGATIVE 10/20/2018 2211   KETONESUR 5 (A) 10/20/2018 2211   PROTEINUR 100 (A) 10/20/2018 2211   NITRITE NEGATIVE 10/20/2018 2211   LEUKOCYTESUR NEGATIVE 10/20/2018 2211   Sepsis Labs: @LABRCNTIP (procalcitonin:4,lacticacidven:4)  ) Recent Results (from the past 240  hour(s))  Blood Culture (routine x 2)     Status: None   Collection Time: 10/20/18 10:11 PM  Result Value Ref Range Status   Specimen Description   Final    BLOOD RIGHT ANTECUBITAL Performed at [facility], [address] [place], [place] [address]    Special Requests   Final    BOTTLES DRAWN AEROBIC AND ANAEROBIC Blood Culture adequate volume Performed at [facility], [address], [place], [place] [address]    Culture   Final    NO GROWTH 5 DAYS Performed at [facility], [address] [place], [place] [address]    Report Status 10/26/2018 FINAL  Final  Blood Culture (routine x 2)     Status: None   Collection Time: 10/20/18 10:11 PM  Result Value Ref Range Status   Specimen Description   Final    BLOOD LEFT ANTECUBITAL Performed at [facility], [address], [place], [place] [address]    Special Requests   Final    BOTTLES DRAWN AEROBIC AND ANAEROBIC Blood Culture adequate volume Performed at [facility], [address] [place], [place] [address]     Culture   Final    NO GROWTH 5 DAYS Performed at [facility], [address] [place], [place] [address]    Report Status 10/26/2018 FINAL  Final  Respiratory Panel by PCR     Status: Abnormal   Collection Time: 10/20/18 11:45 PM  Result Value Ref Range Status   Adenovirus DETECTED (A) NOT DETECTED Final   Coronavirus 229E NOT DETECTED NOT DETECTED Final   Coronavirus HKU1 NOT DETECTED NOT DETECTED Final   Coronavirus NL63 NOT DETECTED NOT DETECTED Final   Coronavirus OC43 NOT DETECTED NOT DETECTED Final   Metapneumovirus NOT DETECTED NOT DETECTED Final   Rhinovirus / Enterovirus NOT DETECTED NOT DETECTED Final   Influenza A NOT DETECTED NOT DETECTED Final   Influenza B NOT DETECTED NOT DETECTED Final   Parainfluenza Virus 1 NOT DETECTED NOT DETECTED Final   Parainfluenza Virus 2 NOT DETECTED NOT DETECTED Final   Parainfluenza Virus 3 NOT DETECTED NOT DETECTED Final   Parainfluenza Virus 4 NOT DETECTED NOT DETECTED Final   Respiratory Syncytial Virus NOT DETECTED NOT DETECTED Final   Bordetella pertussis NOT DETECTED NOT DETECTED Final   Chlamydophila pneumoniae NOT DETECTED NOT DETECTED Final   Mycoplasma pneumoniae NOT DETECTED NOT DETECTED Final    Comment: Performed at [facility], [address], [place], [place] [address]  MRSA PCR Screening     Status: None   Collection Time: 10/21/18  1:50 AM  Result Value Ref Range Status   MRSA by PCR NEGATIVE NEGATIVE Final    Comment:        The GeneXpert MRSA Assay (FDA approved for NASAL specimens only), is one component of a comprehensive MRSA colonization surveillance program. It is not intended to diagnose MRSA infection nor to guide or monitor treatment for MRSA infections. Performed at [facility], [address] [place], [place] [address]   Pneumocystis smear by DFA     Status: None   Collection Time: 10/21/18  4:24 AM  Result Value Ref Range Status   Specimen Source-PJSRC SPUTUM   Final   Pneumocystis jiroveci Ag NEGATIVE  Final    Comment: Performed at [facility] Performed at [facility] Performed at [facility], [address] [place], [place] [address]   Culture, sputum-assessment     Status: None   Collection Time: 10/21/18  4:27 AM  Result Value Ref Range Status   Specimen Description SPUTUM  Final   Special Requests Immunocompromised  Final   Sputum evaluation   Final    Sputum specimen not acceptable for testing.  Please recollect.   INFORMED KAITLYN @0508  ON 12.3.19 BY [facility] Performed at [facility], [address] [place], [place] [address]    Report Status 10/21/2018 FINAL  Final         Radiology Studies: No results found.      Scheduled Meds: . bictegravir-emtricitabine-tenofovir AF  1 tablet Oral Daily  . enoxaparin (LOVENOX) injection  40 mg Subcutaneous Q24H  . ferrous sulfate  325 mg Oral QODAY  . guaiFENesin  600 mg Oral BID  . magic mouthwash  5 mL Oral BID  . mouth rinse  15 mL Mouth Rinse BID  . pantoprazole  40 mg Oral Daily  . predniSONE  40 mg Oral BID WC   Continuous Infusions: . sodium chloride 1,000 mL (10/26/18 1338)  . fluconazole (DIFLUCAN) IV Stopped (10/25/18 2246)  . sulfamethoxazole-trimethoprim 480 mg (10/26/18 1227)     LOS: 6 days    Time spent: 25 minutes    Alberteen Samhristopher P Uzziel Russey, MD [facility] 10/26/2018, 4:04 PM     Please page through AMION:  [email or web address] Password [email or web address] If 7PM-7AM, please contact night-coverage

## 2018-10-27 LAB — BASIC METABOLIC PANEL
Anion gap: 10 (ref 5–15)
BUN: 26 mg/dL — ABNORMAL HIGH (ref 6–20)
CO2: 20 mmol/L — ABNORMAL LOW (ref 22–32)
Calcium: 9.7 mg/dL (ref 8.9–10.3)
Chloride: 101 mmol/L (ref 98–111)
Creatinine, Ser: 1.19 mg/dL (ref 0.61–1.24)
GFR calc Af Amer: >60 mL/min (ref >60)
Glucose, Bld: 100 mg/dL — ABNORMAL HIGH (ref 70–99)
Potassium: 4.6 mmol/L (ref 3.5–5.1)
Sodium: 131 mmol/L — ABNORMAL LOW (ref 135–145)

## 2018-10-27 LAB — CBC
HCT: 30.5 % — ABNORMAL LOW (ref 39.0–52.0)
Hemoglobin: 9.3 g/dL — ABNORMAL LOW (ref 13.0–17.0)
MCH: 26.4 pg (ref 26.0–34.0)
MCHC: 30.5 g/dL (ref 30.0–36.0)
MCV: 86.6 fL (ref 80.0–100.0)
Platelets: 614 10*3/uL — ABNORMAL HIGH (ref 150–400)
RBC: 3.52 MIL/uL — ABNORMAL LOW (ref 4.22–5.81)
RDW: 15.6 % — ABNORMAL HIGH (ref 11.5–15.5)
WBC: 5.4 10*3/uL (ref 4.0–10.5)
nRBC: 0 % (ref 0.0–0.2)

## 2018-10-27 LAB — GLUCOSE 6 PHOSPHATE DEHYDROGENASE
G6PDH: 10.3 U/g{Hb} (ref 4.6–13.5)
Hemoglobin: 9.2 g/dL — ABNORMAL LOW (ref 13.0–17.7)

## 2018-10-27 MED ORDER — FLUCONAZOLE 100 MG PO TABS
400.0000 mg | ORAL_TABLET | Freq: Every day | ORAL | Status: DC
  Administered 2018-10-28 – 2018-10-29 (×2): 400 mg via ORAL
  Filled 2018-10-27 (×2): qty 4

## 2018-10-27 MED ORDER — AZITHROMYCIN 600 MG PO TABS
1200.0000 mg | ORAL_TABLET | ORAL | Status: DC
  Administered 2018-10-27: 1200 mg via ORAL
  Filled 2018-10-27: qty 2

## 2018-10-27 MED ORDER — SULFAMETHOXAZOLE-TRIMETHOPRIM 800-160 MG PO TABS
2.0000 | ORAL_TABLET | Freq: Three times a day (TID) | ORAL | Status: DC
  Administered 2018-10-27 – 2018-10-28 (×3): 2 via ORAL
  Filled 2018-10-27 (×3): qty 2

## 2018-10-27 NOTE — Progress Notes (Signed)
[facility]    Date of Admission:  10/20/2018   Total days of antibiotics 9        Day 6 bactrim           ID: Alex Mitchell is a 37 y.o. 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 Afebrile. Was treated for hyperK+ yesterday. Morning labs re improved. He feels much improved ; no fever, no dysphagia  Medications:  . azithromycin  1,200 mg Oral Weekly  . bictegravir-emtricitabine-tenofovir AF  1 tablet Oral Daily  . enoxaparin (LOVENOX) injection  40 mg Subcutaneous Q24H  . ferrous sulfate  325 mg Oral QODAY  . guaiFENesin  600 mg Oral BID  . magic mouthwash  5 mL Oral BID  . mouth rinse  15 mL Mouth Rinse BID  . pantoprazole  40 mg Oral Daily  . predniSONE  40 mg Oral BID WC    Objective: Vital signs in last 24 hours: Temp:  [98 F (36.7 C)-98.8 F (37.1 C)] 98 F (36.7 C) (12/09 0415) Pulse Rate:  [79-87] 79 (12/09 0415) Resp:  [18] 18 (12/09 0415) BP: (144-148)/(61-83) 148/61 (12/09 0415) SpO2:  [90 %-91 %] 90 % (12/09 0415)  Physical Exam  Constitutional: He is oriented to person, place, and time. He appears well-developed and well-nourished. No distress.  HENT:  Mouth/Throat: Oropharynx is clear and moist. No oropharyngeal exudate. +mild thrush Cardiovascular: Normal rate, regular rhythm and normal heart sounds. Exam reveals no gallop and no friction rub.  No murmur heard.  Pulmonary/Chest: Effort normal and breath sounds normal. No respiratory distress. He has no wheezes.  Abdominal: Soft. Bowel sounds are normal. He exhibits no distension. There is no tenderness.  Lymphadenopathy:  He has no cervical adenopathy.    Neurological: He is alert and oriented to person, place, and time.  Skin: Skin is warm and dry. No rash noted. No erythema.  Psychiatric: He has a normal mood and affect. His behavior is normal.      Lab Results Recent Labs    10/26/18 0412 10/27/18 0423  WBC 4.3 5.4  HGB 9.1* 9.3*  HCT 28.6* 30.5*  NA 131* 131*  K 5.9* 4.6  CL 102 101  CO2 19* 20*  BUN 25* 26*  CREATININE 1.21 1.19   Liver Panel No results for input(s): PROT, ALBUMIN, AST, ALT, ALKPHOS, BILITOT, BILIDIR, IBILI in the last 72 hours. Sedimentation Rate No results for input(s): ESRSEDRATE in the last 72 hours. C-Reactive Protein No results for input(s): CRP in the last 72 hours.  Microbiology:  Studies/Results: No results found.   Assessment/Plan: hiv disease= continue on biktarvy, will need to switch with food (since he is also getting iron supplementa)  pjp pneumonia = not sure if he will tolerate full course of bactrim without getting hyperkalemia. Today's labs are improved. Will switch to orals and see if serum potassium stays in the normal range. Has steroid taper starting tomorrow.  Oral candidiasis/eso candida = will switch to oral fluconazole  oi proph = we started once a week azithro 1200mg  q mondays  Let me check with clinic outreach SW tomorrow to help with access to  meds. He may need match program for the remaining PCP tx and azithromycin and steroids for rest of the course of treatment. He already has biktarvy awaiting in pharmacy  [facility] Cell: [phone] Pager: [phone]  10/27/2018, 3:03 PM

## 2018-10-27 NOTE — Progress Notes (Signed)
Physical Therapy Treatment Patient Details Name: Alex Mitchell MRN: [id number] DOB: 1981-05-27 Today's Date: 10/27/2018    History of Present Illness 37 yo male admitted with atypical pna, sinus tachycardia, neutropenia, leukopenia, new HIV diagnosis.    PT Comments    Progressing with mobility and activity tolerance. O2 sat 90% on 1L [place] at rest; 92% on 4L [place] during ambulation; 88% on 3L [place] during ambulation. Dyspnea 2-3/4. Will continue to follow.    Follow Up Recommendations  Home health PT;Supervision for mobility/OOB     Equipment Recommendations  (continuing to assess. may need 4 wheeled walker)    Recommendations for Other Services       Precautions / Restrictions Precautions Precautions: Fall Precaution Comments: monitor O2 and HR.  Restrictions Weight Bearing Restrictions: No    Mobility  Bed Mobility Overal bed mobility: Modified Independent                Transfers Overall transfer level: Needs assistance Equipment used: 4-wheeled walker;None Transfers: Sit to/from Stand Sit to Stand: Supervision         General transfer comment: for safety. cues for hand placement  Ambulation/Gait Ambulation/Gait assistance: Min guard Gait Distance (Feet): 60 Feet(x2) Assistive device: 4-wheeled walker Gait Pattern/deviations: Step-through pattern;Decreased stride length     General Gait Details: close guard for safety. seated rest break after 60 feet. dyspnea 2/4. O2 sat 92% on 4L [place] and 89% on 3L [place]. slow gait speed.    Stairs             Wheelchair Mobility    Modified Rankin (Stroke Patients Only)       Balance Overall balance assessment: Mild deficits observed, not formally tested                                          Cognition Arousal/Alertness: Awake/alert Behavior During Therapy: WFL for tasks assessed/performed Overall Cognitive Status: Within Functional Limits for tasks assessed                                        Exercises General Exercises - Lower Extremity Hip ABduction/ADduction: AROM;10 reps;Standing Heel Raises: AROM;10 reps;Standing Other Exercises Other Exercises: Knee flexion/hamstring curls, 10 reps, standing    General Comments        Pertinent Vitals/Pain Pain Assessment: Faces Faces Pain Scale: Hurts a little bit Pain Location: chest  Pain Descriptors / Indicators: Tightness Pain Intervention(s): Limited activity within patient's tolerance;Repositioned    Home Living                      Prior Function            PT Goals (current goals can now be found in the care plan section) Progress towards PT goals: Progressing toward goals    Frequency    Min 3X/week      PT Plan Current plan remains appropriate    Co-evaluation              AM-PAC PT "6 Clicks" Mobility   Outcome Measure  Help needed turning from your back to your side while in a flat bed without using bedrails?: None Help needed moving from lying on your back to sitting on the side of a flat bed without using bedrails?: None  Help needed moving to and from a bed to a chair (including a wheelchair)?: None Help needed standing up from a chair using your arms (e.g., wheelchair or bedside chair)?: A Little Help needed to walk in hospital room?: A Little Help needed climbing 3-5 steps with a railing? : A Little 6 Click Score: 21    End of Session Equipment Utilized During Treatment: Oxygen Activity Tolerance: Patient limited by fatigue Patient left: in chair;with call bell/phone within reach   PT Visit Diagnosis: Unsteadiness on feet (R26.81);Muscle weakness (generalized) (M62.81);Difficulty in walking, not elsewhere classified (R26.2)     Time: 0454-0981 PT Time Calculation (min) (ACUTE ONLY): 48 min  Charges:  $Gait Training: 23-37 mins $Therapeutic Exercise: 8-22 mins                        Rebeca Alert, PT Acute Rehabilitation Services Pager:  [phone] Office: [phone]

## 2018-10-27 NOTE — Care Management Note (Signed)
Case Management Note  Patient Details  Name: Alex Mitchell MRN: [id number] Date of Birth: [birth year]  Subjective/Objective: HIV,PNA. From home. No insurance or pcp. Provided patient w/[facility] pcp listing[facility] @ [facility]-patient voiced understanding-will make own pcp hospital f/u appt @ d/c. [facility] referral for HHPT-indigent-Dr. Rama for pcp until established @ [facility]. Patient has own transp home.Noted to be weaned off 02.                  Action/Plan:d/c home w/HHPT.   Expected Discharge Date:  (unknown)               Expected Discharge Plan:  Home w Home Health Services  In-House Referral:     Discharge planning Services  CM Consult, Indigent Health Clinic  Post Acute Care Choice:    Choice offered to:  Patient  DME Arranged:    DME Agency:     HH Arranged:  PT HH Agency:  [facility]  Status of Service:  In process, will continue to follow  If discussed at Long Length of Stay Meetings, dates discussed:    Additional Comments:  Alex Mitchell, Alex Gulley, RN 10/27/2018, 2:19 PM

## 2018-10-27 NOTE — Progress Notes (Signed)
PROGRESS NOTE    Alex Mitchell  ZOX:[id number] DOB: May 14, 1981 DOA: 10/20/2018 PCP: Patient, No Pcp Per      Brief Narrative:  Mr. Mcminn is a 37 y.o. M with no sig PMHx who presents with dyspnea, found to have sepsis, CXR showed multifocal pneumonia, also leukopenia and anemia.  Admitted and started on empiric antibiotics for pneumonia.      Assessment & Plan:  Respiratory failure with hypoxia from Adenovirus pneumonia and PJP pneumonia Still requiring O2 at rest and with ambulation -Continue Bactrim -Continue pulmonary toilet   Sepsis presumed from pneumonia in setting of newly diagnosed HIV Admitted with fever, tachypnea, tachycardia. Adenovirus on RVP. CXR showed bilateral opacities, suspect PJP in addition to Adeno.  Blood cultures no growth.  -Continue IV Bactrim, dsicussed with RPh -Continue prednisone to taper to once daily Dec 10 and finish Dec 27  Thrush Improved  -Continue fluconazole 2 weeks to end Dec 20  HIV, new diagnosis -Consult ID, appreciate expert guidance -Continue new Biktarvy -SW and RPh involved for post-discharge follow up, med assist  Hyperkalemia Improved.  I still expect this to resolve, but will obtain G6PD in case Bactrim must be stopped. -Daily BMP  Anemia Normocytic, hypoproliferative. From HIV.  Ferritin elevated, but iron sat very low.  Schistocytes on smear per lab, but renal function normal, mentation normal, no suspicion for TTP.  RI low.  haptoGlobin high.  Coombs negative. -Continue iron  Leukopenia From HIV  Transaminitis  Elevated troponin Demand supply mismatch in setting of severe hypoxic respiratory failure.     MDM and disposition: All labs and imaging reports were reviewed and summarized above, medication management as above.  The patient was admitted with a severe acute hypoxic respiratory failure, severe illness with threat to life, bowel function.  He is still on supplemental oxygen, requiring IV antibiotics,  with a new diagnosis of HIV.    He remains hypoxic.    We will continue IV Bactrim, steroids, close monitoring of potassium until able to wean O2.         DVT prophylaxis: SCDs Code Status: FULL Family Communication: None     Consultants:   Infectious disease  Procedures:   None  Antimicrobials:   Azithromycin x1 12/2  Cefepime 12/2 >> 12/3  Vancomycin x1 12/2  Ceftriaxone 12/2 >> 12/5  Bactrim 12/2 >>  FLuconazole 12/6 >>       Subjective: Afebrile, dyspnea improving.  No chest pain, sputum.          Objective: Vitals:   10/26/18 1325 10/26/18 2115 10/27/18 0415 10/27/18 1525  BP:  (!) 144/83 (!) 148/61 (!) 144/76  Pulse: 84 87 79 79  Resp:  18 18 17   Temp:  98.8 F (37.1 C) 98 F (36.7 C) 98.6 F (37 C)  TempSrc:  Oral Oral Oral  SpO2: (!) 89% 91% 90% 93%  Weight:      Height:        Intake/Output Summary (Last 24 hours) at 10/27/2018 1600 Last data filed at 10/27/2018 1400 Gross per 24 hour  Intake 2793.09 ml  Output 2925 ml  Net -131.91 ml   Filed Weights   10/20/18 2043 10/20/18 2052 10/20/18 2211  Weight: 98.9 kg 98.9 kg 127 kg    Examination: General appearance: Well-nourished, adult male, no acute distress, itneractive. HEENT: Anicteric, conjunctival pink, lids and lashes normal.  No nasal deformity, discharge, or epistaxis.  Lips moist, dentition normal.  White plaques in the posterior pharynx are gone.  Oropharynx moist.  Hearing normal. Skin: Skin warm and dry, no new suspicious rashes or lesions. Cardiac: RRR, no murmurs, no LE edema. Respiratory: RR normal, rales bialterally, improved.  No wheezing.    Abdomen: Abdomen soft and without tenderness.  No ascites or distension. MSK: No deformities or effusions of the large joints of the upper lower extremities bilaterally.  Normal muscle bulk and tone.   Neuro: Awake and alert, EOMI, speech fluent, moves all extremities equally.   Psych: Sensorium intact responding to  questions, attention normal, affect and judgment normal..     Data Reviewed: I have personally reviewed following labs and imaging studies:  CBC: Recent Labs  Lab 10/20/18 2211 10/21/18 0315 10/23/18 0426 10/24/18 0557 10/26/18 0412 10/27/18 0423  WBC 2.1* 2.5* 2.1* 2.9* 4.3 5.4  NEUTROABS 0.7* 1.0*  --   --   --   --   HGB 9.3* 8.6* 8.5* 8.7* 9.1* 9.3*  HCT 29.6* 28.3* 27.6* 27.7* 28.6* 30.5*  MCV 88.9 91.0 88.2 89.1 88.0 86.6  PLT 383 338 313 391 461* 614*   Basic Metabolic Panel: Recent Labs  Lab 10/21/18 0315 10/23/18 0426 10/24/18 0557 10/25/18 0416 10/26/18 0412 10/27/18 0423  NA 136 135 133* 130* 131* 131*  K 4.1 4.2 5.0 5.2* 5.9* 4.6  CL 102 103 102 101 102 101  CO2 24 22 19* 20* 19* 20*  GLUCOSE 96 89 118* 108* 105* 100*  BUN 14 11 17  21* 25* 26*  CREATININE 1.13 1.17 1.16 1.08 1.21 1.19  CALCIUM 8.2* 8.8* 8.8* 9.2 9.6 9.7  MG 1.6*  --   --   --   --   --   PHOS 4.3  --   --   --   --   --    GFR: Estimated Creatinine Clearance: 110.4 mL/min (by C-G formula based on SCr of 1.19 mg/dL). Liver Function Tests: Recent Labs  Lab 10/20/18 2211 10/21/18 0315  AST 104* 93*  ALT 91* 83*  ALKPHOS 146* 132*  BILITOT 0.8 0.5  PROT 7.8 6.7  ALBUMIN 3.2* 2.6*   No results for input(s): LIPASE, AMYLASE in the last 168 hours. No results for input(s): AMMONIA in the last 168 hours. Coagulation Profile: Recent Labs  Lab 10/21/18 0315  INR 1.25   Cardiac Enzymes: Recent Labs  Lab 10/21/18 0315  CKTOTAL 35*  TROPONINI 0.03*   BNP (last 3 results) No results for input(s): PROBNP in the last 8760 hours. HbA1C: No results for input(s): HGBA1C in the last 72 hours. CBG: No results for input(s): GLUCAP in the last 168 hours. Lipid Profile: No results for input(s): CHOL, HDL, LDLCALC, TRIG, CHOLHDL, LDLDIRECT in the last 72 hours. Thyroid Function Tests: No results for input(s): TSH, T4TOTAL, FREET4, T3FREE, THYROIDAB in the last 72 hours. Anemia  Panel: No results for input(s): VITAMINB12, FOLATE, FERRITIN, TIBC, IRON, RETICCTPCT in the last 72 hours. Urine analysis:    Component Value Date/Time   COLORURINE YELLOW 10/20/2018 2211   APPEARANCEUR CLEAR 10/20/2018 2211   LABSPEC 1.016 10/20/2018 2211   PHURINE 6.0 10/20/2018 2211   GLUCOSEU NEGATIVE 10/20/2018 2211   HGBUR NEGATIVE 10/20/2018 2211   BILIRUBINUR NEGATIVE 10/20/2018 2211   KETONESUR 5 (A) 10/20/2018 2211   PROTEINUR 100 (A) 10/20/2018 2211   NITRITE NEGATIVE 10/20/2018 2211   LEUKOCYTESUR NEGATIVE 10/20/2018 2211   Sepsis Labs: @LABRCNTIP (procalcitonin:4,lacticacidven:4)  ) Recent Results (from the past 240 hour(s))  Blood Culture (routine x 2)     Status: None  Collection Time: 10/20/18 10:11 PM  Result Value Ref Range Status   Specimen Description   Final    BLOOD RIGHT ANTECUBITAL Performed at [facility], [address] [place], [place] [address]    Special Requests   Final    BOTTLES DRAWN AEROBIC AND ANAEROBIC Blood Culture adequate volume Performed at [facility], [address] [place], [place] [address]    Culture   Final    NO GROWTH 5 DAYS Performed at [facility], [address] [place], [place] [address]    Report Status 10/26/2018 FINAL  Final  Blood Culture (routine x 2)     Status: None   Collection Time: 10/20/18 10:11 PM  Result Value Ref Range Status   Specimen Description   Final    BLOOD LEFT ANTECUBITAL Performed at [facility], [address] [place], [place] [address]    Special Requests   Final    BOTTLES DRAWN AEROBIC AND ANAEROBIC Blood Culture adequate volume Performed at [facility], [address] [place], [place] [address]    Culture   Final    NO GROWTH 5 DAYS Performed at [facility], [address] [place], [place] [address]    Report Status 10/26/2018 FINAL  Final  Respiratory Panel by PCR     Status:  Abnormal   Collection Time: 10/20/18 11:45 PM  Result Value Ref Range Status   Adenovirus DETECTED (A) NOT DETECTED Final   Coronavirus 229E NOT DETECTED NOT DETECTED Final   Coronavirus HKU1 NOT DETECTED NOT DETECTED Final   Coronavirus NL63 NOT DETECTED NOT DETECTED Final   Coronavirus OC43 NOT DETECTED NOT DETECTED Final   Metapneumovirus NOT DETECTED NOT DETECTED Final   Rhinovirus / Enterovirus NOT DETECTED NOT DETECTED Final   Influenza A NOT DETECTED NOT DETECTED Final   Influenza B NOT DETECTED NOT DETECTED Final   Parainfluenza Virus 1 NOT DETECTED NOT DETECTED Final   Parainfluenza Virus 2 NOT DETECTED NOT DETECTED Final   Parainfluenza Virus 3 NOT DETECTED NOT DETECTED Final   Parainfluenza Virus 4 NOT DETECTED NOT DETECTED Final   Respiratory Syncytial Virus NOT DETECTED NOT DETECTED Final   Bordetella pertussis NOT DETECTED NOT DETECTED Final   Chlamydophila pneumoniae NOT DETECTED NOT DETECTED Final   Mycoplasma pneumoniae NOT DETECTED NOT DETECTED Final    Comment: Performed at [facility], [address] [place], [place] [address]  MRSA PCR Screening     Status: None   Collection Time: 10/21/18  1:50 AM  Result Value Ref Range Status   MRSA by PCR NEGATIVE NEGATIVE Final    Comment:        The GeneXpert MRSA Assay (FDA approved for NASAL specimens only), is one component of a comprehensive MRSA colonization surveillance program. It is not intended to diagnose MRSA infection nor to guide or monitor treatment for MRSA infections. Performed at [facility], [address] [place], [place] [address]   Pneumocystis smear by DFA     Status: None   Collection Time: 10/21/18  4:24 AM  Result Value Ref Range Status   Specimen Source-PJSRC SPUTUM  Final   Pneumocystis jiroveci Ag NEGATIVE  Final    Comment: Performed at [facility] Performed at [facility] Performed at [facility], [address] [place], [place] [address]   Culture, sputum-assessment     Status: None  Collection Time: 10/21/18  4:27 AM  Result Value Ref Range Status   Specimen Description SPUTUM  Final   Special Requests Immunocompromised  Final   Sputum evaluation   Final    Sputum specimen not acceptable for testing.  Please recollect.   INFORMED KAITLYN @0508  ON 12.3.19 BY [facility] Performed at [facility], [address], [place], [place] [address]    Report Status 10/21/2018 FINAL  Final         Radiology Studies: No results found.      Scheduled Meds: . azithromycin  1,200 mg Oral Weekly  . bictegravir-emtricitabine-tenofovir AF  1 tablet Oral Daily  . enoxaparin (LOVENOX) injection  40 mg Subcutaneous Q24H  . ferrous sulfate  325 mg Oral QODAY  . guaiFENesin  600 mg Oral BID  . magic mouthwash  5 mL Oral BID  . mouth rinse  15 mL Mouth Rinse BID  . pantoprazole  40 mg Oral Daily  . predniSONE  40 mg Oral BID WC   Continuous Infusions: . sodium chloride Stopped (10/27/18 0553)  . fluconazole (DIFLUCAN) IV Stopped (10/26/18 2148)  . sulfamethoxazole-trimethoprim 480 mg (10/27/18 1355)     LOS: 7 days    Time spent: 25 minutes    Alberteen Sam, MD [facility] 10/27/2018, 4:00 PM     Please page through AMION:  [email or web address] Password [email or web address] If 7PM-7AM, please contact night-coverage

## 2018-10-28 ENCOUNTER — Telehealth: Payer: Self-pay | Admitting: *Deleted

## 2018-10-28 LAB — CBC
HCT: 29.9 % — ABNORMAL LOW (ref 39.0–52.0)
Hemoglobin: 9.2 g/dL — ABNORMAL LOW (ref 13.0–17.0)
MCH: 27.1 pg (ref 26.0–34.0)
MCHC: 30.8 g/dL (ref 30.0–36.0)
MCV: 87.9 fL (ref 80.0–100.0)
Platelets: 567 10*3/uL — ABNORMAL HIGH (ref 150–400)
RBC: 3.4 MIL/uL — ABNORMAL LOW (ref 4.22–5.81)
RDW: 15.6 % — ABNORMAL HIGH (ref 11.5–15.5)
WBC: 5.6 10*3/uL (ref 4.0–10.5)
nRBC: 0 % (ref 0.0–0.2)

## 2018-10-28 LAB — BASIC METABOLIC PANEL
Anion gap: 10 (ref 5–15)
BUN: 23 mg/dL — AB (ref 6–20)
CHLORIDE: 100 mmol/L (ref 98–111)
CO2: 20 mmol/L — ABNORMAL LOW (ref 22–32)
Calcium: 9.8 mg/dL (ref 8.9–10.3)
Creatinine, Ser: 1.03 mg/dL (ref 0.61–1.24)
GFR calc Af Amer: >60 mL/min (ref >60)
GFR calc non Af Amer: >60 mL/min (ref >60)
Glucose, Bld: 102 mg/dL — ABNORMAL HIGH (ref 70–99)
Potassium: 5 mmol/L (ref 3.5–5.1)
Sodium: 130 mmol/L — ABNORMAL LOW (ref 135–145)

## 2018-10-28 MED ORDER — ATOVAQUONE 750 MG/5ML PO SUSP: 750.0000 mg | Freq: Two times a day (BID) | ORAL | 0 refills | Status: DC

## 2018-10-28 MED ORDER — ATOVAQUONE 750 MG/5ML PO SUSP
750.0000 mg | Freq: Two times a day (BID) | ORAL | Status: DC
  Administered 2018-10-28 – 2018-10-29 (×2): 750 mg via ORAL
  Filled 2018-10-28 (×4): qty 5

## 2018-10-28 MED ORDER — PREDNISONE 20 MG PO TABS
40.0000 mg | ORAL_TABLET | Freq: Every day | ORAL | Status: DC
  Administered 2018-10-29: 40 mg via ORAL
  Filled 2018-10-28: qty 2

## 2018-10-28 NOTE — Telephone Encounter (Signed)
-----   Message from Judyann Munsonynthia Snider, MD sent at 10/23/2018  5:00 PM EST ----- Can we get michelle evans or Alex Mitchell to come to [place] to get adap/ryan white paperwork started on him so that we can get emergency adap. He is hospitalized for pcp pneumonia

## 2018-10-28 NOTE — Progress Notes (Addendum)
Patient's O2 saturation on room air has held at 92-94 today.   When ambulating without oxygen in hallway, patient drops as low as 82  When ambulating with oxygen in hallway patient remains 92 or higher.

## 2018-10-28 NOTE — Telephone Encounter (Signed)
[facility] and someone will go to Hospital to get the information needed to start application.

## 2018-10-28 NOTE — Care Management Note (Signed)
Case Management Note  Patient Details  Name: Archie Pattenltron Mccollom MRN: [id number] Date of Birth: August 17, 1981  Subjective/Objective: CM referral for Atovaquone-[facility]-Dr. Drue SecondSnider notified-she will consider a more reasonable med. Patient will make appt @ [facility](any of the sites), & [facility]c. Does not qualify for home 02 d/t no qualifying dx-MD notified.Patient will private pay for home 02-[facility] will talk to him about the cost.                   Action/Plan:d/c home w/HHC/home 02   Expected Discharge Date:  (unknown)               Expected Discharge Plan:  Home w Home Health Services  In-House Referral:     Discharge planning Services  CM Consult[facility]  Post Acute Care Choice:    Choice offered to:  Patient  DME Arranged:    DME Agency:     HH Arranged:  PT HH Agency:  [facility]  Status of Service:  In process, will continue to follow  If discussed at Long Length of Stay Meetings, dates discussed:    Additional Comments:  Lanier ClamMahabir, Nayelly Laughman, RN 10/28/2018, 1:50 PM

## 2018-10-28 NOTE — Progress Notes (Signed)
ID PROGRESS NOTE  This morning labs show increase in serum K+ up to 5.0. He is unable to tolerate bactrim without treating his hyperkalemia.    Pneumocystitis pneumonia = - We will plan to discontinue his bactrim and plan to switch out course to atovaquone 750mg  BID with meals for the remaining 15 days of treatment  - continue on prednisone taper - supplemental oxygen  hiv disease= - continue on bitarvy daily  Thrush/eso candidiasis = -continue on fluconazole 400mg  daily  oi proph = - azithromycin 1200mg  qweek on mondays  dispo = will need match program to get access to his abtx,steroids, (already has biktarvy)

## 2018-10-28 NOTE — Progress Notes (Signed)
PROGRESS NOTE    Alex Mitchell  WUJ:[id number] DOB: [birth year] DOA: 10/20/2018 PCP: Patient, No Pcp Per      Brief Narrative:  Alex Mitchell is a 37 y.o. M with no sig PMHx who presents with dyspnea, found to have sepsis, CXR showed multifocal pneumonia, also leukopenia and anemia.  Admitted and started on empiric antibiotics for pneumonia.      Assessment & Plan:  Respiratory failure with hypoxia from Adenovirus pneumonia and PJP pneumonia .Weaned off oxygen at rest.  Desaturating with ambulation. -Atovaquone for PJP per infectious disease -Wean oxygen as able   Sepsis presumed from pneumonia in setting of newly diagnosed HIV Admitted with fever, tachypnea, tachycardia. Adenovirus on RVP. CXR showed bilateral opacities, suspect PJP in addition to Adeno.  Blood cultures no growth.  -Continue atovaquone -Continue prednisone to taper to 20 mg Dec 14 and finish Dec 27  Thrush Improved  -Continue fluconazole 2 weeks to end Dec 20  HIV, new diagnosis -Consult ID, appreciate expert guidance -Continue Biktarvy -SW and RPh involved for post-discharge follow up, med assist  Hyperkalemia Potassium pending back up on Bactrim. -Daily BMP  Anemia Normocytic, hypoproliferative. From HIV.  Ferritin elevated, but iron sat very low.  Schistocytes on smear per lab, but renal function normal, mentation normal, no suspicion for TTP.  RI low.  haptoGlobin high.  Coombs negative. -Continue iron every other day  Leukopenia From HIV  Transaminitis  Elevated troponin Demand supply mismatch in setting of severe hypoxic respiratory failure.     MDM and disposition: The labs and imaging reports below were viewed and summarized above.  Medication management as above.  Case discussed with IPH and infectious disease.  The patient was admitted with a severe acute hypoxic respiratory failure, severe illness with threat to life, bowel function.    At this time, his antiretrovirals are in  place for discharge.  He needs medication coordination to obtain his atovaquone which will cost $2000 for his 1 month supply, which is pending.  Furthermore he is persistently requiring oxygen with ambulation, which would need coordination for discharge.  Once medications and oxygen can be arranged for discharge, we will likely discharge, hopefully tomorrow morning.          DVT prophylaxis: SCDs Code Status: FULL Family Communication: None     Consultants:   Infectious disease  Procedures:   None  Antimicrobials:   Azithromycin x1 12/2  Cefepime 12/2 >> 12/3  Vancomycin x1 12/2  Ceftriaxone 12/2 >> 12/5  Bactrim 12/2 >>  FLuconazole 12/6 >>       Subjective: Afebrile, no dyspnea, no chest pain, no sputum production.  He is feeling much better, spirits are good, appetite good.          Objective: Vitals:   10/27/18 1525 10/27/18 2135 10/28/18 0541 10/28/18 1403  BP: (!) 144/76 127/84 132/72 137/70  Pulse: 79 84 80 94  Resp: 17 16 16 18   Temp: 98.6 F (37 C) 97.8 F (36.6 C) 97.7 F (36.5 C)   TempSrc: Oral Oral Oral   SpO2: 93% 98% 95% 93%  Weight:      Height:        Intake/Output Summary (Last 24 hours) at 10/28/2018 1745 Last data filed at 10/28/2018 1500 Gross per 24 hour  Intake 1482.65 ml  Output 2075 ml  Net -592.35 ml   Filed Weights   10/20/18 2043 10/20/18 2052 10/20/18 2211  Weight: 98.9 kg 98.9 kg 127 kg    Examination: General  appearance: Well-nourished adult male, no acute distress, interactive. HEENT: Anicteric, conjunctival pink, lids and lashes normal.  No nasal deformity, discharge, or epistaxis.  Lips moist, dentition normal.  Oropharynx moist, hearing normal. Skin: Skin warm and dry, no new suspicious rashes or lesions.   Cardiac: RRR, no murmurs, no lower extremity edema. Respiratory: Respiratory effort normal, rales bilaterally, no wheezing.    Abdomen: Abdomen soft without tenderness to palpation, ascites,  or distention. MSK: No deformities or effusions of the large joints of the upper or lower extremities bilaterally.  Normal muscle bulk and tone.   Neuro: Awake and alert, EOMI, speech fluent, moves all extremities equally.   Psych: Sensorium intact responding to questions, attention normal, affect and judgment normal..     Data Reviewed: I have personally reviewed following labs and imaging studies:  CBC: Recent Labs  Lab 10/23/18 0426 10/24/18 0557 10/26/18 0412 10/26/18 1029 10/27/18 0423 10/28/18 0507  WBC 2.1* 2.9* 4.3  --  5.4 5.6  HGB 8.5* 8.7* 9.1* 9.2* 9.3* 9.2*  HCT 27.6* 27.7* 28.6*  --  30.5* 29.9*  MCV 88.2 89.1 88.0  --  86.6 87.9  PLT 313 391 461*  --  614* 567*   Basic Metabolic Panel: Recent Labs  Lab 10/24/18 0557 10/25/18 0416 10/26/18 0412 10/27/18 0423 10/28/18 0507  NA 133* 130* 131* 131* 130*  K 5.0 5.2* 5.9* 4.6 5.0  CL 102 101 102 101 100  CO2 19* 20* 19* 20* 20*  GLUCOSE 118* 108* 105* 100* 102*  BUN 17 21* 25* 26* 23*  CREATININE 1.16 1.08 1.21 1.19 1.03  CALCIUM 8.8* 9.2 9.6 9.7 9.8   GFR: Estimated Creatinine Clearance: 127.5 mL/min (by C-G formula based on SCr of 1.03 mg/dL). Liver Function Tests: No results for input(s): AST, ALT, ALKPHOS, BILITOT, PROT, ALBUMIN in the last 168 hours. No results for input(s): LIPASE, AMYLASE in the last 168 hours. No results for input(s): AMMONIA in the last 168 hours. Coagulation Profile: No results for input(s): INR, PROTIME in the last 168 hours. Cardiac Enzymes: No results for input(s): CKTOTAL, CKMB, CKMBINDEX, TROPONINI in the last 168 hours. BNP (last 3 results) No results for input(s): PROBNP in the last 8760 hours. HbA1C: No results for input(s): HGBA1C in the last 72 hours. CBG: No results for input(s): GLUCAP in the last 168 hours. Lipid Profile: No results for input(s): CHOL, HDL, LDLCALC, TRIG, CHOLHDL, LDLDIRECT in the last 72 hours. Thyroid Function Tests: No results for  input(s): TSH, T4TOTAL, FREET4, T3FREE, THYROIDAB in the last 72 hours. Anemia Panel: No results for input(s): VITAMINB12, FOLATE, FERRITIN, TIBC, IRON, RETICCTPCT in the last 72 hours. Urine analysis:    Component Value Date/Time   COLORURINE YELLOW 10/20/2018 2211   APPEARANCEUR CLEAR 10/20/2018 2211   LABSPEC 1.016 10/20/2018 2211   PHURINE 6.0 10/20/2018 2211   GLUCOSEU NEGATIVE 10/20/2018 2211   HGBUR NEGATIVE 10/20/2018 2211   BILIRUBINUR NEGATIVE 10/20/2018 2211   KETONESUR 5 (A) 10/20/2018 2211   PROTEINUR 100 (A) 10/20/2018 2211   NITRITE NEGATIVE 10/20/2018 2211   LEUKOCYTESUR NEGATIVE 10/20/2018 2211   Sepsis Labs: @LABRCNTIP (procalcitonin:4,lacticacidven:4)  ) Recent Results (from the past 240 hour(s))  Blood Culture (routine x 2)     Status: None   Collection Time: 10/20/18 10:11 PM  Result Value Ref Range Status   Specimen Description   Final    BLOOD RIGHT ANTECUBITAL Performed at [facility], [address] [place], [place] [address]    Special Requests  Final    BOTTLES DRAWN AEROBIC AND ANAEROBIC Blood Culture adequate volume Performed at [facility], [address] [place], [place] [address]    Culture   Final    NO GROWTH 5 DAYS Performed at [facility], [address] [place], [place] [address]    Report Status 10/26/2018 FINAL  Final  Blood Culture (routine x 2)     Status: None   Collection Time: 10/20/18 10:11 PM  Result Value Ref Range Status   Specimen Description   Final    BLOOD LEFT ANTECUBITAL Performed at [facility], [address] [place], [place] [address]    Special Requests   Final    BOTTLES DRAWN AEROBIC AND ANAEROBIC Blood Culture adequate volume Performed at [facility], [address] [place], [place] [address]    Culture   Final    NO GROWTH 5 DAYS Performed at [facility], [address] [place], [place] [address]     Report Status 10/26/2018 FINAL  Final  Respiratory Panel by PCR     Status: Abnormal   Collection Time: 10/20/18 11:45 PM  Result Value Ref Range Status   Adenovirus DETECTED (A) NOT DETECTED Final   Coronavirus 229E NOT DETECTED NOT DETECTED Final   Coronavirus HKU1 NOT DETECTED NOT DETECTED Final   Coronavirus NL63 NOT DETECTED NOT DETECTED Final   Coronavirus OC43 NOT DETECTED NOT DETECTED Final   Metapneumovirus NOT DETECTED NOT DETECTED Final   Rhinovirus / Enterovirus NOT DETECTED NOT DETECTED Final   Influenza A NOT DETECTED NOT DETECTED Final   Influenza B NOT DETECTED NOT DETECTED Final   Parainfluenza Virus 1 NOT DETECTED NOT DETECTED Final   Parainfluenza Virus 2 NOT DETECTED NOT DETECTED Final   Parainfluenza Virus 3 NOT DETECTED NOT DETECTED Final   Parainfluenza Virus 4 NOT DETECTED NOT DETECTED Final   Respiratory Syncytial Virus NOT DETECTED NOT DETECTED Final   Bordetella pertussis NOT DETECTED NOT DETECTED Final   Chlamydophila pneumoniae NOT DETECTED NOT DETECTED Final   Mycoplasma pneumoniae NOT DETECTED NOT DETECTED Final    Comment: Performed at [facility], [address] [place], [place] [address]  MRSA PCR Screening     Status: None   Collection Time: 10/21/18  1:50 AM  Result Value Ref Range Status   MRSA by PCR NEGATIVE NEGATIVE Final    Comment:        The GeneXpert MRSA Assay (FDA approved for NASAL specimens only), is one component of a comprehensive MRSA colonization surveillance program. It is not intended to diagnose MRSA infection nor to guide or monitor treatment for MRSA infections. Performed at [facility], [address] [place], [place] [address]   Pneumocystis smear by DFA     Status: None   Collection Time: 10/21/18  4:24 AM  Result Value Ref Range Status   Specimen Source-PJSRC SPUTUM  Final   Pneumocystis jiroveci Ag NEGATIVE  Final    Comment: Performed at [facility] Performed at [facility] Performed at [facility], [address] [place], [place] [address]   Culture, sputum-assessment     Status: None   Collection Time: 10/21/18  4:27 AM  Result Value Ref Range Status   Specimen Description SPUTUM  Final   Special Requests Immunocompromised  Final   Sputum evaluation   Final    Sputum specimen not acceptable for testing.  Please recollect.   INFORMED KAITLYN @0508  ON 12.3.19 BY [facility] Performed at [facility], [address], [place], [place] [address]    Report Status 10/21/2018 FINAL  Final         Radiology Studies: No results found.      Scheduled Meds: . atovaquone  750 mg Oral BID WC  . azithromycin  1,200 mg Oral Weekly  . bictegravir-emtricitabine-tenofovir AF  1 tablet Oral Daily  . enoxaparin (LOVENOX) injection  40 mg Subcutaneous Q24H  . ferrous sulfate  325 mg Oral QODAY  . fluconazole  400 mg Oral Daily  . guaiFENesin  600 mg Oral BID  . magic mouthwash  5 mL Oral BID  . mouth rinse  15 mL Mouth Rinse BID  . pantoprazole  40 mg Oral Daily  . predniSONE  40 mg Oral BID WC   Continuous Infusions: . sodium chloride Stopped (10/27/18 0553)     LOS: 8 days    Time spent: 35 minutes    Alberteen Sam, MD [facility] 10/28/2018, 5:45 PM     Please page through AMION:  [email or web address] Password [email or web address] If 7PM-7AM, please contact night-coverage

## 2018-10-28 NOTE — Progress Notes (Signed)
SATURATION QUALIFICATIONS: (This note is used to comply with regulatory documentation for home oxygen)  Patient Saturations on Room Air at Rest = 94%  Patient Saturations on Room Air while Ambulating = 82%  Patient Saturations on 1Liters of oxygen while Ambulating = 92-94%  Please briefly explain why patient needs home oxygen:Paient oxygen levels drop to low to mid 80's when walking without oxygen.

## 2018-10-29 ENCOUNTER — Other Ambulatory Visit: Payer: Self-pay | Admitting: Behavioral Health

## 2018-10-29 DIAGNOSIS — B20 Human immunodeficiency virus [HIV] disease: Secondary | ICD-10-CM

## 2018-10-29 DIAGNOSIS — J189 Pneumonia, unspecified organism: Secondary | ICD-10-CM

## 2018-10-29 LAB — CBC
HEMATOCRIT: 31.2 % — AB (ref 39.0–52.0)
Hemoglobin: 9.8 g/dL — ABNORMAL LOW (ref 13.0–17.0)
MCH: 27.5 pg (ref 26.0–34.0)
MCHC: 31.4 g/dL (ref 30.0–36.0)
MCV: 87.4 fL (ref 80.0–100.0)
Platelets: 644 10*3/uL — ABNORMAL HIGH (ref 150–400)
RBC: 3.57 MIL/uL — ABNORMAL LOW (ref 4.22–5.81)
RDW: 15.5 % (ref 11.5–15.5)
WBC: 6.3 10*3/uL (ref 4.0–10.5)
nRBC: 0 % (ref 0.0–0.2)

## 2018-10-29 LAB — BASIC METABOLIC PANEL
ANION GAP: 11 (ref 5–15)
BUN: 24 mg/dL — ABNORMAL HIGH (ref 6–20)
CO2: 22 mmol/L (ref 22–32)
Calcium: 10.5 mg/dL — ABNORMAL HIGH (ref 8.9–10.3)
Chloride: 98 mmol/L (ref 98–111)
Creatinine, Ser: 1.08 mg/dL (ref 0.61–1.24)
GFR calc Af Amer: >60 mL/min (ref >60)
GFR calc non Af Amer: >60 mL/min (ref >60)
Glucose, Bld: 114 mg/dL — ABNORMAL HIGH (ref 70–99)
POTASSIUM: 5.5 mmol/L — AB (ref 3.5–5.1)
Sodium: 131 mmol/L — ABNORMAL LOW (ref 135–145)

## 2018-10-29 MED ORDER — FLUCONAZOLE 200 MG PO TABS: 400.0000 mg | ORAL_TABLET | Freq: Every day | ORAL | 0 refills | Status: DC

## 2018-10-29 MED ORDER — HYDROCOD POLST-CPM POLST ER 10-8 MG/5ML PO SUER: 5.0000 mL | Freq: Two times a day (BID) | ORAL | 0 refills | Status: DC | PRN

## 2018-10-29 MED ORDER — ATOVAQUONE 750 MG/5ML PO SUSP: 750.0000 mg | Freq: Two times a day (BID) | ORAL | 0 refills | Status: AC

## 2018-10-29 MED ORDER — PREDNISONE 20 MG PO TABS: ORAL_TABLET | ORAL | 0 refills | Status: DC

## 2018-10-29 MED ORDER — SULFAMETHOXAZOLE-TRIMETHOPRIM 800-160 MG PO TABS: 1.0000 | ORAL_TABLET | Freq: Once | ORAL | 1 refills | Status: DC

## 2018-10-29 MED ORDER — FUROSEMIDE 10 MG/ML IJ SOLN
40.0000 mg | Freq: Once | INTRAMUSCULAR | Status: AC
  Administered 2018-10-29: 40 mg via INTRAVENOUS
  Filled 2018-10-29 (×2): qty 4

## 2018-10-29 MED ORDER — AZITHROMYCIN 600 MG PO TABS: 1200.0000 mg | ORAL_TABLET | ORAL | 1 refills | Status: DC

## 2018-10-29 MED ORDER — FERROUS SULFATE 325 (65 FE) MG PO TABS: 325.0000 mg | ORAL_TABLET | ORAL | 3 refills | Status: DC

## 2018-10-29 MED ORDER — CLINDAMYCIN HCL 300 MG PO CAPS: 300.0000 mg | ORAL_CAPSULE | Freq: Three times a day (TID) | ORAL | 0 refills | Status: DC

## 2018-10-29 MED ORDER — BICTEGRAVIR-EMTRICITAB-TENOFOV 50-200-25 MG PO TABS: 1.0000 | ORAL_TABLET | Freq: Every day | ORAL | 1 refills | Status: DC

## 2018-10-29 MED ORDER — AZITHROMYCIN 600 MG PO TABS: 1200.0000 mg | ORAL_TABLET | ORAL | 0 refills | Status: DC

## 2018-10-29 MED FILL — FERROUS SULFATE 325 MG TAB: 325 (65 FE) | 30 days supply | Qty: 15 | Fill #0

## 2018-10-29 MED FILL — AZITHROMYCIN 600 MG TABLET: 600 | 28 days supply | Qty: 8 | Fill #0

## 2018-10-29 MED FILL — predniSONE 20 MG TABS: 20 | 14 days supply | Qty: 17 | Fill #0

## 2018-10-29 MED FILL — FLUCONAZOLE 200 MG TAB: 200 | 9 days supply | Qty: 18 | Fill #0

## 2018-10-29 NOTE — Care Management Note (Signed)
Case Management Note  Patient Details  Name: Alex Mitchell MRN: [id number] Date of Birth: [birth year]  Subjective/Objective: Patient has an appt w/[facility] sites-[facility]patient voiced understanding per nsg. [facility] rep Clydie BraunKaren will deliver home 02,rw to rm prior d/c. [facility] HHPT-arranged. No further CM needs.                   Action/Plan:dc home w/HHC/dme   Expected Discharge Date:  10/29/18               Expected Discharge Plan:  Home w Home Health Services  In-House Referral:     Discharge planning Services  CM Consult, [facility]  Post Acute Care Choice:    Choice offered to:  Patient  DME Arranged:  Oxygen DME Agency:  [facility]  HH Arranged:  [facility]  Status of Service:  Completed, signed off  If discussed at Long Length of Stay Meetings, dates discussed:    Additional Comments:  Lanier ClamMahabir, Ernesteen Mihalic, RN 10/29/2018, 11:57 AM

## 2018-10-29 NOTE — Progress Notes (Signed)
[facility]    Date of Admission:  10/20/2018   Total days of antibiotics 8           ID: Alex Mitchell is a 37 y.o. male with PCP pneumonia, newly dx hiv disease. eso candidiasis Active Problems:   CAP (community acquired pneumonia)   Neutropenia with fever (HCC)   Leukopenia   Anemia   Acute respiratory failure with hypoxia (HCC)   Proteinuria   Elevated LFTs   Sepsis (HCC)    Subjective: Afebrile, feeling good. Eating banana and fruit shake/protein drink. He has been desaturating with ambulation per dr danford  Medications:  . atovaquone  750 mg Oral BID WC  . azithromycin  1,200 mg Oral Weekly  . bictegravir-emtricitabine-tenofovir AF  1 tablet Oral Daily  . enoxaparin (LOVENOX) injection  40 mg Subcutaneous Q24H  . ferrous sulfate  325 mg Oral QODAY  . fluconazole  400 mg Oral Daily  . guaiFENesin  600 mg Oral BID  . magic mouthwash  5 mL Oral BID  . mouth rinse  15 mL Mouth Rinse BID  . pantoprazole  40 mg Oral Daily  . predniSONE  40 mg Oral Q breakfast    Objective: Vital signs in last 24 hours: Temp:  [97.8 F (36.6 C)-98.2 F (36.8 C)] 98.2 F (36.8 C) (12/11 0600) Pulse Rate:  [77-87] 87 (12/11 0600) Resp:  [16] 16 (12/11 0600) BP: (133-143)/(82-90) 143/82 (12/11 0600) SpO2:  [92 %-98 %] 98 % (12/11 0600) Physical Exam  Constitutional: He is oriented to person, place, and time. He appears well-developed and well-nourished. No distress.  HENT:  Mouth/Throat: Oropharynx is clear and moist. No oropharyngeal exudate.  Cardiovascular: Normal rate, regular rhythm and normal heart sounds. Exam reveals no gallop and no friction rub.  No murmur heard.  Pulmonary/Chest: Effort normal and breath sounds normal. No respiratory distress. He has no wheezes.  Abdominal: Soft. Bowel sounds are normal. He exhibits no distension. There is no tenderness.  Lymphadenopathy:  He has no cervical adenopathy.  Neurological: He is alert and  oriented to person, place, and time.  Skin: Skin is warm and dry. No rash noted. No erythema.  Psychiatric: He has a normal mood and affect. His behavior is normal.     Lab Results Recent Labs    10/28/18 0507 10/29/18 0453  WBC 5.6 6.3  HGB 9.2* 9.8*  HCT 29.9* 31.2*  NA 130* 131*  K 5.0 5.5*  CL 100 98  CO2 20* 22  BUN 23* 24*  CREATININE 1.03 1.08   Liver Panel No results for input(s): PROT, ALBUMIN, AST, ALT, ALKPHOS, BILITOT, BILIDIR, IBILI in the last 72 hours. Sedimentation Rate No results for input(s): ESRSEDRATE in the last 72 hours. C-Reactive Protein No results for input(s): CRP in the last 72 hours.  Microbiology: reviewed Studies/Results: No results found.   Assessment/Plan: hiv disease= we will see him back in the ID clinic at end of the week. We will check his BMP to ensure potassium is WNL.  Hyperkalemia = It appears that he has been eating bananas and other protein rich foods that could contribute to hyper K recently. Will ask patient to hold of on these potassium rich foods for the next 7-14d  pjp pneumonia = patient is awaiting patient assistance from [employer] to get atovaquone. If it gets declined - will treat with clinda/primaquine. He needs 13 d of abtx, then we will have him take prophylaxis with bactrim DS daily (  which he should tolerate). Plan to continue with steroid taper, currently on pred 40mg  daily. Will need supplemental oxygen with ambulation  oi proph = take azithromycin 1200mg  Qmondays  Oral/eso candidiasis = continue fluconazole 400mg  daily x 7-10d. Will follow up on his next visit at ID clinic  [facility] Cell: [phone] Pager: [phone]  10/29/2018, 4:39 PM

## 2018-10-29 NOTE — Addendum Note (Signed)
Addended by: Gildardo GriffesHILL, Aleshia Cartelli M on: 10/29/2018 01:44 PM   Modules accepted: Orders

## 2018-10-29 NOTE — Discharge Summary (Signed)
Physician Discharge Summary  Tarl Cephas WJX:[id number] DOB: 1981-09-21 DOA: 10/20/2018  PCP: Kallie Locks, FNP  Admit date: 10/20/2018 Discharge date: 10/29/2018  Admitted From: Home  Disposition:  Home   Recommendations for Outpatient Follow-up:  1. Follow up with ID in 3 days 2. Follow up with Primary Care in 1 week 3. Please obtain BMP in 3 days 4. Please wean O2 as able     Home Health: None  Equipment/Devices: Supplemental O2  Discharge Condition: Good  CODE STATUS: FULL Diet recommendation: Regular  Brief/Interim Summary: [name] is a 37 y.o. M with no sig PMHx who presents with dyspnea, found to have acute hypoxic respiratory failure, sepsis, CXR with multifocal pneumonia, also leukopenia and anemia.  Admitted and started on empiric antibiotics for pneumonia.     PRINCIPAL HOSPITAL DIAGNOSIS: Pneumocystis pneumonia    Discharge Diagnoses:   Respiratory failure with hypoxia from Adenovirus pneumonia and PJP Sepsis from Adenovirus, pneumocytis pneumonia  Patient admitted with acute hypoxic respiratory failure, fever, tachypnea, tachycardia.  RVP positive for adenovirus.  Blood cultures negative.  Also started empiric treatment for PJP pneumonia, subsequent HIV Ab positive.  ID consulted, transitioned to IV Bactrim with prednisone.  Hyperkalemia was persistent with Bactrim, and so he was transitioned to atovaquone.  PAP program provided 3 weeks atovaquone at discharge.  Weaned off O2 at rest, but still requiring supplemental O2 low levels with ambulation at discharge.  To be weaned off by ID or PCP as outpatient.     Will complete atovaquone and prednisone taper as outpatient.     Thrush Improved with PO fluconazole 2 weeks to end Dec 20  HIV, new diagnosis ID consulted, started on Biktarvy, weekly azithromycin.  Has follow up on Friday.  [employer] application in process.   Hyperkalemia Trended up with Bactrim.  Bactrim stopped.  Anemia of  chronic disease and anemia of iron deficiency Normocytic, hypoproliferative. Ferritin elevated, but iron sat very low.  Leukopenia From HIV  Transaminitis  Elevated troponin Demand supply mismatch in setting of severe hypoxic respiratory failure.  No suspicion for ACS.  No further ischemic work up recommended at this time.            Discharge Instructions  Discharge Instructions    Diet general   Complete by:  As directed    Discharge instructions   Complete by:  As directed    From Dr. Maryfrances Bunnell: For the pneumocystis pneumonia: Take atovaquone liquid 750 mg twice daily for 3 weeks Take prednisone 40 mg for 3 more days (until Dec 14) then 20 mg for 11 days to finish the end of it Dec 25  Take fluconazole for thrush, 400 mg once daily until Dec. 20  Take Biktarvy daily Take Azithromycin 1200 mg weekly for infection prevention.  Dr. Drue Second will refill this.    For anemia, take iron, every other day.  Take with a stool softener.  Take separate from other medications.   Increase activity slowly   Complete by:  As directed      Allergies as of 10/29/2018   No Known Allergies     Medication List    STOP taking these medications   albuterol 108 (90 Base) MCG/ACT inhaler Commonly known as:  PROVENTIL HFA;VENTOLIN HFA   chlorpheniramine-HYDROcodone 10-8 MG/5ML Suer Commonly known as:  TUSSIONEX   clindamycin 300 MG capsule Commonly known as:  CLEOCIN   HYDROcodone-acetaminophen 5-325 MG tablet Commonly known as:  NORCO/VICODIN   ibuprofen 200 MG tablet Commonly  known as:  ADVIL,MOTRIN   multivitamin with minerals Tabs tablet   VITAMIN C PO     TAKE these medications   atovaquone 750 MG/5ML suspension Commonly known as:  MEPRON Take 5 mLs (750 mg total) by mouth 2 (two) times daily with a meal for 15 days.   azithromycin 600 MG tablet Commonly known as:  ZITHROMAX Take 2 tablets (1,200 mg total) by mouth once a week. On Mondays. Start taking  on:  11/03/2018   bictegravir-emtricitabine-tenofovir AF 50-200-25 MG Tabs tablet Commonly known as:  BIKTARVY Take 1 tablet by mouth daily.   ferrous sulfate 325 (65 FE) MG tablet Take 1 tablet (325 mg total) by mouth every other day. Start taking on:  10/31/2018   fluconazole 200 MG tablet Commonly known as:  DIFLUCAN Take 2 tablets (400 mg total) by mouth daily. Start taking on:  10/30/2018   predniSONE 20 MG tablet Commonly known as:  DELTASONE Take 40 mg (2 tabs) for 3 days then 20 mg (1 tab) for 11 days            Durable Medical Equipment  (From admission, onward)         Start     Ordered   10/28/18 1553  DME Oxygen  Once    Question Answer Comment  Mode or (Route) Nasal cannula   Liters per Minute 1   Frequency Continuous (stationary and portable oxygen unit needed)   Oxygen conserving device Yes   Oxygen delivery system Gas      10/28/18 1552         Follow-up Information    [facility] for Infectious Disease Follow up on 10/31/2018.   Specialty:  Infectious Diseases Why:  Appointment with Rexene Alberts, NP @ 10:30 am. Please arrive 30 min early to meet with our financial team. Kindly call to reschedule if unable to keep appointment.  Contact information: [address] [place], [address] [id number] mc [place] [address] [phone]       [facility]. Schedule an appointment as soon as possible for a visit.   Contact information: [address] [place] [address]       [facility]. Schedule an appointment as soon as possible for a visit.   Specialty:  Internal Medicine Contact information: [address] [place] [address] [phone]       [facility]. Schedule an appointment as soon as possible for a visit.   Contact information: [place]  [address] [phone]       [facility]. Schedule an appointment as soon as possible for a visit.   Why:  Can get medicines here. Contact information: [address] [place] [address] [phone]       [facility] Follow up.   Specialty:  Home Health Services Why:  [facility] physical therapy Contact information: [address] [place] [address] [phone]        [facility] - Dme Follow up.   Why:  Home oxygen Contact information: [address] [place] [address] [phone]          No Known Allergies  Consultations:  Infectious Disease   Procedures/Studies: Dg Chest 2 View  Result Date: 10/20/2018 CLINICAL DATA:  Shortness of breath EXAM: CHEST - 2 VIEW COMPARISON:  None. FINDINGS: Patchy bilateral  airspace opacities are noted, right slightly greater than left concerning for pneumonia. Heart is normal size. No effusions or acute bony abnormality. IMPRESSION: Patchy bilateral airspace disease concerning for multifocal pneumonia. Electronically Signed   By: Charlett Nose M.D.   On: 10/20/2018 21:15   Ct Angio Chest Pe W Or Wo Contrast  Result Date: 10/21/2018 CLINICAL DATA:  Shortness of breath, productive cough. EXAM: CT ANGIOGRAPHY CHEST WITH CONTRAST TECHNIQUE: Multidetector CT imaging of the chest was performed using the standard protocol during bolus administration of intravenous contrast. Multiplanar CT image reconstructions and MIPs were obtained to evaluate the vascular anatomy. CONTRAST:  ISOVUE-370 IOPAMIDOL (ISOVUE-370) INJECTION 76% COMPARISON:  Chest x-ray 10/20/2018 FINDINGS: Cardiovascular: Heart is borderline in size. Aorta is normal caliber. No filling defects in the pulmonary arteries to suggest pulmonary emboli. Mediastinum/Nodes: No mediastinal, hilar, or axillary adenopathy. Lungs/Pleura: Bilateral airspace opacities, most confluent  throughout the right lung and in the left lower lobe concerning for multifocal pneumonia. No effusions. Upper Abdomen: Imaging into the upper abdomen shows no acute findings. Musculoskeletal: Chest wall soft tissues are unremarkable. No acute bony abnormality. Review of the MIP images confirms the above findings. IMPRESSION: No evidence of pulmonary embolus. Multifocal bilateral airspace opacities, right greater than left concerning for multifocal pneumonia. Electronically Signed   By: Charlett Nose M.D.   On: 10/21/2018 01:01   US Abdomen Complete  Result Date: 10/21/2018 CLINICAL DATA:  Elevated liver function tests. EXAM: ABDOMEN ULTRASOUND COMPLETE COMPARISON:  None. FINDINGS: Gallbladder: No gallstones or wall thickening visualized. No sonographic Murphy sign noted by sonographer. Common bile duct: Diameter: 4 mm. Liver: No focal lesion identified. Within normal limits in parenchymal echogenicity. Portal vein is patent on color Doppler imaging with normal direction of blood flow towards the liver. IVC: No abnormality visualized. Pancreas: Visualized portion unremarkable. Spleen: Size and appearance within normal limits. Right Kidney: Length: 10.9 cm. Echogenicity within normal limits. No mass or hydronephrosis visualized. Left Kidney: Length: 11.7 cm. Echogenicity within normal limits. No mass or hydronephrosis visualized. Abdominal aorta: No aneurysm visualized. Other findings: None. IMPRESSION: Normal abdominal ultrasound. Electronically Signed   By: Awilda Metro M.D.   On: 10/21/2018 01:27      Subjective: Feeling well.  No more cough, no chest pain.  Moderate dyspnea with exertion.  No confusion, fever, dizziness.  Discharge Exam: Vitals:   10/28/18 2245 10/29/18 0600  BP: 133/90 (!) 143/82  Pulse: 77 87  Resp: 16 16  Temp: 97.8 F (36.6 C) 98.2 F (36.8 C)  SpO2: 92% 98%   Vitals:   10/28/18 0541 10/28/18 1403 10/28/18 2245 10/29/18 0600  BP: 132/72 137/70 133/90 (!) 143/82   Pulse: 80 94 77 87  Resp: 16 18 16 16   Temp: 97.7 F (36.5 C)  97.8 F (36.6 C) 98.2 F (36.8 C)  TempSrc: Oral  Oral Oral  SpO2: 95% 93% 92% 98%  Weight:      Height:        General: Pt is alert, awake, not in acute distress Cardiovascular: RRR, nl S1-S2, no murmurs appreciated.   No LE edema.   Respiratory: Normal respiratory rate and rhythm.  Rales bilatearlly, no wheezing. Abdominal: Abdomen soft and non-tender.  No distension or HSM.   Neuro/Psych: Strength symmetric in upper and lower extremities.  Judgment and insight appear normal.   The results of significant diagnostics from this hospitalization (including imaging, microbiology, ancillary and laboratory) are listed below for reference.     Microbiology: Recent Results (from  the past 240 hour(s))  Blood Culture (routine x 2)     Status: None   Collection Time: 10/20/18 10:11 PM  Result Value Ref Range Status   Specimen Description   Final    BLOOD RIGHT ANTECUBITAL Performed at [facility], [address], [place], [place] [address]    Special Requests   Final    BOTTLES DRAWN AEROBIC AND ANAEROBIC Blood Culture adequate volume Performed at [facility], [address], [place], [place] [address]    Culture   Final    NO GROWTH 5 DAYS Performed at [facility], [address] [place], [place] [address]    Report Status 10/26/2018 FINAL  Final  Blood Culture (routine x 2)     Status: None   Collection Time: 10/20/18 10:11 PM  Result Value Ref Range Status   Specimen Description   Final    BLOOD LEFT ANTECUBITAL Performed at [facility], [address], [place], [place] [address]    Special Requests   Final    BOTTLES DRAWN AEROBIC AND ANAEROBIC Blood Culture adequate volume Performed at [facility], [address], [place], [place] [address]    Culture   Final    NO GROWTH 5 DAYS Performed at [facility],  [address], [place], [place] [address]    Report Status 10/26/2018 FINAL  Final  Respiratory Panel by PCR     Status: Abnormal   Collection Time: 10/20/18 11:45 PM  Result Value Ref Range Status   Adenovirus DETECTED (A) NOT DETECTED Final   Coronavirus 229E NOT DETECTED NOT DETECTED Final   Coronavirus HKU1 NOT DETECTED NOT DETECTED Final   Coronavirus NL63 NOT DETECTED NOT DETECTED Final   Coronavirus OC43 NOT DETECTED NOT DETECTED Final   Metapneumovirus NOT DETECTED NOT DETECTED Final   Rhinovirus / Enterovirus NOT DETECTED NOT DETECTED Final   Influenza A NOT DETECTED NOT DETECTED Final   Influenza B NOT DETECTED NOT DETECTED Final   Parainfluenza Virus 1 NOT DETECTED NOT DETECTED Final   Parainfluenza Virus 2 NOT DETECTED NOT DETECTED Final   Parainfluenza Virus 3 NOT DETECTED NOT DETECTED Final   Parainfluenza Virus 4 NOT DETECTED NOT DETECTED Final   Respiratory Syncytial Virus NOT DETECTED NOT DETECTED Final   Bordetella pertussis NOT DETECTED NOT DETECTED Final   Chlamydophila pneumoniae NOT DETECTED NOT DETECTED Final   Mycoplasma pneumoniae NOT DETECTED NOT DETECTED Final    Comment: Performed at [facility], [address], [place], [place] [address]  MRSA PCR Screening     Status: None   Collection Time: 10/21/18  1:50 AM  Result Value Ref Range Status   MRSA by PCR NEGATIVE NEGATIVE Final    Comment:        The GeneXpert MRSA Assay (FDA approved for NASAL specimens only), is one component of a comprehensive MRSA colonization surveillance program. It is not intended to diagnose MRSA infection nor to guide or monitor treatment for MRSA infections. Performed at [facility], [address], [place], [place] [address]   Pneumocystis smear by DFA     Status: None   Collection Time: 10/21/18  4:24 AM  Result Value Ref Range Status   Specimen Source-PJSRC SPUTUM  Final   Pneumocystis jiroveci Ag NEGATIVE  Final    Comment: Performed at [facility] Performed at [facility] Performed at [facility], [address] [place], [place] [address]   Culture, sputum-assessment     Status: None   Collection Time: 10/21/18  4:27 AM  Result Value Ref Range Status   Specimen Description SPUTUM  Final   Special Requests Immunocompromised  Final   Sputum evaluation   Final    Sputum specimen not acceptable for testing.  Please recollect.   INFORMED KAITLYN @0508  ON [facility].3.19 BY [facility] Performed at [facility], [address] [place], [place] [address]    Report Status 10/21/2018 FINAL  Final     Labs: BNP (last 3 results) No results for input(s): BNP in the last 8760 hours. Basic Metabolic Panel: Recent Labs  Lab 10/25/18 0416 10/26/18 0412 10/27/18 0423 10/28/18 0507 10/29/18 0453  NA 130* 131* 131* 130* 131*  K 5.2* 5.9* 4.6 5.0 5.5*  CL 101 102 101 100 98  CO2 20* 19* 20* 20* 22  GLUCOSE 108* 105* 100* 102* 114*  BUN 21* 25* 26* 23* 24*  CREATININE 1.08 1.21 1.19 1.03 1.08  CALCIUM 9.2 9.6 9.7 9.8 10.5*   Liver Function Tests: No results for input(s): AST, ALT, ALKPHOS, BILITOT, PROT, ALBUMIN in the last 168 hours. No results for input(s): LIPASE, AMYLASE in the last 168 hours. No results for input(s): AMMONIA in the last 168 hours. CBC: Recent Labs  Lab 10/24/18 0557 10/26/18 0412 10/26/18 1029 10/27/18 0423 10/28/18 0507 10/29/18 0453  WBC 2.9* 4.3  --  5.4 5.6 6.3  HGB 8.7* 9.1* 9.2* 9.3* 9.2* 9.8*  HCT 27.7* 28.6*  --  30.5* 29.9* 31.2*  MCV 89.1 88.0  --  86.6 87.9 87.4  PLT 391 461*  --  614* 567* 644*   Cardiac Enzymes: No results for input(s): CKTOTAL, CKMB, CKMBINDEX, TROPONINI in the last 168 hours. BNP: Invalid input(s): POCBNP CBG: No results for input(s): GLUCAP in the last 168 hours. D-Dimer No results for input(s): DDIMER in the last 72 hours. Hgb A1c No results for input(s): HGBA1C in the last 72 hours. Lipid  Profile No results for input(s): CHOL, HDL, LDLCALC, TRIG, CHOLHDL, LDLDIRECT in the last 72 hours. Thyroid function studies No results for input(s): TSH, T4TOTAL, T3FREE, THYROIDAB in the last 72 hours.  Invalid input(s): FREET3 Anemia work up No results for input(s): VITAMINB12, FOLATE, FERRITIN, TIBC, IRON, RETICCTPCT in the last 72 hours. Urinalysis    Component Value Date/Time   COLORURINE YELLOW 10/20/2018 2211   APPEARANCEUR CLEAR 10/20/2018 2211   LABSPEC 1.016 10/20/2018 2211   PHURINE 6.0 10/20/2018 2211   GLUCOSEU NEGATIVE 10/20/2018 2211   HGBUR NEGATIVE 10/20/2018 2211   BILIRUBINUR NEGATIVE 10/20/2018 2211   KETONESUR 5 (A) 10/20/2018 2211   PROTEINUR 100 (A) 10/20/2018 2211   NITRITE NEGATIVE 10/20/2018 2211   LEUKOCYTESUR NEGATIVE 10/20/2018 2211   Sepsis Labs Invalid input(s): PROCALCITONIN,  WBC,  LACTICIDVEN Microbiology Recent Results (from the past 240 hour(s))  Blood Culture (routine x 2)     Status: None   Collection Time: 10/20/18 10:11 PM  Result Value Ref Range Status   Specimen Description   Final    BLOOD RIGHT ANTECUBITAL Performed at [facility], [address] [place], [place] [address]    Special Requests   Final    BOTTLES DRAWN AEROBIC AND ANAEROBIC Blood Culture adequate volume Performed at [facility], [address] [place], [place] [address]    Culture   Final    NO GROWTH 5 DAYS Performed at [facility], [address] [place], [place] [address]    Report Status 10/26/2018 FINAL  Final  Blood Culture (routine x 2)     Status: None   Collection Time: 10/20/18 10:11 PM  Result Value Ref Range Status   Specimen Description   Final    BLOOD LEFT ANTECUBITAL Performed at [facility], [address], [place], [place] [address]    Special Requests   Final    BOTTLES DRAWN AEROBIC AND ANAEROBIC Blood Culture adequate volume Performed at [facility], [address], [place], [place] [address]    Culture   Final    NO GROWTH 5 DAYS Performed at [facility], [address], [place], [place] [address]    Report Status 10/26/2018 FINAL  Final  Respiratory Panel by PCR     Status: Abnormal   Collection Time: 10/20/18 11:45 PM  Result Value Ref Range Status   Adenovirus DETECTED (A) NOT DETECTED Final   Coronavirus 229E NOT DETECTED NOT DETECTED Final   Coronavirus HKU1 NOT DETECTED NOT DETECTED Final   Coronavirus NL63 NOT DETECTED NOT DETECTED Final   Coronavirus OC43 NOT DETECTED NOT DETECTED Final   Metapneumovirus NOT DETECTED NOT DETECTED Final   Rhinovirus / Enterovirus NOT DETECTED NOT DETECTED Final   Influenza A NOT DETECTED NOT DETECTED Final   Influenza B NOT DETECTED NOT DETECTED Final   Parainfluenza Virus 1 NOT DETECTED NOT DETECTED Final   Parainfluenza Virus 2 NOT DETECTED NOT DETECTED Final   Parainfluenza Virus 3 NOT DETECTED NOT DETECTED Final   Parainfluenza Virus 4 NOT DETECTED NOT DETECTED Final   Respiratory Syncytial Virus NOT DETECTED NOT DETECTED Final   Bordetella pertussis NOT DETECTED NOT DETECTED Final   Chlamydophila pneumoniae NOT DETECTED NOT DETECTED Final   Mycoplasma pneumoniae NOT DETECTED NOT DETECTED Final    Comment: Performed at [facility], [address] [place], [place] [address]  MRSA PCR Screening     Status: None   Collection Time: 10/21/18  1:50 AM  Result Value Ref Range Status   MRSA by PCR NEGATIVE NEGATIVE Final    Comment:        The GeneXpert MRSA Assay (FDA approved for NASAL specimens only), is one component of a comprehensive MRSA colonization surveillance program. It is not intended to diagnose MRSA infection nor to guide or monitor treatment for MRSA infections. Performed at [facility], [address], [place], [place] [address]   Pneumocystis smear by DFA     Status: None   Collection Time: 10/21/18  4:24 AM  Result  Value Ref Range Status   Specimen Source-PJSRC SPUTUM  Final   Pneumocystis jiroveci Ag NEGATIVE  Final    Comment: Performed at [facility] Performed at [facility] Performed at [facility], [address], [place], [place] [address]   Culture, sputum-assessment     Status: None   Collection Time: 10/21/18  4:27 AM  Result Value Ref Range Status   Specimen Description SPUTUM  Final   Special Requests Immunocompromised  Final   Sputum evaluation   Final    Sputum specimen not acceptable for testing.  Please recollect.   INFORMED KAITLYN @0508  ON 12.3.19 BY [facility] Performed at [facility], [address] [place], [place] [address]    Report Status 10/21/2018 FINAL  Final     Time coordinating discharge: 50 minutes  SIGNED:   Alberteen Sam, MD  [facility] 10/29/2018, 1:45 PM

## 2018-10-29 NOTE — Progress Notes (Addendum)
Pharmacy - Brief Note  Due to Hyperkalemia, TMP/SMZ changed to atovaquone for PCP treatment.  Due to expense, unable to provide patient supply of Atovaquone through [facility] resources (ie. MATCH program).  On 12/10,  patient assistance application was completed through ([employer] of Mepron) for free supply to complete treatment course.  Awaiting approval from mfg.  If approved a prescription [facility] location).    Addendum: Approval for medication was received from [employer].  [facility]  They will need to order it and will get it in tomorrow.  Patient aware.  A dose will be provided to patient so does not miss a dose.    Juliette Alcideustin Verlean [place], PharmD, BCPS.   Work Cell: [phone] 10/29/2018 7:51 AM

## 2018-10-29 NOTE — Addendum Note (Signed)
Addended by: Gildardo GriffesHILL, Arnitra Sokoloski M on: 10/29/2018 10:58 AM   Modules accepted: Orders

## 2018-10-29 NOTE — Progress Notes (Signed)
Post d/c enrty-[facility]-informed Alex Mitchell that initially med was over the [facility] program amt, & pharmacy was getting approval through the [employer] patient asst program but now [facility] is asking for asst w/MATCH program-faxed  to [phone].Alex Mitchell is aware of the $3 co pay needed.

## 2018-10-30 MED FILL — ATOVAQUONE 750 MG/5ML SUSP: 750 | 30 days supply | Qty: 210 | Fill #0

## 2018-10-31 ENCOUNTER — Ambulatory Visit (INDEPENDENT_AMBULATORY_CARE_PROVIDER_SITE_OTHER): Payer: Self-pay | Admitting: Infectious Diseases

## 2018-10-31 ENCOUNTER — Other Ambulatory Visit: Payer: Self-pay

## 2018-10-31 ENCOUNTER — Ambulatory Visit: Payer: Self-pay

## 2018-10-31 ENCOUNTER — Encounter: Payer: Self-pay | Admitting: Infectious Diseases

## 2018-10-31 VITALS — BP 120/81 | HR 120 | Temp 98.3°F | Ht 68.0 in | Wt 193.0 lb

## 2018-10-31 DIAGNOSIS — B59 Pneumocystosis: Secondary | ICD-10-CM | POA: Insufficient documentation

## 2018-10-31 DIAGNOSIS — Z0000 Encounter for general adult medical examination without abnormal findings: Secondary | ICD-10-CM | POA: Insufficient documentation

## 2018-10-31 DIAGNOSIS — M79669 Pain in unspecified lower leg: Secondary | ICD-10-CM

## 2018-10-31 DIAGNOSIS — E875 Hyperkalemia: Secondary | ICD-10-CM

## 2018-10-31 DIAGNOSIS — B20 Human immunodeficiency virus [HIV] disease: Secondary | ICD-10-CM

## 2018-10-31 DIAGNOSIS — B3781 Candidal esophagitis: Secondary | ICD-10-CM | POA: Insufficient documentation

## 2018-10-31 HISTORY — DX: Human immunodeficiency virus (HIV) disease: B20

## 2018-10-31 NOTE — Progress Notes (Signed)
Name: Alex Mitchell  DOB: [birth year] MRN: [id number] PCP: Kallie Locks, FNP   Patient Active Problem List   Diagnosis Date Noted  . Hyperkalemia 11/02/2018  . Calf tenderness 11/02/2018  . Healthcare maintenance 10/31/2018  . Esophageal candidiasis (HCC) 10/31/2018  . 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 AIDS (acquired immune deficiency syndrome) (HCC) 10/31/2018  . Leukopenia 10/21/2018  . Anemia 10/21/2018  . Acute respiratory failure with hypoxia (HCC) 10/21/2018  . Proteinuria 10/21/2018  . Elevated LFTs 10/21/2018     Brief Narrative:  Alex Mitchell is a 37 y.o. male with HIV infection (+) AIDS with CD4 6, VL 770,000 copies; dx during hospitalization 09-2018 with PJP pneumonia. HIV Risk: MSM. History of OIs: PJP pna, esophageal candidiasis   Previous Regimens: . Biktarvy 09-2018  Genotypes: . Not performed   Subjective:  CC:  New patient here for hospital discharge follow up and new HIV/AIDS diagnosis. Feeling much better. Has a few questions about his condition. He is accompanied by a close friend today and permission to discuss all aspects of his health has been granted.   HPI:  Alex Mitchell is feeling a lot better than when he was hospitalized recently. He is no longer requiring oxygen at home and is able to walk farther without getting as winded in the home. His appetite is returning. He has bilateral tightness in both posterior calves. No swelling or tenderness with touch. Massage and calf presses/stretches help.   Hospitalization records reviewed - no significant past medical history. He presented with severe atypical pneumonia with hypoxia after experiencing 1-2 weeks of worsening DOE and cough. In the ER found to have fever, tachycardia (130s), leukopenia, CXR with diffuse interstitial infiltrates b/l. RVP (+) adenovirus, flu negative. Legionella and strep pneumo urine Ag were negative. Oral thrush was also present with dysphagia/odonophagia.  HIV labs were checked and returned positive. No previously known STIs but has never had HIV testing before. Reports MSM; denies IVDU. Last sexual encounter was 13 years prior per his report.  He was started empirically on bactrim PJP dosing + steroids. Although PJP DFA smear was negative he has significantly improved on therapy. He was started on Biktarvy while inpatient and has not missed a dose since hospital discharge. He has also been started on weekly azithromycin for OI proph and fluconazole 400 mg daily for oral/esophageal candidiasis. His swallowing has improved significantly and now has "a nice pink tongue." He was transitioned to atovaquone 750 mg BID x 15 more days at discharge d/t hyperkalemia.   Review of Systems  Constitutional: Negative for chills, fever, malaise/fatigue and weight loss.  HENT: Negative for congestion and sore throat.        No dental problems  Respiratory: Positive for cough (improved ) and shortness of breath. Negative for sputum production (no further sputum ) and wheezing.   Cardiovascular: Positive for palpitations. Negative for chest pain and leg swelling.  Gastrointestinal: Negative for abdominal pain, diarrhea and vomiting.  Genitourinary: Negative for dysuria and flank pain.  Musculoskeletal: Negative for joint pain, myalgias and neck pain.  Skin: Negative for rash.  Neurological: Negative for dizziness, tingling and headaches.  Psychiatric/Behavioral: Negative for depression and substance abuse. The patient is not nervous/anxious and does not have insomnia.       Outpatient Medications Prior to Visit  Medication Sig Dispense Refill  . atovaquone (MEPRON) 750 MG/5ML suspension Take 5 mLs (750 mg total) by mouth 2 (two) times daily with a meal for 15 days.  210 mL 0  . [START ON 11/03/2018] azithromycin (ZITHROMAX) 600 MG tablet Take 2 tablets (1,200 mg total) by mouth once a week. On Mondays. 8 tablet 0  . bictegravir-emtricitabine-tenofovir AF  (BIKTARVY) 50-200-25 MG TABS tablet Take 1 tablet by mouth daily. 30 tablet 1  . ferrous sulfate 325 (65 FE) MG tablet Take 1 tablet (325 mg total) by mouth every other day. 15 tablet 3  . fluconazole (DIFLUCAN) 200 MG tablet Take 2 tablets (400 mg total) by mouth daily. 18 tablet 0  . predniSONE (DELTASONE) 20 MG tablet Take 40 mg (2 tabs) for 3 days then 20 mg (1 tab) for 11 days 17 tablet 0   No facility-administered medications prior to visit.      No Known Allergies  Social History   Tobacco Use  . Smoking status: Never Smoker  . Smokeless tobacco: Never Used  Substance Use Topics  . Alcohol use: Not Currently  . Drug use: No    Family History  Problem Relation Age of Onset  . Hypertension Other   . Diabetes Neg Hx     Social History   Substance and Sexual Activity  Sexual Activity Not on file     Objective:   Vitals:   10/31/18 1028  BP: 120/81  Pulse: (!) 120  Temp: 98.3 F (36.8 C)  TempSrc: Oral  SpO2: 97%  Weight: 193 lb (87.5 kg)  Height: 5\' 8"  (1.727 m)  Pulse Ox on room air. Repeated heart rate low 100s after sitting.   Body mass index is 29.35 kg/m.  Physical Exam Vitals signs reviewed.  Constitutional:      Appearance: Normal appearance.     Comments: Seated comfortably in chair in no distress. Close friend is present. Smiling and able to converse w/o breathlessness.   HENT:     Head: Normocephalic.     Nose: No congestion.     Mouth/Throat:     Mouth: Mucous membranes are moist. No oral lesions.     Dentition: Normal dentition. No dental caries.     Pharynx: Oropharynx is clear. No oropharyngeal exudate.  Eyes:     General: No scleral icterus. Cardiovascular:     Rate and Rhythm: Regular rhythm. Tachycardia present.     Heart sounds: Normal heart sounds. No murmur.  Pulmonary:     Effort: Pulmonary effort is normal. No respiratory distress.     Comments: Diminished bibasilar breath sounds. Shallow inspiratory effort. No  rales/rhonchi. No cough demonstrated on exam.  Abdominal:     General: There is no distension.     Palpations: Abdomen is soft.     Tenderness: There is no abdominal tenderness.  Musculoskeletal:        General: Tenderness (bilateral calfs at lower gastrocnemius muscles. Negative Homan's sign. No warmth, swelling or erythema. ) present. No swelling.  Lymphadenopathy:     Cervical: No cervical adenopathy.  Skin:    General: Skin is warm and dry.     Findings: No rash.  Neurological:     Mental Status: He is alert and oriented to person, place, and time.     Lab Results Lab Results  Component Value Date   WBC 6.3 10/29/2018   HGB 9.8 (L) 10/29/2018   HCT 31.2 (L) 10/29/2018   MCV 87.4 10/29/2018   PLT 644 (H) 10/29/2018    Lab Results  Component Value Date   CREATININE 1.08 10/29/2018   BUN 24 (H) 10/29/2018   NA 131 (L) 10/29/2018  K 5.5 (H) 10/29/2018   CL 98 10/29/2018   CO2 22 10/29/2018    Lab Results  Component Value Date   ALT 83 (H) 10/21/2018   AST 93 (H) 10/21/2018   ALKPHOS 132 (H) 10/21/2018   BILITOT 0.5 10/21/2018    No results found for: CHOL, HDL, LDLCALC, LDLDIRECT, TRIG, CHOLHDL HIV 1 RNA Quant (copies/mL)  Date Value  10/21/2018 770,000   CD4 T Cell Abs (/uL)  Date Value  10/24/2018 6 (L)    Assessment & Plan:   Problem List Items Addressed This Visit      Unprioritized   AIDS (acquired immune deficiency syndrome) (HCC) - Primary    New patient here to establish for HIV care. He has been working with Dr. Drue Second during recent hospitalization.  I discussed with Archie Patten treatment options/side effects, benefits of treatment and long-term outcomes. I discussed how HIV is transmitted and the process of untreated HIV including increased risk for opportunistic infections, cancer, dementia and renal failure. Patient was counseled on routine HIV care including medication adherence, blood monitoring, necessary vaccines and follow up visits.  Counseled regarding safe sex practices including: condom use, partner disclosure, limiting partners. Patient spent time talking with our pharmacist Cassie regarding successful practices of ART and understands to reach out to our clinic in the future with questions.   Will continue Biktarvy for him. He has had emergency RW/UMAP approved already thanks to the effort of our financial coordinators. He has all medications for his HIV treatment, candidiasis, opportunistic prophylaxis and current PJP treatment. We discussed that he will need to re-apply every January and July.   General introduction to our clinic and integrated services. Introduced to [place] and [facility] today. Dental referral discussed today - he will need dental referral to [facility] clinic in the future when his CD4 is higher and VL is suppressed.   I spent greater than 60 minutes with the patient today. Greater than 75% of the time spent face-to-face counseling and coordination of care re: HIV and health maintenance.        Relevant Medications   sulfamethoxazole-trimethoprim (BACTRIM DS,SEPTRA DS) 800-160 MG tablet (Start on 11/14/2018)   Calf tenderness    Exam is reassuring for muscular etiology and not DVT. Encouraged ongoing massage and physical therapy/ambulating in the home. Precautions discussed.       Esophageal candidiasis (HCC)    Symptoms resolved with treatment - will keep his course of treatment at 10 days considering his LFTs were a bit elevated.       Relevant Medications   sulfamethoxazole-trimethoprim (BACTRIM DS,SEPTRA DS) 800-160 MG tablet (Start on 11/14/2018)   Healthcare maintenance    He declined flu vaccine today - counseled on vaccines for [facility]WH. He will consider for the next visit. Will ask his primary team to offer. Discussed flu prevention.  He will when his CD4 count > 200 need prevnar, pneumovax, menveo, hep a and b series.       Hyperkalemia    Likely in the setting of high dose bactrim use - discussed  today. I asked him to hold on his MVI until his labs are repeated - he requested over and over to not have venipuncture today being he was just discharged Wednesday. I reviewed and provided high potassium containing food hand out and asked him to please avoid for now. He has an appointment for hospital follow up with his PCP early next week - will ask her to please repeat a CMET at that  time.       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 He has a few more days of 40 mg daily on his prednisone and then understands to finish with 20 mg daily to complete 21 days total; continue atovaquone BID x 21 days (through 11/13/18). He will need to resume bactrim 1 DS tab 3x weekly for prophylaxis after completion.       Relevant Medications   sulfamethoxazole-trimethoprim (BACTRIM DS,SEPTRA DS) 800-160 MG tablet (Start on 11/14/2018)     He will return in 3 weeks to see Dr. Drue Second for further assessment/monitoring and repeat HIV VL - needs genotype reflexed this draw. He can also re-apply for UMAP at this visit. I am hopeful that he will allow his primary care provider to repeat a CMET at upcoming appointment early next week.   Rexene Alberts, MSN, NP-C [facility] Pager: [phone] Office: [phone]

## 2018-10-31 NOTE — Patient Instructions (Addendum)
So nice to meet you today!   Continue your prednisone as we discussed today - you should have 2-3 more days of the 40 mg (2 tabs) once a day and then continue on 20 mg (1 tab) every day until gone.   Continue your fluconazole until gone.   Continue your Mepron twice a day until gone.   Continue your Biktarvy once a day as you are doing now - please separate from your iron by 6 hours (try taking this after your dinner). Continue every other day for now.   Would stop your multivitamin until you get your blood work back with new potassium level (would have your primary care provider check this at your upcoming appointment on the 18th.   Can do your flu shot at your primary care appointment on Dec 18th or we can give it to you here.    Please come back in 3 weeks to see Dr. Drue Second and repeat your blood work for your HIV.    Potassium Content of Foods Potassium is a mineral found in many foods and drinks. It helps keep fluids and minerals balanced in your body and affects how steadily your heart beats. Potassium also helps control your blood pressure and keep your muscles and nervous system healthy. Certain health conditions and medicines may change the balance of potassium in your body. When this happens, you can help balance your level of potassium through the foods that you do or do not eat. Your health care provider or dietitian may recommend an amount of potassium that you should have each day. The following lists of foods provide the amount of potassium (in parentheses) per serving in each item. High in potassium The following foods and beverages have 200 mg or more of potassium per serving:  Apricots, 2 raw or 5 dry (200 mg).  Artichoke, 1 medium (345 mg).  Avocado, raw,  each (245 mg).  Banana, 1 medium (425 mg).  Beans, lima, or baked beans, canned,  cup (280 mg).  Beans, white, canned,  cup (595 mg).  Beef roast, 3 oz (320 mg).  Beef, ground, 3 oz (270 mg).  Beets, raw  or cooked,  cup (260 mg).  Bran muffin, 2 oz (300 mg).  Broccoli,  cup (230 mg).  Brussels sprouts,  cup (250 mg).  Cantaloupe,  cup (215 mg).  Cereal, 100% bran,  cup (200-400 mg).  Cheeseburger, single, fast food, 1 each (225-400 mg).  Chicken, 3 oz (220 mg).  Clams, canned, 3 oz (535 mg).  Crab, 3 oz (225 mg).  Dates, 5 each (270 mg).  Dried beans and peas,  cup (300-475 mg).  Figs, dried, 2 each (260 mg).  Fish: halibut, tuna, cod, snapper, 3 oz (480 mg).  Fish: salmon, haddock, swordfish, perch, 3 oz (300 mg).  Fish, tuna, canned 3 oz (200 mg).  [place] fries, fast food, 3 oz (470 mg).  Granola with fruit and nuts,  cup (200 mg).  Grapefruit juice,  cup (200 mg).  Greens, beet,  cup (655 mg).  Honeydew melon,  cup (200 mg).  Kale, raw, 1 cup (300 mg).  Kiwi, 1 medium (240 mg).  Kohlrabi, rutabaga, parsnips,  cup (280 mg).  Lentils,  cup (365 mg).  Mango, 1 each (325 mg).  Milk, chocolate, 1 cup (420 mg).  Milk: nonfat, low-fat, whole, buttermilk, 1 cup (350-380 mg).  Molasses, 1 Tbsp (295 mg).  Mushrooms,  cup (280) mg.  Nectarine, 1 each (275 mg).  Nuts: almonds,  peanuts, hazelnuts, [place], cashew, mixed, 1 oz (200 mg).  Nuts, pistachios, 1 oz (295 mg).  Orange, 1 each (240 mg).  Orange juice,  cup (235 mg).  Papaya, medium,  fruit (390 mg).  Peanut butter, chunky, 2 Tbsp (240 mg).  Peanut butter, smooth, 2 Tbsp (210 mg).  Pear, 1 medium (200 mg).  Pomegranate, 1 whole (400 mg).  Pomegranate juice,  cup (215 mg).  Pork, 3 oz (350 mg).  Potato chips, salted, 1 oz (465 mg).  Potato, baked with skin, 1 medium (925 mg).  Potatoes, boiled,  cup (255 mg).  Potatoes, mashed,  cup (330 mg).  Prune juice,  cup (370 mg).  Prunes, 5 each (305 mg).  Pudding, chocolate,  cup (230 mg).  Pumpkin, canned,  cup (250 mg).  Raisins, seedless,  cup (270 mg).  Seeds, sunflower or pumpkin, 1 oz (240 mg).  Soy  milk, 1 cup (300 mg).  Spinach,  cup (420 mg).  Spinach, canned,  cup (370 mg).  Sweet potato, baked with skin, 1 medium (450 mg).  Swiss chard,  cup (480 mg).  Tomato or vegetable juice,  cup (275 mg).  Tomato sauce or puree,  cup (400-550 mg).  Tomato, raw, 1 medium (290 mg).  Tomatoes, canned,  cup (200-300 mg).  [place], 3 oz (250 mg).  Wheat germ, 1 oz (250 mg).  Winter squash,  cup (250 mg).  Yogurt, plain or fruited, 6 oz (260-435 mg).  Zucchini,  cup (220 mg).  Moderate in potassium The following foods and beverages have 50-200 mg of potassium per serving:  Apple, 1 each (150 mg).  Apple juice,  cup (150 mg).  Applesauce,  cup (90 mg).  Apricot nectar,  cup (140 mg).  Asparagus, small spears,  cup or 6 spears (155 mg).  Bagel, cinnamon raisin, 1 each (130 mg).  Bagel, egg or plain, 4 in., 1 each (70 mg).  Beans, green,  cup (90 mg).  Beans, yellow,  cup (190 mg).  Beer, regular, 12 oz (100 mg).  Beets, canned,  cup (125 mg).  Blackberries,  cup (115 mg).  Blueberries,  cup (60 mg).  Bread, whole wheat, 1 slice (70 mg).  Broccoli, raw,  cup (145 mg).  Cabbage,  cup (150 mg).  Carrots, cooked or raw,  cup (180 mg).  Cauliflower, raw,  cup (150 mg).  Celery, raw,  cup (155 mg).  Cereal, bran flakes, cup (120-150 mg).  Cheese, cottage,  cup (110 mg).  Cherries, 10 each (150 mg).  Chocolate, 1 oz bar (165 mg).  Coffee, brewed 6 oz (90 mg).  Corn,  cup or 1 ear (195 mg).  Cucumbers,  cup (80 mg).  Egg, large, 1 each (60 mg).  Eggplant,  cup (60 mg).  Endive, raw, cup (80 mg).  English muffin, 1 each (65 mg).  Fish, orange roughy, 3 oz (150 mg).  Frankfurter, beef or pork, 1 each (75 mg).  Fruit cocktail,  cup (115 mg).  Grape juice,  cup (170 mg).  Grapefruit,  fruit (175 mg).  Grapes,  cup (155 mg).  Greens: kale, turnip, collard,  cup (110-150 mg).  Ice cream or frozen yogurt,  chocolate,  cup (175 mg).  Ice cream or frozen yogurt, vanilla,  cup (120-150 mg).  Lemons, limes, 1 each (80 mg).  Lettuce, all types, 1 cup (100 mg).  Mixed vegetables,  cup (150 mg).  Mushrooms, raw,  cup (110 mg).  Nuts: walnuts, pecans, or macadamia, 1 oz (125  mg).  Oatmeal,  cup (80 mg).  Okra,  cup (110 mg).  Onions, raw,  cup (120 mg).  Peach, 1 each (185 mg).  Peaches, canned,  cup (120 mg).  Pears, canned,  cup (120 mg).  Peas, green, frozen,  cup (90 mg).  Peppers, green,  cup (130 mg).  Peppers, red,  cup (160 mg).  Pineapple juice,  cup (165 mg).  Pineapple, fresh or canned,  cup (100 mg).  Plums, 1 each (105 mg).  Pudding, vanilla,  cup (150 mg).  Raspberries,  cup (90 mg).  Rhubarb,  cup (115 mg).  Rice, wild,  cup (80 mg).  Shrimp, 3 oz (155 mg).  Spinach, raw, 1 cup (170 mg).  Strawberries,  cup (125 mg).  Summer squash  cup (175-200 mg).  Swiss chard, raw, 1 cup (135 mg).  Tangerines, 1 each (140 mg).  Tea, brewed, 6 oz (65 mg).  Turnips,  cup (140 mg).  Watermelon,  cup (85 mg).  Wine, red, table, 5 oz (180 mg).  Wine, white, table, 5 oz (100 mg).  Low in potassium The following foods and beverages have less than 50 mg of potassium per serving.  Bread, white, 1 slice (30 mg).  Carbonated beverages, 12 oz (less than 5 mg).  Cheese, 1 oz (20-30 mg).  Cranberries,  cup (45 mg).  Cranberry juice cocktail,  cup (20 mg).  Fats and oils, 1 Tbsp (less than 5 mg).  Hummus, 1 Tbsp (32 mg).  Nectar: papaya, mango, or pear,  cup (35 mg).  Rice, white or brown,  cup (50 mg).  Spaghetti or macaroni,  cup cooked (30 mg).  Tortilla, flour or corn, 1 each (50 mg).  Waffle, 4 in., 1 each (50 mg).  Water chestnuts,  cup (40 mg).  This information is not intended to replace advice given to you by your health care provider. Make sure you discuss any questions you have with your health care  provider. Document Released: 06/19/2005 Document Revised: 04/12/2016 Document Reviewed: 10/02/2013 Elsevier Interactive Patient Education  [employer].

## 2018-11-02 ENCOUNTER — Encounter: Payer: Self-pay | Admitting: Infectious Diseases

## 2018-11-02 DIAGNOSIS — M79669 Pain in unspecified lower leg: Secondary | ICD-10-CM | POA: Insufficient documentation

## 2018-11-02 DIAGNOSIS — E875 Hyperkalemia: Secondary | ICD-10-CM | POA: Insufficient documentation

## 2018-11-02 MED ORDER — SULFAMETHOXAZOLE-TRIMETHOPRIM 800-160 MG PO TABS: 1.0000 | ORAL_TABLET | ORAL | 3 refills | Status: DC

## 2018-11-02 NOTE — Assessment & Plan Note (Signed)
New patient here to establish for HIV care. He has been working with Dr. Drue SecondSnider during recent hospitalization.  I discussed with Alex Mitchell treatment options/side effects, benefits of treatment and long-term outcomes. I discussed how HIV is transmitted and the process of untreated HIV including increased risk for opportunistic infections, cancer, dementia and renal failure. Patient was counseled on routine HIV care including medication adherence, blood monitoring, necessary vaccines and follow up visits. Counseled regarding safe sex practices including: condom use, partner disclosure, limiting partners. Patient spent time talking with our pharmacist Cassie regarding successful practices of ART and understands to reach out to our clinic in the future with questions.   Will continue Biktarvy for him. He has had emergency RW/UMAP approved already thanks to the effort of our financial coordinators. He has all medications for his HIV treatment, candidiasis, opportunistic prophylaxis and current PJP treatment. We discussed that he will need to re-apply every January and July.   General introduction to our clinic and integrated services. Introduced to [place] and [facility] today. Dental referral discussed today - he will need dental referral [facility] in the future when his CD4 is higher and VL is suppressed.   I spent greater than 60 minutes with the patient today. Greater than 75% of the time spent face-to-face counseling and coordination of care re: HIV and health maintenance.

## 2018-11-02 NOTE — Assessment & Plan Note (Signed)
Likely in the setting of high dose bactrim use - discussed today. I asked him to hold on his MVI until his labs are repeated - he requested over and over to not have venipuncture today being he was just discharged Wednesday. I reviewed and provided high potassium containing food hand out and asked him to please avoid for now. He has an appointment for hospital follow up with his PCP early next week - will ask her to please repeat a CMET at that time.

## 2018-11-02 NOTE — Assessment & Plan Note (Addendum)
Improving - he is no longer requiring supplemental oxygen and reports improved functional capacity. He has a few more days of 40 mg daily on his prednisone and then understands to finish with 20 mg daily to complete 21 days total; continue atovaquone BID x 21 days (through 11/13/18). He will need to resume bactrim 1 DS tab 3x weekly for prophylaxis after completion.

## 2018-11-02 NOTE — Assessment & Plan Note (Signed)
Exam is reassuring for muscular etiology and not DVT. Encouraged ongoing massage and physical therapy/ambulating in the home. Precautions discussed.

## 2018-11-02 NOTE — Assessment & Plan Note (Signed)
Symptoms resolved with treatment - will keep his course of treatment at 10 days considering his LFTs were a bit elevated.

## 2018-11-02 NOTE — Assessment & Plan Note (Signed)
He declined flu vaccine today - counseled on vaccines for [facility]. He will consider for the next visit. Will ask his primary team to offer. Discussed flu prevention.  He will when his CD4 count > 200 need prevnar, pneumovax, menveo, hep a and b series.

## 2018-11-05 ENCOUNTER — Ambulatory Visit (INDEPENDENT_AMBULATORY_CARE_PROVIDER_SITE_OTHER): Payer: Self-pay | Admitting: Family Medicine

## 2018-11-05 ENCOUNTER — Encounter: Payer: Self-pay | Admitting: Family Medicine

## 2018-11-05 VITALS — BP 132/78 | HR 96 | Temp 98.2°F | Ht 68.0 in | Wt 207.2 lb

## 2018-11-05 DIAGNOSIS — Z131 Encounter for screening for diabetes mellitus: Secondary | ICD-10-CM

## 2018-11-05 DIAGNOSIS — D649 Anemia, unspecified: Secondary | ICD-10-CM

## 2018-11-05 DIAGNOSIS — B20 Human immunodeficiency virus [HIV] disease: Secondary | ICD-10-CM

## 2018-11-05 DIAGNOSIS — Z7689 Persons encountering health services in other specified circumstances: Secondary | ICD-10-CM

## 2018-11-05 DIAGNOSIS — Z09 Encounter for follow-up examination after completed treatment for conditions other than malignant neoplasm: Secondary | ICD-10-CM

## 2018-11-05 LAB — POCT URINALYSIS DIP (MANUAL ENTRY)
Bilirubin, UA: NEGATIVE
Blood, UA: NEGATIVE
Glucose, UA: NEGATIVE mg/dL
Ketones, POC UA: NEGATIVE mg/dL
Nitrite, UA: NEGATIVE
Protein Ur, POC: NEGATIVE mg/dL
Spec Grav, UA: 1.01 (ref 1.010–1.025)
Urobilinogen, UA: 0.2 E.U./dL
pH, UA: 6.5 (ref 5.0–8.0)

## 2018-11-05 LAB — POCT GLYCOSYLATED HEMOGLOBIN (HGB A1C): Hemoglobin A1C: 5.4 % (ref 4.0–5.6)

## 2018-11-05 NOTE — Progress Notes (Signed)
Hospital Follow Up--Establish Care  Subjective:    Patient ID: Alex Mitchell, male    DOB: 29-Sep-1981, 37 y.o.   MRN: [id number]   Chief Complaint  Patient presents with  . Establish Care  . Hospitalization Follow-up   HPI  Mr. Scogin is a 37 year old male with a past medical history of AIDS. He is here today to establish care.   Current Status: Since his last office visit, he is doing well with no complaints. He continues to follow up with Infection Disease. His last appointment was last week. His next appointment will be on 11/24/2018. He is accompanied by a friend today.   He denies fevers, chills, fatigue, recent infections, weight loss, and night sweats. He has not had any headaches, visual changes, dizziness, and falls. No chest pain, heart palpitations, cough and shortness of breath reported. No reports of GI problems such as nausea, vomiting, diarrhea, and constipation. He has no reports of blood in stools, dysuria and hematuria. No depression or anxiety, and denies suicidal ideations, homicidal ideations, or auditory hallucinations. He denies pain today.   Review of Systems  Constitutional: Negative.   HENT: Negative.   Eyes: Negative.   Respiratory: Negative.   Cardiovascular: Negative.   Gastrointestinal: Negative.   Endocrine: Negative.   Genitourinary: Negative.   Musculoskeletal: Negative.   Skin: Negative.   Allergic/Immunologic: Negative.   Neurological: Negative.   Hematological: Negative.   Psychiatric/Behavioral: Negative.    Objective:   Physical Exam Vitals signs and nursing note reviewed.  Constitutional:      Appearance: Normal appearance. He is normal weight.  HENT:     Head: Normocephalic and atraumatic.     Right Ear: Tympanic membrane, ear canal and external ear normal.     Left Ear: Tympanic membrane, ear canal and external ear normal.     Nose: Nose normal.     Mouth/Throat:     Mouth: Mucous membranes are moist.     Pharynx: Oropharynx is  clear.  Eyes:     Extraocular Movements: Extraocular movements intact.     Conjunctiva/sclera: Conjunctivae normal.     Pupils: Pupils are equal, round, and reactive to light.  Neck:     Musculoskeletal: Normal range of motion and neck supple.  Cardiovascular:     Rate and Rhythm: Normal rate and regular rhythm.  Pulmonary:     Effort: Pulmonary effort is normal.     Breath sounds: Normal breath sounds.  Abdominal:     General: Abdomen is flat. Bowel sounds are normal.     Palpations: Abdomen is soft.  Musculoskeletal: Normal range of motion.  Skin:    General: Skin is warm and dry.  Neurological:     General: No focal deficit present.     Mental Status: He is alert and oriented to person, place, and time.  Psychiatric:        Mood and Affect: Mood normal.        Behavior: Behavior normal.        Thought Content: Thought content normal.        Judgment: Judgment normal.    Assessment & Plan:   1. Hospital discharge follow-up  2. Encounter to establish care  3. Screening for diabetes mellitus Hgb A1c is within normal range at 5.4 today. He will continue to decrease foods/beverages high in sugars and carbs and follow Heart Healthy or DASH diet. Increase physical activity to at least 30 minutes cardio exercise daily.  - POCT urinalysis  dipstick - POCT glycosylated hemoglobin (Hb A1C)  4. Anemia, unspecified type Hgb is decreases at 9.8. He will begin oral iron daily.   5. AIDS (acquired immune deficiency syndrome) (HCC) Continue to follow up with Infection Disease as needed. He will continue antiviral medications as prescribed.   6. Follow up He will follow up in 3 months.   No orders of the defined types were placed in this encounter.   Raliegh IpNatalie Susan Arana,  MSN, FNP-C Patient Care [facility] [address]  [place], [place] [id number] [phone]

## 2018-11-17 ENCOUNTER — Ambulatory Visit (INDEPENDENT_AMBULATORY_CARE_PROVIDER_SITE_OTHER): Payer: Self-pay | Admitting: Family Medicine

## 2018-11-17 ENCOUNTER — Encounter: Payer: Self-pay | Admitting: Family Medicine

## 2018-11-17 VITALS — BP 126/80 | HR 88 | Temp 99.6°F | Ht 68.0 in | Wt 204.0 lb

## 2018-11-17 DIAGNOSIS — B20 Human immunodeficiency virus [HIV] disease: Secondary | ICD-10-CM

## 2018-11-17 DIAGNOSIS — K1379 Other lesions of oral mucosa: Secondary | ICD-10-CM

## 2018-11-17 DIAGNOSIS — A510 Primary genital syphilis: Secondary | ICD-10-CM

## 2018-11-17 DIAGNOSIS — Z09 Encounter for follow-up examination after completed treatment for conditions other than malignant neoplasm: Secondary | ICD-10-CM

## 2018-11-17 MED ORDER — CIPROFLOXACIN HCL 500 MG PO TABS: 500.0000 mg | ORAL_TABLET | Freq: Two times a day (BID) | ORAL | 0 refills | Status: DC

## 2018-11-17 MED FILL — CIPROFLOXACIN HCL 500 MG TA: 500 | 10 days supply | Qty: 20 | Fill #0

## 2018-11-17 NOTE — Progress Notes (Signed)
Sick Visit  Subjective:    Patient ID: Alex Mitchell, male    DOB: [birth year], 37 y.o.   MRN: [id number]   Chief Complaint  Patient presents with  . Mouth Lesions    HPI  [name] is a 37 year old male with a past medical history of AIDS, and Chanchre.   Current Status: Since his last office visit, he has c/o mouth sores for a few weeks now. He has not been eating because of painful mouth sores. He denies fevers, chills, fatigue, recent infections, weight loss, and night sweats.No reports of GI problems such as nausea, vomiting, diarrhea, and constipation. He has no reports of blood in stools, dysuria and hematuria.  He has not had any headaches, visual changes, dizziness, and falls. No chest pain, heart palpitations, cough and shortness of breath reported.  No depression or anxiety reported. He denies pain today.   Review of Systems  Constitutional: Negative.   HENT: Positive for mouth sores (painful).   Eyes: Negative.   Respiratory: Negative.   Cardiovascular: Negative.   Gastrointestinal: Negative.   Endocrine: Negative.   Genitourinary: Negative.   Musculoskeletal: Negative.   Skin: Negative.   Allergic/Immunologic: Negative.   Neurological: Negative.   Hematological: Negative.   Psychiatric/Behavioral: Negative.    Objective:   Physical Exam Vitals signs and nursing note reviewed.  Constitutional:      Appearance: Normal appearance. He is normal weight.  HENT:     Head: Normocephalic and atraumatic.     Right Ear: Tympanic membrane, ear canal and external ear normal.     Left Ear: Tympanic membrane, ear canal and external ear normal.     Nose: Nose normal.     Mouth/Throat:     Mouth: Mucous membranes are moist.     Pharynx: Oropharynx is clear.      Comments: Mouth sores on right side of tongue Eyes:     Conjunctiva/sclera: Conjunctivae normal.     Pupils: Pupils are equal, round, and reactive to light.  Neck:     Musculoskeletal: Normal range of motion and  neck supple.  Cardiovascular:     Rate and Rhythm: Normal rate and regular rhythm.     Pulses: Normal pulses.     Heart sounds: Normal heart sounds.  Pulmonary:     Effort: Pulmonary effort is normal.     Breath sounds: Normal breath sounds.  Abdominal:     General: Bowel sounds are normal. There is distension.     Palpations: Abdomen is soft.  Neurological:     Mental Status: He is alert.    Assessment & Plan:   1. [place] - ciprofloxacin (CIPRO) 500 MG tablet; Take 1 tablet (500 mg total) by mouth 2 (two) times daily.  Dispense: 20 tablet; Refill: 0  2. Mouth sore We will initiate Cipro today.  - ciprofloxacin (CIPRO) 500 MG tablet; Take 1 tablet (500 mg total) by mouth 2 (two) times daily.  Dispense: 20 tablet; Refill: 0  3. AIDS (acquired immune deficiency syndrome) (HCC) Stable. He will continue to follow up with Infectious Disease.   4. Follow up He will keep previously scheduled follow up appointment.   Meds ordered this encounter  Medications  . ciprofloxacin (CIPRO) 500 MG tablet    Sig: Take 1 tablet (500 mg total) by mouth 2 (two) times daily.    Dispense:  20 tablet    Refill:  0    Raliegh IpNatalie Dala Breault,  MSN, FNP-C [facility] [address] [place], [place] [address] [phone]

## 2018-11-17 NOTE — Patient Instructions (Signed)
Ciprofloxacin tablets What is this medicine? CIPROFLOXACIN (sip roe FLOX a sin) is a quinolone antibiotic. It is used to treat certain kinds of bacterial infections. It will not work for colds, flu, or other viral infections. This medicine may be used for other purposes; ask your health care provider or pharmacist if you have questions. COMMON BRAND NAME(S): Cipro What should I tell my health care provider before I take this medicine? They need to know if you have any of these conditions: -bone problems -diabetes -heart disease -high blood pressure -history of irregular heartbeat -history of low levels of potassium in the blood -joint problems -kidney disease -liver disease -mental illness -myasthenia gravis -seizures -tendon problems -tingling of the fingers or toes, or other nerve disorder -an unusual or allergic reaction to ciprofloxacin, other antibiotics or medicines, foods, dyes, or preservatives -pregnant or trying to get pregnant -breast-feeding How should I use this medicine? Take this medicine by mouth with a full glass of water. Follow the directions on the prescription label. You can take it with or without food. If it upsets your stomach, take it with food. Take your medicine at regular intervals. Do not take your medicine more often than directed. Take all of your medicine as directed even if you think you are better. Do not skip doses or stop your medicine early. Avoid antacids, aluminum, calcium, iron, magnesium, and zinc products for 6 hours before and 2 hours after taking a dose of this medicine. A special MedGuide will be given to you by the pharmacist with each prescription and refill. Be sure to read this information carefully each time. Talk to your pediatrician regarding the use of this medicine in children. Special care may be needed. Overdosage: If you think you have taken too much of this medicine contact a poison control center or emergency room at once. NOTE:  This medicine is only for you. Do not share this medicine with others. What if I miss a dose? If you miss a dose, take it as soon as you can. If it is almost time for your next dose, take only that dose. Do not take double or extra doses. What may interact with this medicine? Do not take this medicine with any of the following medications: -cisapride -dofetilide -dronedarone -flibanserin -lomitapide -pimozide -thioridazine -tizanidine -ziprasidone This medicine may also interact with the following medications: -antacids -birth control pills -caffeine -certain medicines for diabetes, like glipizide, glyburide, or insulin -certain medicines that treat or prevent blood clots like warfarin -clozapine -cyclosporine -didanosine buffered tablets or powder -duloxetine -lanthanum carbonate -lidocaine -methotrexate -multivitamins -NSAIDS, medicines for pain and inflammation, like ibuprofen or naproxen -olanzapine -omeprazole -other medicines that prolong the QT interval (cause an abnormal heart rhythm) -phenytoin -probenecid -ropinirole -sevelamer -sildenafil -sucralfate -theophylline -zolpidem This list may not describe all possible interactions. Give your health care provider a list of all the medicines, herbs, non-prescription drugs, or dietary supplements you use. Also tell them if you smoke, drink alcohol, or use illegal drugs. Some items may interact with your medicine. What should I watch for while using this medicine? Tell your doctor or healthcare professional if your symptoms do not start to get better or if they get worse. Do not treat diarrhea with over the counter products. Contact your doctor if you have diarrhea that lasts more than 2 days or if it is severe and watery. Check with your doctor or health care professional if you get an attack of severe diarrhea, nausea and vomiting, or if you sweat   a lot. The loss of too much body fluid can make it dangerous for you to  take this medicine. This medicine may affect blood sugar levels. If you have diabetes, check with your doctor or health care professional before you change your diet or the dose of your diabetic medicine. You may get drowsy or dizzy. Do not drive, use machinery, or do anything that needs mental alertness until you know how this medicine affects you. Do not sit or stand up quickly, especially if you are an older patient. This reduces the risk of dizzy or fainting spells. This medicine can make you more sensitive to the sun. Keep out of the sun. If you cannot avoid being in the sun, wear protective clothing and use a sunscreen. Do not use sun lamps or tanning beds/booths. What side effects may I notice from receiving this medicine? Side effects that you should report to your doctor or health care professional as soon as possible: -allergic reactions like skin rash or hives, swelling of the face, lips, or tongue -anxious -bloody or watery diarrhea -confusion -depressed mood -fast, irregular heartbeat -fever -hallucination, loss of contact with reality -joint, muscle, or tendon pain or swelling -loss of memory -pain, tingling, numbness in the hands or feet -seizures -signs and symptoms of aortic dissection such as sudden chest, stomach, or back pain -signs and symptoms of high blood sugar such as dizziness; dry mouth; dry skin; fruity breath; nausea; stomach pain; increased hunger or thirst; increased urination -signs and symptoms of liver injury like dark yellow or brown urine; general ill feeling or flu-like symptoms; light-colored stools; loss of appetite; nausea; right upper belly pain; unusually weak or tired; yellowing of the eyes or skin -signs and symptoms of low blood sugar such as feeling anxious; confusion; dizziness; increased hunger; unusually weak or tired; sweating; shakiness; cold; irritable; headache; blurred vision; fast heartbeat; loss of consciousness; pale skin -suicidal  thoughts or other mood changes -sunburn -unusually weak or tired Side effects that usually do not require medical attention (report to your doctor or health care professional if they continue or are bothersome): -dry mouth -headache -nausea -trouble sleeping This list may not describe all possible side effects. Call your doctor for medical advice about side effects. You may report side effects to FDA at 1-800-FDA-1088. Where should I keep my medicine? Keep out of the reach of children. Store at room temperature below 30 degrees C (86 degrees F). Keep container tightly closed. Throw away any unused medicine after the expiration date. NOTE: This sheet is a summary. It may not cover all possible information. If you have questions about this medicine, talk to your doctor, pharmacist, or health care provider.  2019 Elsevier/Gold Standard (2018-06-25 15:54:38)  

## 2018-11-20 ENCOUNTER — Telehealth: Payer: Self-pay

## 2018-11-20 NOTE — Telephone Encounter (Signed)
He can stop the medication for now. He is on azithromycin weekly that can also help with chancres. Will have to follow up with natatlie on her return.

## 2018-11-20 NOTE — Telephone Encounter (Signed)
Patient states that he is having dizzy, sob, cough, chills from the Ciprofloxacin that he was put on 11/17/2018. Patient would like to know what he needs to do and if something else can be sent in for him. Patient is aware that Dorene Grebe is out and that we will send message to Debby Bud to evaluate.

## 2018-11-20 NOTE — Telephone Encounter (Signed)
Patient advise and will stop medication and wait to hear from [facility] when she returns to office.

## 2018-11-24 ENCOUNTER — Ambulatory Visit (HOSPITAL_COMMUNITY)
Admission: RE | Admit: 2018-11-24 | Discharge: 2018-11-24 | Disposition: A | Discharge status: Home / Self Care | Payer: Self-pay | Source: Ambulatory Visit | Attending: Infectious Diseases | Admitting: Infectious Diseases

## 2018-11-24 ENCOUNTER — Ambulatory Visit (INDEPENDENT_AMBULATORY_CARE_PROVIDER_SITE_OTHER): Payer: Self-pay | Admitting: Infectious Diseases

## 2018-11-24 ENCOUNTER — Encounter: Payer: Self-pay | Admitting: Infectious Diseases

## 2018-11-24 VITALS — BP 132/84 | HR 93 | Temp 98.9°F | Wt 202.0 lb

## 2018-11-24 DIAGNOSIS — R05 Cough: Secondary | ICD-10-CM | POA: Insufficient documentation

## 2018-11-24 DIAGNOSIS — B20 Human immunodeficiency virus [HIV] disease: Secondary | ICD-10-CM

## 2018-11-24 DIAGNOSIS — R059 Cough, unspecified: Secondary | ICD-10-CM

## 2018-11-24 DIAGNOSIS — Z23 Encounter for immunization: Secondary | ICD-10-CM

## 2018-11-24 DIAGNOSIS — K120 Recurrent oral aphthae: Secondary | ICD-10-CM

## 2018-11-24 MED ORDER — SULFAMETHOXAZOLE-TRIMETHOPRIM 800-160 MG PO TABS: 1.0000 | ORAL_TABLET | ORAL | 6 refills | Status: DC

## 2018-11-24 MED ORDER — AZITHROMYCIN 600 MG PO TABS: 1200.0000 mg | ORAL_TABLET | ORAL | 6 refills | Status: DC

## 2018-11-24 MED ORDER — BICTEGRAVIR-EMTRICITAB-TENOFOV 50-200-25 MG PO TABS: 1.0000 | ORAL_TABLET | Freq: Every day | ORAL | 6 refills | Status: DC

## 2018-11-24 MED FILL — FERROUS SULFATE 325 MG TAB: 325 (65 FE) | 30 days supply | Qty: 15 | Fill #1

## 2018-11-24 NOTE — Patient Instructions (Addendum)
For your chest x-ray - please go to [facility] (1st floor of the hospital - can park in valet parking on the 2nd floor and walk down).   Will have you drop off a urine sample and lab work today to repeat your labs today  Mucinex-D is helpful - this has sudafed in it for decongestant.   Please come back in 3 weeks to make sure you are feeling better.    General Recommendations:    Please drink plenty of fluids.  Get plenty of rest   Sleep in humidified air  Use saline nasal sprays  Netti pot   OTC Medications:  Decongestants - helps relieve congestion   Flonase (generic fluticasone) or Nasacort (generic triamcinolone) - please make sure to use the "cross-over" technique at a 45 degree angle towards the opposite eye as opposed to straight up the nasal passageway.   Sudafed (generic pseudoephedrine - Note this is the one that is available behind the pharmacy counter); Products with phenylephrine (-PE) may also be used but is often not as effective as pseudoephedrine.   If you have HIGH BLOOD PRESSURE - Coricidin HBP; AVOID any product that is -D as this contains pseudoephedrine which may increase your blood pressure.  Afrin (oxymetazoline) every 6-8 hours for up to 3 days.   Allergies - helps relieve runny nose, itchy eyes and sneezing   Claritin (generic loratidine), Allegra (fexofenidine), or Zyrtec (generic cyrterizine) for runny nose. These medications should not cause drowsiness.  Note - Benadryl (generic diphenhydramine) may be used however may cause drowsiness  Cough -   Delsym or Robitussin (generic dextromethorphan)  Expectorants - helps loosen mucus to ease removal   Mucinex (generic guaifenesin) as directed on the package.  Headaches / General Aches   Tylenol (generic acetaminophen) - DO NOT EXCEED 3 grams (3,000 mg) in a 24 hour time period  Advil/Motrin (generic ibuprofen)   Sore Throat -   Salt water gargle   Chloraseptic (generic  benzocaine) spray or lozenges / Sucrets (generic dyclonine)

## 2018-11-24 NOTE — Assessment & Plan Note (Signed)
Completed course of treatment for PJP - he is non-toxic, non hypoxic today and no cough on exam but crackles on lung assessment bilaterally.He has nearly occluded left nare with ulceration. Likely he has respiratory virus. Will treat with Mucin ex-D, Flonase, rest and fluids. No facial pressure or ear pain. He may have a secondary bacterial pneumonia; will check urine strep pneumo antigen today in addition to BNP and CXR with complaint of sudden lower extremity swelling otherwise unexplained. Fortunately he is improving and afebrile without intervention (did get 2 days of ciprofloxacin). Further treatment pending studies.

## 2018-11-24 NOTE — Assessment & Plan Note (Signed)
Explained that this is most certainly related to his underlying immune dysfunction. He may experience more of these while his immune system re-builds. May consider repeating course of prednisone if severe pain related to this +/- topical therapy with magic mouth wash. With risks associated with flouroquinolones would not use this as first line for treatment.

## 2018-11-24 NOTE — Assessment & Plan Note (Addendum)
He seems to be tolerating biktarvy very well with no side effects. Repeat VL and CD4 today to follow. Will refill his bactrim 1 DS tab 3x weekly and azithromycin 1200 mg once weekly for OI proph. Biktarvy refills also sent to [facility] on [address] for UMAP processing. Flu shot given today.  He met with Kathy today for re-enrollment and counseled on interval for this.  He will follow up here in 3 weeks to follow closely.  

## 2018-11-24 NOTE — Progress Notes (Signed)
Name: Alex Mitchell  DOB: 1981-07-14 MRN: [id number] PCP: Azzie Glatter, FNP   Patient Active Problem List   Diagnosis Date Noted  . Aphthous ulcer of mouth 11/24/2018  . Cough 11/24/2018  . Hyperkalemia 11/02/2018  . Calf tenderness 11/02/2018  . Healthcare maintenance 10/31/2018  . 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 AIDS (acquired immune deficiency syndrome) ([place]) 10/31/2018  . Leukopenia 10/21/2018  . Anemia 10/21/2018  . Acute respiratory failure with hypoxia ([place]) 10/21/2018  . Proteinuria 10/21/2018  . Elevated LFTs 10/21/2018     Brief Narrative:  Alex Mitchell is a 38 y.o. male with HIV infection (+) AIDS with CD4 6, VL 770,000 copies; dx during hospitalization 09-2018 with PJP pneumonia. HIV Risk: MSM. History of OIs: PJP pna, esophageal candidiasis   Previous Regimens: . Biktarvy 09-2018  Genotypes: . Not performed   Subjective:  CC:  Feeling poorly. Has some cough, shortness of breath and   HPI:  Over the last 10-14 days has developed oral lesions, nasal congestion with rhinorrhea and post nasal drip, cough and choking. He went to PCP for evaluation and was given ciprofloxacin for oral lesions 500 mg BID. He called on 11/20/18 after taking this and reported dizziness, shortness of breath, cough, chills/fever and swelling of legs. He was advised to stop the medication. Since then his fevers/chills have resolved however he still has some lower extremity edema L>R. Has been using mucinex for cough which has helped. He still has significant nasal congestion on the left nare; had a nosebleed as well recently.   Review of Systems  Constitutional: Positive for chills and fever. Negative for malaise/fatigue.  HENT: Positive for congestion. Negative for ear pain, sore throat and tinnitus.   Respiratory: Positive for cough and shortness of breath. Negative for sputum production and wheezing.   Cardiovascular: Positive for leg swelling. Negative  for chest pain and orthopnea.  Gastrointestinal: Negative for abdominal pain, diarrhea, nausea and vomiting.  Genitourinary: Negative for dysuria.  Musculoskeletal: Negative for myalgias.  Skin: Negative for rash.  Neurological: Positive for dizziness. Negative for headaches.    Outpatient Medications Prior to Visit  Medication Sig Dispense Refill  . ferrous sulfate 325 (65 FE) MG tablet Take 1 tablet (325 mg total) by mouth every other day. 15 tablet 3  . bictegravir-emtricitabine-tenofovir AF (BIKTARVY) 50-200-25 MG TABS tablet Take 1 tablet by mouth daily. 30 tablet 1  . azithromycin (ZITHROMAX) 600 MG tablet Take 2 tablets (1,200 mg total) by mouth once a week. On Mondays. 8 tablet 0  . ciprofloxacin (CIPRO) 500 MG tablet Take 1 tablet (500 mg total) by mouth 2 (two) times daily. (Patient not taking: Reported on 11/24/2018) 20 tablet 0  . fluconazole (DIFLUCAN) 200 MG tablet Take 2 tablets (400 mg total) by mouth daily. (Patient not taking: Reported on 11/17/2018) 18 tablet 0  . predniSONE (DELTASONE) 20 MG tablet Take 40 mg (2 tabs) for 3 days then 20 mg (1 tab) for 11 days (Patient not taking: Reported on 11/17/2018) 17 tablet 0   No facility-administered medications prior to visit.      No Known Allergies  Social History   Tobacco Use  . Smoking status: Never Smoker  . Smokeless tobacco: Never Used  Substance Use Topics  . Alcohol use: Not Currently  . Drug use: No    Social History   Substance and Sexual Activity  Sexual Activity Not on file     Objective:   Vitals:   11/24/18  1121  BP: 132/84  Pulse: 93  Temp: 98.9 F (37.2 C)  TempSrc: Oral  Weight: 202 lb (91.6 kg)  Pulse Ox on room air. Repeated heart rate low 100s after sitting.   Body mass index is 30.71 kg/m.  Physical Exam Vitals signs and nursing note reviewed.  Constitutional:      General: He is not in acute distress.    Appearance: Normal appearance. He is not ill-appearing or  toxic-appearing.  HENT:     Right Ear: Tympanic membrane and ear canal normal.     Left Ear: Tympanic membrane and ear canal normal.     Nose: Mucosal edema, congestion and rhinorrhea present.     Left Nostril: Occlusion (nearly ) present.     Right Turbinates: Swollen.     Left Turbinates: Swollen.     Right Sinus: No frontal sinus tenderness.     Left Sinus: No frontal sinus tenderness.  Eyes:     Conjunctiva/sclera: Conjunctivae normal.     Right eye: Right conjunctiva is not injected.     Left eye: Left conjunctiva is not injected.     Pupils: Pupils are equal, round, and reactive to light.  Cardiovascular:     Rate and Rhythm: Normal rate and regular rhythm.     Pulses: Normal pulses.     Heart sounds: No murmur.  Pulmonary:     Effort: No respiratory distress.     Breath sounds: No stridor. Wheezing and rales (bilateral up to mid posterior chest ) present. No rhonchi.     Comments: Shallow inspiratory effort. No cough demonstrated.  Abdominal:     General: Abdomen is flat.     Palpations: Abdomen is soft.  Musculoskeletal:        General: Swelling (2+ edema b/l ankles to mid shin) present.  Skin:    General: Skin is warm and dry.     Capillary Refill: Capillary refill takes less than 2 seconds.  Neurological:     Mental Status: He is alert.    Lab Results Lab Results  Component Value Date   WBC 6.3 10/29/2018   HGB 9.8 (L) 10/29/2018   HCT 31.2 (L) 10/29/2018   MCV 87.4 10/29/2018   PLT 644 (H) 10/29/2018    Lab Results  Component Value Date   CREATININE 1.08 10/29/2018   BUN 24 (H) 10/29/2018   NA 131 (L) 10/29/2018   K 5.5 (H) 10/29/2018   CL 98 10/29/2018   CO2 22 10/29/2018    Lab Results  Component Value Date   ALT 83 (H) 10/21/2018   AST 93 (H) 10/21/2018   ALKPHOS 132 (H) 10/21/2018   BILITOT 0.5 10/21/2018    No results found for: CHOL, HDL, LDLCALC, LDLDIRECT, TRIG, CHOLHDL HIV 1 RNA Quant (copies/mL)  Date Value  10/21/2018 770,000    CD4 T Cell Abs (/uL)  Date Value  10/24/2018 6 (L)    Assessment & Plan:   Problem List Items Addressed This Visit      Unprioritized   AIDS (acquired immune deficiency syndrome) ([place]) - Primary    He seems to be tolerating biktarvy very well with no side effects. Repeat VL and CD4 today to follow. Will refill his bactrim 1 DS tab 3x weekly and azithromycin 1200 mg once weekly for OI proph. Biktarvy refills also sent to [facility] on [address] for [employer]. Flu shot given today.  He met with Juliann Pulse today for re-enrollment and counseled on interval for this.  He  will follow up here in 3 weeks to follow closely.       Relevant Medications   sulfamethoxazole-trimethoprim (BACTRIM DS,SEPTRA DS) 800-160 MG tablet   azithromycin (ZITHROMAX) 600 MG tablet   bictegravir-emtricitabine-tenofovir AF (BIKTARVY) 50-200-25 MG TABS tablet   Other Relevant Orders   HIV-1 RNA quant-no reflex-bld   COMPLETE METABOLIC PANEL WITH GFR   T-helper cell (CD4)- ([facility] clinic only)   DG Chest 2 View   Strep pneumoniae urinary antigen   Aphthous ulcer of mouth    Explained that this is most certainly related to his underlying immune dysfunction. He may experience more of these while his immune system re-builds. May consider repeating course of prednisone if severe pain related to this +/- topical therapy with magic mouth wash. With risks associated with flouroquinolones would not use this as first line for treatment.       Cough    Completed course of treatment for PJP - he is non-toxic, non hypoxic today and no cough on exam but crackles on lung assessment bilaterally.He has nearly occluded left nare with ulceration. Likely he has respiratory virus. Will treat with Mucin ex-D, Flonase, rest and fluids. No facial pressure or ear pain. He may have a secondary bacterial pneumonia; will check urine strep pneumo antigen today in addition to BNP and CXR with complaint of sudden lower extremity swelling  otherwise unexplained. Fortunately he is improving and afebrile without intervention (did get 2 days of ciprofloxacin). Further treatment pending studies.       Relevant Orders   B Nat Peptide   DG Chest 2 View   Strep pneumoniae urinary antigen    Other Visit Diagnoses    Need for immunization against influenza       Relevant Orders   Flu Vaccine QUAD 36+ mos IM (Completed)      Janene Madeira, MSN, NP-C [facility] [place] Pager: [phone] Office: [phone]

## 2018-11-25 LAB — T-HELPER CELL (CD4) - (RCID CLINIC ONLY)
CD4 % Helper T Cell: 3 % — ABNORMAL LOW (ref 33–55)
CD4 T Cell Abs: 30 /uL — ABNORMAL LOW (ref 400–2700)

## 2018-11-26 LAB — COMPLETE METABOLIC PANEL WITH GFR
AG Ratio: 1.2 (calc) (ref 1.0–2.5)
ALT: 20 U/L (ref 9–46)
AST: 24 U/L (ref 10–40)
Albumin: 3.6 g/dL (ref 3.6–5.1)
Alkaline phosphatase (APISO): 143 U/L — ABNORMAL HIGH (ref 40–115)
BUN/Creatinine Ratio: 4 (calc) — ABNORMAL LOW (ref 6–22)
BUN: 5 mg/dL — ABNORMAL LOW (ref 7–25)
CO2: 25 mmol/L (ref 20–32)
Calcium: 9.7 mg/dL (ref 8.6–10.3)
Chloride: 103 mmol/L (ref 98–110)
Creat: 1.13 mg/dL (ref 0.60–1.35)
GFR, Est African American: 96 mL/min/{1.73_m2} (ref >=60)
GFR, Est Non African American: 83 mL/min/{1.73_m2} (ref >=60)
Globulin: 3 g/dL (calc) (ref 1.9–3.7)
Glucose, Bld: 89 mg/dL (ref 65–99)
POTASSIUM: 4.2 mmol/L (ref 3.5–5.3)
Sodium: 138 mmol/L (ref 135–146)
Total Bilirubin: 0.3 mg/dL (ref 0.2–1.2)
Total Protein: 6.6 g/dL (ref 6.1–8.1)

## 2018-11-26 LAB — HIV-1 RNA QUANT-NO REFLEX-BLD
HIV 1 RNA Quant: 197 copies/mL — ABNORMAL HIGH
HIV-1 RNA Quant, Log: 2.29 Log copies/mL — ABNORMAL HIGH

## 2018-11-26 LAB — STREP PNEUMONIAE URINARY ANTIGEN: Strep Pneumo Urinary Antigen: NOT DETECTED

## 2018-11-26 LAB — BRAIN NATRIURETIC PEPTIDE: Brain Natriuretic Peptide: 33 pg/mL (ref <100)

## 2018-12-19 ENCOUNTER — Other Ambulatory Visit: Payer: Self-pay | Admitting: Infectious Diseases

## 2018-12-19 DIAGNOSIS — B20 Human immunodeficiency virus [HIV] disease: Secondary | ICD-10-CM

## 2018-12-23 ENCOUNTER — Ambulatory Visit (INDEPENDENT_AMBULATORY_CARE_PROVIDER_SITE_OTHER): Payer: Self-pay | Admitting: Infectious Diseases

## 2018-12-23 ENCOUNTER — Encounter: Payer: Self-pay | Admitting: Infectious Diseases

## 2018-12-23 VITALS — BP 128/77 | HR 80 | Temp 97.8°F | Ht 68.0 in | Wt 216.0 lb

## 2018-12-23 DIAGNOSIS — Z0000 Encounter for general adult medical examination without abnormal findings: Secondary | ICD-10-CM

## 2018-12-23 DIAGNOSIS — B20 Human immunodeficiency virus [HIV] disease: Secondary | ICD-10-CM

## 2018-12-23 DIAGNOSIS — H9192 Unspecified hearing loss, left ear: Secondary | ICD-10-CM | POA: Insufficient documentation

## 2018-12-23 DIAGNOSIS — R059 Cough, unspecified: Secondary | ICD-10-CM

## 2018-12-23 DIAGNOSIS — R05 Cough: Secondary | ICD-10-CM

## 2018-12-23 MED ORDER — PREDNISONE 10 MG (21) PO TBPK: ORAL_TABLET | ORAL | 0 refills | Status: DC

## 2018-12-23 MED ORDER — SULFAMETHOXAZOLE-TRIMETHOPRIM 800-160 MG PO TABS: 1.0000 | ORAL_TABLET | Freq: Every day | ORAL | 5 refills | Status: DC

## 2018-12-23 NOTE — Assessment & Plan Note (Signed)
Nice reduction in viral load 4 months ago with low CD4 @ 30 cells. Will have him continue Biktarvy once a day with weekly azithromycin, bactrim 1 DS tab daily. Will plan to repeat labs in 6 weeks at follow up visit. Still with ongoing cough following hospitalization for presumed pneumocystis pneumonia. Work up described below.

## 2018-12-23 NOTE — Assessment & Plan Note (Addendum)
Has been going on intermittently for a period of many months even prior to hospitalization. 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 Fluid has a yellow appearance. He has associated nasal congestion and reports a period of time while on antibiotics/prednisone that the ears "popped" and improved. He has no vestibular dysfunction associated with this. No concern on exam for purulent/bacterial process as TM does not appear injected/bulging. Will treat with short term steroid taper to see if this helps decongest him as this sounds to be worsening.  Continue flonase and consider antihistamine.  Referral to ENT considering associated hearing loss.

## 2018-12-23 NOTE — Assessment & Plan Note (Signed)
Defer further recommended vaccines until CD4 reconstitutes > 200 for better immune response.

## 2018-12-23 NOTE — Assessment & Plan Note (Signed)
He has wheezing bilaterally but no crackles or rhonchi. He is not hypoxic and activity tolerance/functional capacity has improved.  His chest Xray reveals improvement and resolution of previous patchy opacities with treatment for presumed PJP.  Will increase bactrim back to 1 DS tab QD to see if this offers any benefit.  Advised to trial Ventolin inhaler to see if it helps wheezing.  If still ongoing at appt in 6 weeks will consider CT scan of chest to better assess and repeat sputum culture, AFB smear + PJP stain.

## 2018-12-23 NOTE — Progress Notes (Signed)
Name: Alex Mitchell  DOB: 25-May-1981 MRN: [id number] PCP: Kallie Locks, FNP    Patient Active Problem List   Diagnosis Date Noted  . Decreased hearing of left ear 12/23/2018  . Cough 11/24/2018  . Healthcare maintenance 10/31/2018  . 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 AIDS (acquired immune deficiency syndrome) ([facility]) 10/31/2018  . Leukopenia 10/21/2018  . Anemia 10/21/2018  . Acute respiratory failure with hypoxia ([facility]) 10/21/2018  . Proteinuria 10/21/2018  . Elevated LFTs 10/21/2018     Brief Narrative:  Alex Mitchell is a 38 y.o. male with HIV infection (+) AIDS with CD4 6, VL 770,000 copies; dx during hospitalization 09-2018 with PJP pneumonia. HIV Risk: MSM. History of OIs: PJP pna, esophageal candidiasis   Previous Regimens: . Biktarvy 09-2018   Genotypes: . Not performed in hospital   Subjective:  CC:  Follow up on HIV/AIDS and acute illness 3 weeks ago. Cough with wheezing, pressure/muffled left hearing with "feeling of movement" behind eardrum.   HPI:  Feeling better compared to 3 weeks ago. He still notices wheezing and productive cough effort with greenish sputum but no shortness of breath; has increased exercise and started walking (even running a little) recently. He was impressed at how he was not limited by SOB. He has ventolin inhaler at home but has not used this yet. Denies any fevers/chills/nightsweats. He continues on his bactrim 3x/week, azithromycin weekly and biktarvy every day. He still takes mucinex-d and flonase spray for nasal congestion. Before his hospitalization he has noticed decreased / muffled hearing L>R. He reports that he feels like something is behind his ear and can her crackles/fluid move when he turns his head. No associated dizziness or balance problems. During the course of treatment with antibiotics + prednisone he had several moments where his ears popped and he was able to hear sound normally.   Review of  Systems  Constitutional: Negative for chills, fever, malaise/fatigue and weight loss.  HENT: Positive for congestion and hearing loss. Negative for ear pain and sore throat.   Eyes: Negative for redness.  Respiratory: Positive for cough, sputum production and wheezing. Negative for shortness of breath.   Cardiovascular: Negative for chest pain and leg swelling.  Gastrointestinal: Negative for abdominal pain, diarrhea and vomiting.  Genitourinary: Negative for dysuria.  Musculoskeletal: Negative for joint pain, myalgias and neck pain.  Skin: Negative for rash.  Neurological: Negative for headaches.  Psychiatric/Behavioral: Negative for depression and substance abuse. The patient is not nervous/anxious.     Outpatient Medications Prior to Visit  Medication Sig Dispense Refill  . azithromycin (ZITHROMAX) 600 MG tablet Take 2 tablets (1,200 mg total) by mouth once a week. 8 tablet 6  . bictegravir-emtricitabine-tenofovir AF (BIKTARVY) 50-200-25 MG TABS tablet Take 1 tablet by mouth daily. 30 tablet 6  . ferrous sulfate 325 (65 FE) MG tablet Take 1 tablet (325 mg total) by mouth every other day. 15 tablet 3  . sulfamethoxazole-trimethoprim (BACTRIM DS,SEPTRA DS) 800-160 MG tablet Take 1 tablet by mouth 3 (three) times a week. 12 tablet 6   No facility-administered medications prior to visit.      No Known Allergies  Social History   Tobacco Use  . Smoking status: Never Smoker  . Smokeless tobacco: Never Used  Substance Use Topics  . Alcohol use: Not Currently  . Drug use: No    Social History   Substance and Sexual Activity  Sexual Activity Not on file     Objective:  Vitals:   12/23/18 0842  BP: 128/77  Pulse: 80  Temp: 97.8 F (36.6 C)  TempSrc: Oral  Weight: 216 lb (98 kg)  Height: 5\' 8"  (1.727 m)  Pulse Ox on room air. Repeated heart rate low 100s after sitting.   Body mass index is 32.84 kg/m.  Physical Exam Constitutional:      Appearance: Normal  appearance. He is well-developed.     Comments: Seated comfortably in chair during visit.   HENT:     Right Ear: External ear normal. Decreased hearing noted. No drainage. No middle ear effusion. No mastoid tenderness. Tympanic membrane is not injected, retracted or bulging.     Left Ear: External ear normal. Decreased hearing noted. No drainage. A middle ear effusion is present. No mastoid tenderness. Tympanic membrane is not injected, erythematous, retracted or bulging.     Nose: No mucosal edema or rhinorrhea.     Mouth/Throat:     Dentition: Normal dentition. No dental abscesses.  Neck:     Musculoskeletal: Full passive range of motion without pain.  Cardiovascular:     Rate and Rhythm: Normal rate and regular rhythm.     Heart sounds: Normal heart sounds.  Pulmonary:     Effort: Pulmonary effort is normal. No tachypnea or respiratory distress.     Breath sounds: No decreased air movement. Wheezing (diffuse throughout lungs bilaterally ) present.  Abdominal:     General: There is no distension.     Palpations: Abdomen is soft.     Tenderness: There is no abdominal tenderness.  Lymphadenopathy:     Cervical: Cervical adenopathy present.     Right cervical: Posterior cervical adenopathy present.  Skin:    General: Skin is warm and dry.     Findings: No rash.  Neurological:     Mental Status: He is alert and oriented to person, place, and time.  Psychiatric:        Judgment: Judgment normal.     Comments: In good spirits today and engaged in care discussion.     Lab Results Lab Results  Component Value Date   WBC 6.3 10/29/2018   HGB 9.8 (L) 10/29/2018   HCT 31.2 (L) 10/29/2018   MCV 87.4 10/29/2018   PLT 644 (H) 10/29/2018    Lab Results  Component Value Date   CREATININE 1.13 11/24/2018   BUN 5 (L) 11/24/2018   NA 138 11/24/2018   K 4.2 11/24/2018   CL 103 11/24/2018   CO2 25 11/24/2018    Lab Results  Component Value Date   ALT 20 11/24/2018   AST 24  11/24/2018   ALKPHOS 132 (H) 10/21/2018   BILITOT 0.3 11/24/2018    No results found for: CHOL, HDL, LDLCALC, LDLDIRECT, TRIG, CHOLHDL HIV 1 RNA Quant (copies/mL)  Date Value  11/24/2018 197 (H)  10/21/2018 770,000   CD4 T Cell Abs (/uL)  Date Value  11/24/2018 30 (L)  10/24/2018 6 (L)    Assessment & Plan:   Problem List Items Addressed This Visit      Unprioritized   Healthcare maintenance    Defer further recommended vaccines until CD4 reconstitutes > 200 for better immune response.       AIDS (acquired immune deficiency syndrome) ([facility]) - Primary    Nice reduction in viral load 4 months ago with low CD4 @ 30 cells. Will have him continue Biktarvy once a day with weekly azithromycin, bactrim 1 DS tab daily. Will plan to repeat labs in  6 weeks at follow up visit. Still with ongoing cough following hospitalization for presumed pneumocystis pneumonia. Work up described below.       Relevant Medications   sulfamethoxazole-trimethoprim (BACTRIM DS,SEPTRA DS) 800-160 MG tablet   Cough    He has wheezing bilaterally but no crackles or rhonchi. He is not hypoxic and activity tolerance/functional capacity has improved.  His chest Xray reveals improvement and resolution of previous patchy opacities with treatment for presumed PJP.  Will increase bactrim back to 1 DS tab QD to see if this offers any benefit.  Advised to trial Ventolin inhaler to see if it helps wheezing.  If still ongoing at appt in 6 weeks will consider CT scan of chest to better assess and repeat sputum culture, AFB smear + PJP stain.       Decreased hearing of left ear    Has been going on intermittently for a period of many months even prior to hospitalization. 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 Fluid has a yellow appearance. He has associated nasal congestion and reports a period of time while on antibiotics/prednisone that the ears "popped" and improved. He has no vestibular  dysfunction associated with this. No concern on exam for purulent/bacterial process as TM does not appear injected/bulging. Will treat with short term steroid taper to see if this helps decongest him as this sounds to be worsening.  Continue flonase and consider antihistamine.  Referral to ENT considering associated hearing loss.       Relevant Orders   Ambulatory referral to ENT      Rexene AlbertsStephanie Anelisse Jacobson, MSN, NP-C [facility] Pager: [phone] Office: [phone]

## 2018-12-23 NOTE — Patient Instructions (Addendum)
For your hearing - try a short round of prednisone to see if this helps you. 6 tablets on day 1 with food, 5 the next day, then 4, then 3, then 2, then 1 then stop.   For your cough and wheezing please use the Ventolin inhaler you have from the Urgent Care Visit to see if this helps. Can cause some heart racing feelings so start with 1 puff every 6 hours to see how you do. Can use every 4 hours.   Continue your mucinex-D.   Will send in a referral to ENT to help evaluate your muffled hearing.   Please return in 6 weeks - will plan labs at this visit.

## 2018-12-30 ENCOUNTER — Telehealth: Payer: Self-pay

## 2018-12-30 NOTE — Telephone Encounter (Signed)
-----   Message from Blanchard Kelch, NP sent at 12/29/2018  7:49 PM EST ----- Regarding: Check in #2 Would you also give Alex Mitchell a call to see how he is feeling since giving him another round of steroids and using inhaler. He was having some decreased hearing in left ear and wheezing in chest with productive cough.  He was pretty sick a few months ago with pneumocystis pneumonia so want to be careful with him.   Thank you!

## 2018-12-30 NOTE — Telephone Encounter (Signed)
Per Rexene Alberts, Np called patient to see how he is feeling. Unable to reach patient at this time. Left voicemail asking patient to return call when available. Lorenso Courier, [place]

## 2019-01-27 ENCOUNTER — Other Ambulatory Visit: Payer: Self-pay

## 2019-01-27 ENCOUNTER — Encounter: Payer: Self-pay | Admitting: Infectious Diseases

## 2019-01-27 ENCOUNTER — Ambulatory Visit (INDEPENDENT_AMBULATORY_CARE_PROVIDER_SITE_OTHER): Payer: Self-pay | Admitting: Infectious Diseases

## 2019-01-27 VITALS — BP 128/80 | HR 78 | Temp 98.0°F | Ht 68.0 in | Wt 239.0 lb

## 2019-01-27 DIAGNOSIS — J302 Other seasonal allergic rhinitis: Secondary | ICD-10-CM

## 2019-01-27 DIAGNOSIS — B20 Human immunodeficiency virus [HIV] disease: Secondary | ICD-10-CM

## 2019-01-27 DIAGNOSIS — L853 Xerosis cutis: Secondary | ICD-10-CM

## 2019-01-27 DIAGNOSIS — Z0000 Encounter for general adult medical examination without abnormal findings: Secondary | ICD-10-CM

## 2019-01-27 DIAGNOSIS — J309 Allergic rhinitis, unspecified: Secondary | ICD-10-CM | POA: Insufficient documentation

## 2019-01-27 DIAGNOSIS — B59 Pneumocystosis: Secondary | ICD-10-CM

## 2019-01-27 NOTE — Assessment & Plan Note (Signed)
Prevnar and pneumovax when CD4 reconstitutes. He received flu shot this year.

## 2019-01-27 NOTE — Assessment & Plan Note (Signed)
Since repeating his prednisone and continued on biktarvy he has improved significantly. His lung assessment is completely normal now.

## 2019-01-27 NOTE — Assessment & Plan Note (Signed)
He has some mild dry skin/dermatitis over the face. No rash present and low suspicion for s/e to bactrim. I suspect this is due more to frequent exfoliation and cleansing with soaps. Encouraged to discuss with his provider about sensitive skin/hydrating options to help. Also recommend humidifier in room to help moisten the air. This will also help with nosebleeds.  Continue bactrim as currently taking.

## 2019-01-27 NOTE — Patient Instructions (Addendum)
For your sneezing / allergy symptoms:  1. Zyrtec or Zyrtec-D ( the "D" is for decongestant and will help with stuffy nose) 2. Nasal sprays (Flonase or Nasocort)  3. Sleep with a humidifier in your room.  4. Nasal irrigation (Ocean spray)   You look like you are feeling so much better.   The weight gain is likely due to a combination of things - eating better, feeling better, body processing foods more efficiently now that HIV is under better control. Pay close attention to your meals and try to reduce your portion sizes and carbohydrate if your weight gain bothers you.  Will continue Bactrim every other day and Biktarvy every day.   Please come back in 3 months

## 2019-01-27 NOTE — Assessment & Plan Note (Signed)
Symptoms appear c/w allergic rhinosinusitis. Discussed starting zyrtec-d, frequent nasal saline irrigations, flonase if he experiences more refractory congestion.  Likely some mucosal inflammation driving nosebleeds - check CBC today to ensure platelets are normal. Humidifier in the bedroom.

## 2019-01-27 NOTE — Assessment & Plan Note (Signed)
We reviewed labs today. Will repeat his VL/CD4 today. Discussed recommended vaccines - will wait on prevnar/pneumovax series until CD4 > 200 to ensure good immunologic response.  He will continue on bactrim 3x/week and biktarvy daily as he is currently taking.  Discussed again bactrim being short term medication and I hope we can stop this in 3- months time.  Return in about 3 months (around 04/29/2019).

## 2019-01-27 NOTE — Progress Notes (Signed)
Name: Alex Mitchell  DOB: February 06, 1981 MRN: [id number] PCP: Kallie Locks, FNP    Patient Active Problem List   Diagnosis Date Noted  . Dry skin 01/27/2019  . 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 Healthcare maintenance 10/31/2018  . AIDS (acquired immune deficiency syndrome) (HCC) 10/31/2018  . Leukopenia 10/21/2018  . Anemia 10/21/2018  . Proteinuria 10/21/2018  . Elevated LFTs 10/21/2018     Brief Narrative:  Alex Mitchell is a 38 y.o. male with HIV infection (+) AIDS with CD4 6, VL 770,000 copies; dx during hospitalization 09-2018 with PJP pneumonia. HIV Risk: MSM. History of OIs: PJP pna, esophageal candidiasis   Previous Regimens: . Biktarvy 09-2018   Genotypes: . Not performed in hospital   Subjective:  CC:  Follow up on HIV/AIDS. Feeling much better. Cough is gone. Has had some nosebleeds.   HPI:  Alex Mitchell is feeling much improved since the last round of steroids. His ear fullness and cough have cleared up with repeating prednisone taper and frequent decongestants. He has not needed his inhalers at all in the last few weeks. Continues to take his Biktarvy daily and Bactrim 3x/week now. He reports 100% compliance with the regimen.   Over the last 2 weeks he has noticed some worsening of dry skin on face and mild itching over chest. He wonders if this is due to the bactrim. He has started getting monthly facials and using exfoliator twice a week as well as new shea butter natural soap. He has had some nose bleeds, 2 of which were severe where it got all over his shirt and took a long time to stop. He has had some associated sneezing fits and intermittent stuffy nose. No itchy eyes, shortness of breath, productive cough, chest pain.   He has noticed some weight gain that appears to be more rapid lately 37 lbs since January of this year. Looking to start some diet/exercise changes.    Review of Systems  Constitutional: Negative for chills, fever, malaise/fatigue and  weight loss.  HENT: Positive for nosebleeds. Negative for ear pain, hearing loss, sinus pain and sore throat.        No dental problems.   Eyes: Negative for blurred vision.  Respiratory: Negative for cough and sputum production.   Cardiovascular: Negative for chest pain and leg swelling.  Gastrointestinal: Negative for abdominal pain, diarrhea and vomiting.  Genitourinary: Negative for dysuria and flank pain.  Musculoskeletal: Negative for joint pain, myalgias and neck pain.  Skin: Positive for itching. Negative for rash.       Dry skin on face  Neurological: Negative for dizziness, tingling and headaches.  Psychiatric/Behavioral: Negative for depression and substance abuse. The patient is not nervous/anxious and does not have insomnia.     Outpatient Medications Prior to Visit  Medication Sig Dispense Refill  . azithromycin (ZITHROMAX) 600 MG tablet Take 2 tablets (1,200 mg total) by mouth once a week. 8 tablet 6  . bictegravir-emtricitabine-tenofovir AF (BIKTARVY) 50-200-25 MG TABS tablet Take 1 tablet by mouth daily. 30 tablet 6  . ferrous sulfate 325 (65 FE) MG tablet Take 1 tablet (325 mg total) by mouth every other day. 15 tablet 3  . sulfamethoxazole-trimethoprim (BACTRIM DS,SEPTRA DS) 800-160 MG tablet Take 1 tablet by mouth daily. 30 tablet 5  . predniSONE (STERAPRED UNI-PAK 21 TAB) 10 MG (21) TBPK tablet 6 tablets with food day 1, 5 tablets day 2, - 4-3-2-1 (Patient not taking: Reported on 01/27/2019) 30 tablet 0   No facility-administered medications  prior to visit.      No Known Allergies  Social History   Tobacco Use  . Smoking status: Never Smoker  . Smokeless tobacco: Never Used  Substance Use Topics  . Alcohol use: Not Currently  . Drug use: No    Social History   Substance and Sexual Activity  Sexual Activity Not on file     Objective:   Vitals:   01/27/19 0924  BP: 128/80  Pulse: 78  Temp: 98 F (36.7 C)  Weight: 239 lb (108.4 kg)  Height: 5\' 8"   (1.727 m)  Pulse Ox on room air. Repeated heart rate low 100s after sitting.   Body mass index is 36.34 kg/m.  Physical Exam Constitutional:      Appearance: He is well-developed. He is not ill-appearing.     Comments: Seated comfortably in chair during visit. Appears well developed and well nourished.   HENT:     Nose: Congestion present.     Mouth/Throat:     Mouth: Mucous membranes are moist.     Dentition: Normal dentition. No dental abscesses.     Pharynx: Oropharynx is clear. No oropharyngeal exudate.  Eyes:     General: No scleral icterus.    Pupils: Pupils are equal, round, and reactive to light.  Cardiovascular:     Rate and Rhythm: Normal rate and regular rhythm.     Heart sounds: Normal heart sounds. No murmur.  Pulmonary:     Effort: Pulmonary effort is normal. No respiratory distress.     Breath sounds: Normal breath sounds. No wheezing, rhonchi or rales.  Abdominal:     General: There is no distension.     Palpations: Abdomen is soft.     Tenderness: There is no abdominal tenderness.  Musculoskeletal:     Right lower leg: No edema.     Left lower leg: No edema.  Lymphadenopathy:     Cervical: No cervical adenopathy.  Skin:    General: Skin is warm and dry.     Capillary Refill: Capillary refill takes less than 2 seconds.     Findings: No rash.     Comments: Flaky skin over face. Minimal. Hyperpigmented skin above brow line.   Neurological:     Mental Status: He is alert and oriented to person, place, and time.  Psychiatric:        Judgment: Judgment normal.     Comments: In good spirits today and engaged in care discussion.     Lab Results Lab Results  Component Value Date   WBC 6.3 10/29/2018   HGB 9.8 (L) 10/29/2018   HCT 31.2 (L) 10/29/2018   MCV 87.4 10/29/2018   PLT 644 (H) 10/29/2018    Lab Results  Component Value Date   CREATININE 1.13 11/24/2018   BUN 5 (L) 11/24/2018   NA 138 11/24/2018   K 4.2 11/24/2018   CL 103 11/24/2018   CO2  25 11/24/2018    Lab Results  Component Value Date   ALT 20 11/24/2018   AST 24 11/24/2018   ALKPHOS 132 (H) 10/21/2018   BILITOT 0.3 11/24/2018    No results found for: CHOL, HDL, LDLCALC, LDLDIRECT, TRIG, CHOLHDL HIV 1 RNA Quant (copies/mL)  Date Value  11/24/2018 197 (H)  10/21/2018 770,000   CD4 T Cell Abs (/uL)  Date Value  11/24/2018 30 (L)  10/24/2018 6 (L)    Assessment & Plan:   Problem List Items Addressed This Visit  Unprioritized   Healthcare maintenance    Prevnar and pneumovax when CD4 reconstitutes. He received flu shot this year.       AIDS (acquired immune deficiency syndrome) (HCC) - Primary    We reviewed labs today. Will repeat his VL/CD4 today. Discussed recommended vaccines - will wait on prevnar/pneumovax series until CD4 > 200 to ensure good immunologic response.  He will continue on bactrim 3x/week and biktarvy daily as he is currently taking.  Discussed again bactrim being short term medication and I hope we can stop this in 3- months time.  Return in about 3 months (around 04/29/2019).       Relevant Orders   HIV-1 RNA quant-no reflex-bld   T-helper cell (CD4)- ([facility] only)   CBC   Dry skin    He has some mild dry skin/dermatitis over the face. No rash present and low suspicion for s/e to bactrim. I suspect this is due more to frequent exfoliation and cleansing with soaps. Encouraged to discuss with his provider about sensitive skin/hydrating options to help. Also recommend humidifier in room to help moisten the air. This will also help with nosebleeds.  Continue bactrim as currently taking.       Allergic rhinosinusitis    Symptoms appear c/w allergic rhinosinusitis. Discussed starting zyrtec-d, frequent nasal saline irrigations, flonase if he experiences more refractory congestion.  Likely some mucosal inflammation driving nosebleeds - check CBC today to ensure platelets are normal. Humidifier in the bedroom.       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 His lung assessment is completely normal now.          Rexene Alberts, MSN, NP-C [facility] Pager: [phone] Office: [phone]

## 2019-01-28 LAB — T-HELPER CELL (CD4) - (RCID CLINIC ONLY)
CD4 % Helper T Cell: 4 % — ABNORMAL LOW (ref 33–55)
CD4 T Cell Abs: 60 /uL — ABNORMAL LOW (ref 400–2700)

## 2019-01-30 LAB — HIV-1 RNA QUANT-NO REFLEX-BLD
HIV 1 RNA Quant: <20 copies/mL
HIV-1 RNA Quant, Log: <1.3 Log copies/mL

## 2019-01-30 LAB — CBC
HCT: 34.8 % — ABNORMAL LOW (ref 38.5–50.0)
Hemoglobin: 11.7 g/dL — ABNORMAL LOW (ref 13.2–17.1)
MCH: 30.8 pg (ref 27.0–33.0)
MCHC: 33.6 g/dL (ref 32.0–36.0)
MCV: 91.6 fL (ref 80.0–100.0)
MPV: 10.9 fL (ref 7.5–12.5)
Platelets: 256 Thousand/uL (ref 140–400)
RBC: 3.8 Million/uL — ABNORMAL LOW (ref 4.20–5.80)
RDW: 15.6 % — ABNORMAL HIGH (ref 11.0–15.0)
WBC: 3.1 Thousand/uL — ABNORMAL LOW (ref 3.8–10.8)

## 2019-02-04 ENCOUNTER — Ambulatory Visit: Payer: Self-pay | Admitting: Family Medicine

## 2019-02-09 ENCOUNTER — Ambulatory Visit: Payer: Self-pay | Admitting: Family Medicine

## 2019-02-13 ENCOUNTER — Telehealth: Payer: Self-pay

## 2019-02-13 NOTE — Telephone Encounter (Signed)
Left a vm for patient to callback and schedule a appointment  °

## 2019-02-13 NOTE — Telephone Encounter (Signed)
-----   Message from Kallie Locks, FNP sent at 02/12/2019  7:24 PM EDT ----- Regarding: "Follow Up" Please schedule follow up appointment in 3 months. Thank you.

## 2019-02-19 NOTE — Telephone Encounter (Signed)
-----   Message from Natalie M Stroud, FNP sent at 02/12/2019  7:24 PM EDT ----- Regarding: "Follow Up" Please schedule follow up appointment in 3 months. Thank you.   

## 2019-02-19 NOTE — Telephone Encounter (Signed)
Left a vm for patient to callback to schedule a 3 month follow up

## 2019-02-23 ENCOUNTER — Ambulatory Visit: Payer: Self-pay | Admitting: Family Medicine

## 2019-02-23 ENCOUNTER — Other Ambulatory Visit: Payer: Self-pay

## 2019-02-23 ENCOUNTER — Encounter: Payer: Self-pay | Admitting: Family Medicine

## 2019-02-23 NOTE — Progress Notes (Deleted)
Virtual Visit via Telephone Note  I connected with Alex Mitchell on 02/23/19 at  8:40 AM EDT by telephone and verified that I am speaking with the correct person using two identifiers.   I discussed the limitations, risks, security and privacy concerns of performing an evaluation and management service by telephone and the availability of in person appointments. I also discussed with the patient that there may be a patient responsible charge related to this service. The patient expressed understanding and agreed to proceed.   History of Present Illness:  Past Medical History:  Diagnosis Date  . AIDS (acquired immune deficiency syndrome) (HCC) 10/31/2018    Current Outpatient Medications on File Prior to Visit  Medication Sig Dispense Refill  . azithromycin (ZITHROMAX) 600 MG tablet Take 2 tablets (1,200 mg total) by mouth once a week. 8 tablet 6  . bictegravir-emtricitabine-tenofovir AF (BIKTARVY) 50-200-25 MG TABS tablet Take 1 tablet by mouth daily. 30 tablet 6  . ferrous sulfate 325 (65 FE) MG tablet Take 1 tablet (325 mg total) by mouth every other day. 15 tablet 3  . predniSONE (STERAPRED UNI-PAK 21 TAB) 10 MG (21) TBPK tablet 6 tablets with food day 1, 5 tablets day 2, - 4-3-2-1 (Patient not taking: Reported on 01/27/2019) 30 tablet 0  . sulfamethoxazole-trimethoprim (BACTRIM DS,SEPTRA DS) 800-160 MG tablet Take 1 tablet by mouth daily. 30 tablet 5   No current facility-administered medications on file prior to visit.     Current Status: Since his last office visit, he is doing well with no complaints.      He denies fevers, chills, fatigue, recent infections, weight loss, and night sweats. He has not had any headaches, visual changes, dizziness, and falls. No chest pain, heart palpitations, cough and shortness of breath reported. No reports of GI problems such as nausea, vomiting, diarrhea, and constipation. He has no reports of blood in stools, dysuria and hematuria. No  depression or anxiety, and denies suicidal ideations, homicidal ideations, or auditory hallucinations. He denies pain today.    Observations/Objective:  Telephone Virtual Visit.   Assessment and Plan:   Follow Up Instructions:    I discussed the assessment and treatment plan with the patient. The patient was provided an opportunity to ask questions and all were answered. The patient agreed with the plan and demonstrated an understanding of the instructions.   The patient was advised to call back or seek an in-person evaluation if the symptoms worsen or if the condition fails to improve as anticipated.  I provided *** minutes of non-face-to-face time during this encounter.   Kallie Locks, FNP

## 2019-02-27 ENCOUNTER — Encounter: Payer: Self-pay | Admitting: Family Medicine

## 2019-03-06 IMAGING — CT CT ANGIO CHEST
2 of 6 series · 19 of 46 positions shown · IV contrast (ISOVUE 370)
Comparison: Chest x-ray 10/20/2018

CLINICAL DATA: Shortness of breath, productive cough.

EXAM:
CT ANGIOGRAPHY CHEST WITH CONTRAST
TECHNIQUE: Multidetector CT imaging of the chest was performed using the
standard protocol during bolus administration of intravenous
contrast. Multiplanar CT image reconstructions and MIPs were
obtained to evaluate the vascular anatomy.
CONTRAST:  100mL 91LY9W-1WM IOPAMIDOL (91LY9W-1WM) INJECTION 76%

[Series 5: thins · axial · 0.65mm/px · z∈[+157,+405]mm · 16 of 272 slices shown]
[im 12/272  lung]
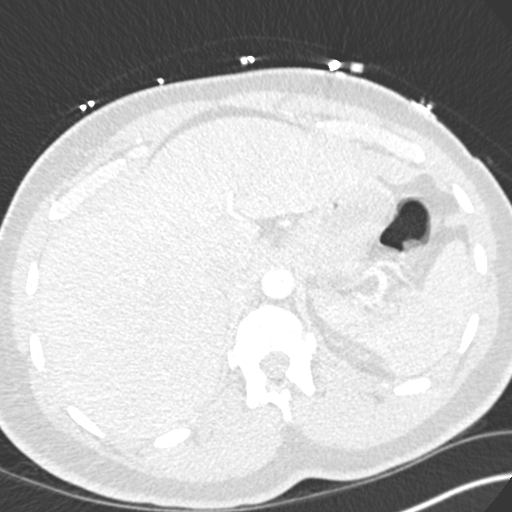
[im 36/272  soft-tissue]
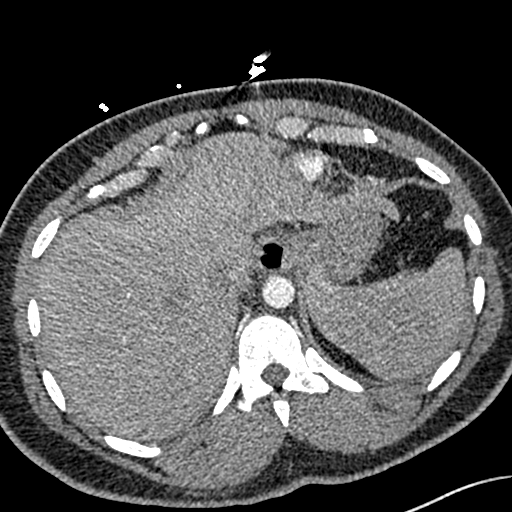
[im 48/272  lung]
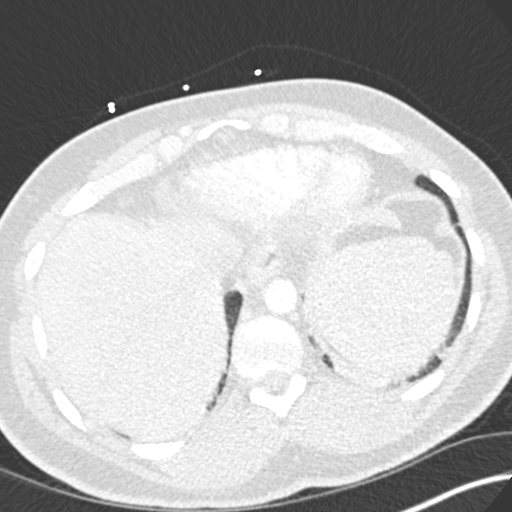
[im 59/272  soft-tissue]
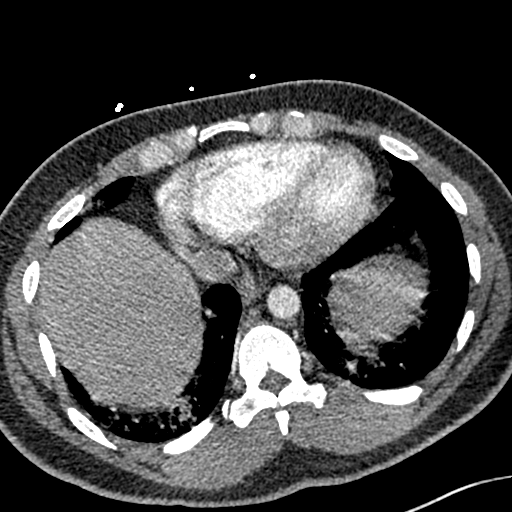
[im 83/272  lung]
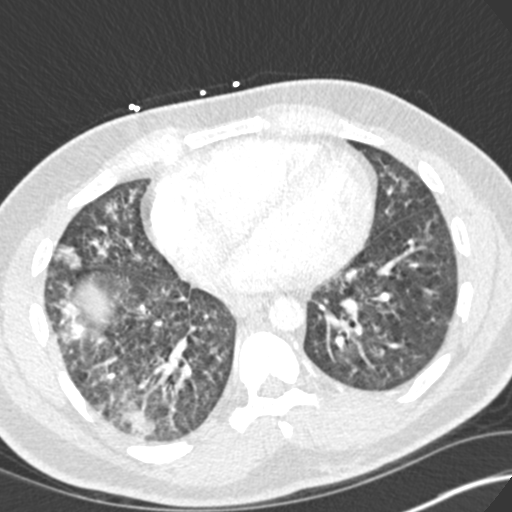
[im 95/272  soft-tissue]
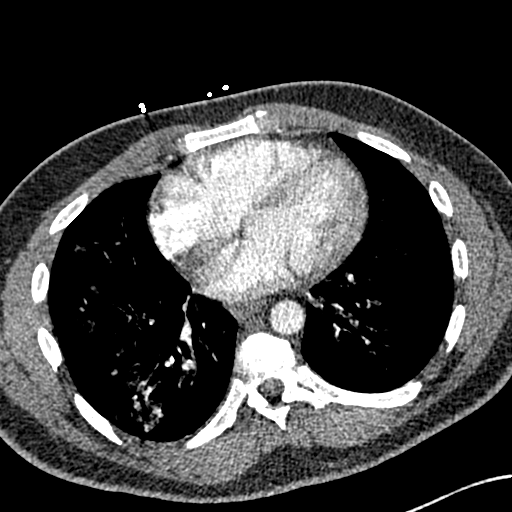
[im 107/272  lung]
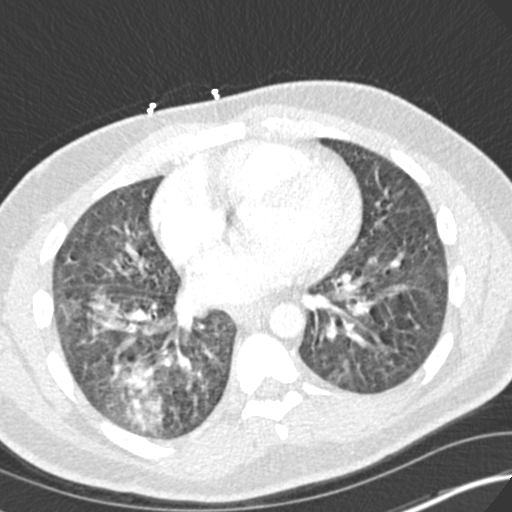
[im 130/272  soft-tissue]
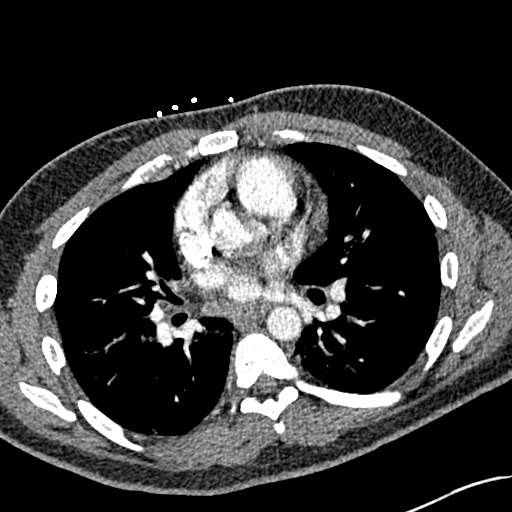
[im 142/272  lung]
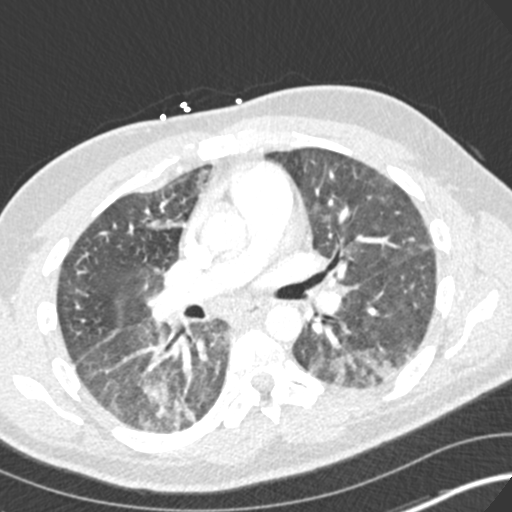
[im 165/272  soft-tissue]
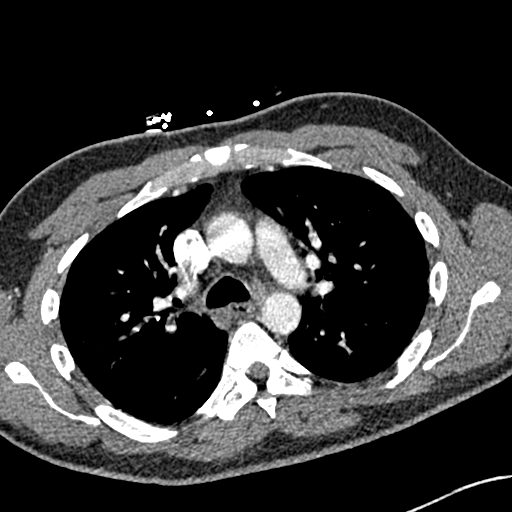
[im 177/272  lung]
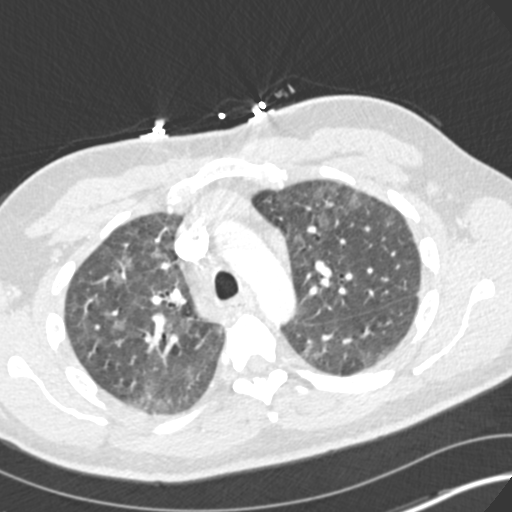
[im 189/272  soft-tissue]
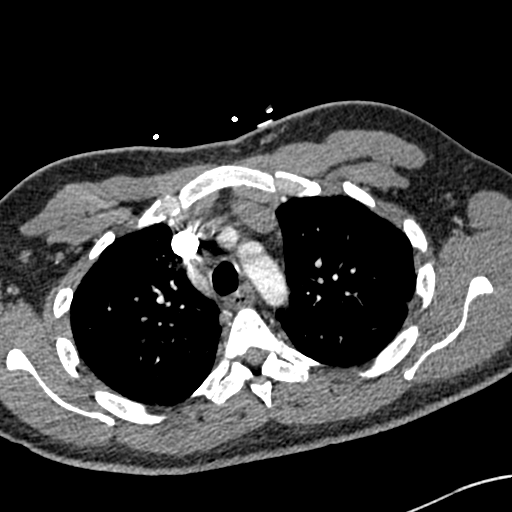
[im 213/272  lung]
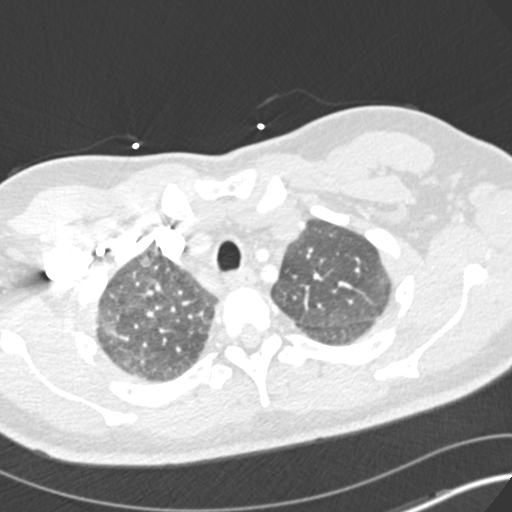
[im 224/272  soft-tissue]
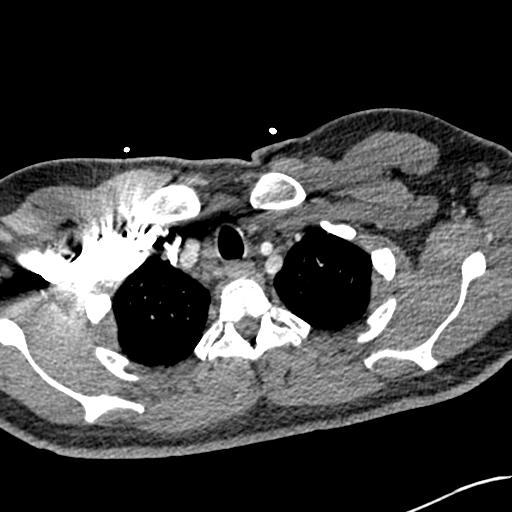
[im 236/272  lung]
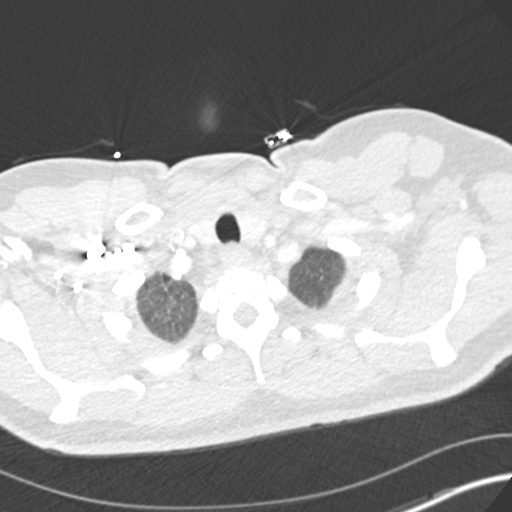
[im 260/272  soft-tissue]
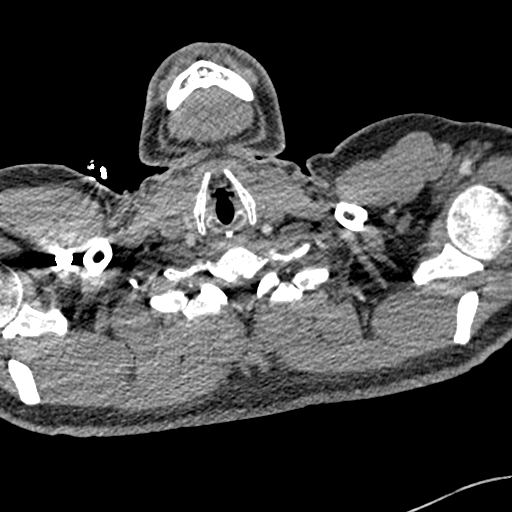

[Series 7: coronal mpr · coronal · 0.54mm/px · 3 of 126 slices shown]
[im 32/126  soft-tissue]
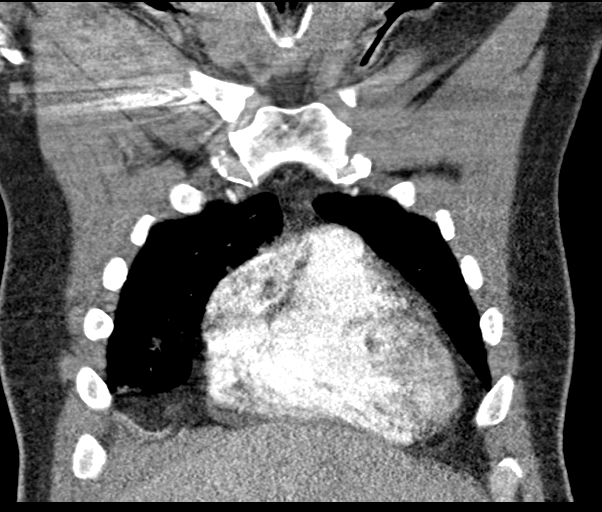
[im 63/126  soft-tissue]
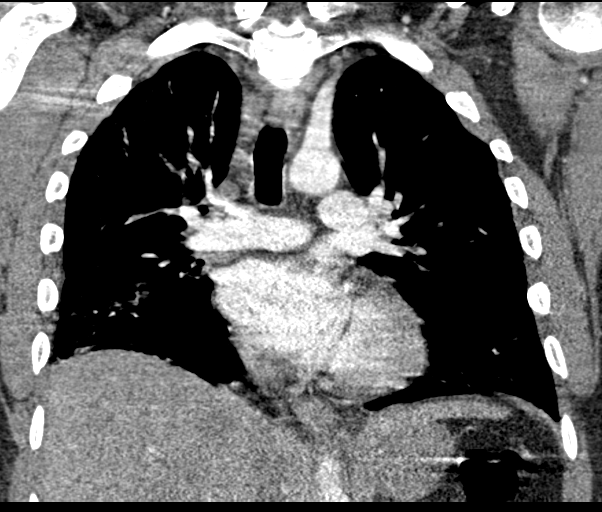
[im 94/126  soft-tissue]
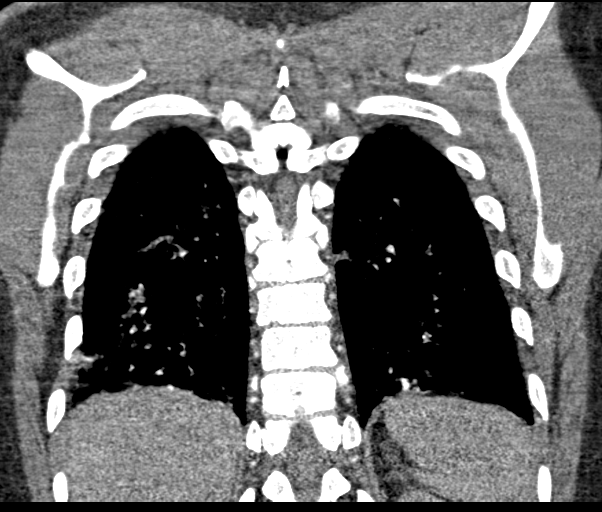

[19 of 46 positions shown; findings below may reference images not displayed]

FINDINGS: Cardiovascular: Heart is borderline in size. Aorta is normal
caliber. No filling defects in the pulmonary arteries to suggest
pulmonary emboli.

Mediastinum/Nodes: No mediastinal, hilar, or axillary adenopathy.

Lungs/Pleura: Bilateral airspace opacities, most confluent
throughout the right lung and in the left lower lobe concerning for
multifocal pneumonia. No effusions.

Upper Abdomen: Imaging into the upper abdomen shows no acute
findings.

Musculoskeletal: Chest wall soft tissues are unremarkable. No acute
bony abnormality.

Review of the MIP images confirms the above findings.
IMPRESSION: No evidence of pulmonary embolus.

Multifocal bilateral airspace opacities, right greater than left
concerning for multifocal pneumonia.

## 2019-03-13 ENCOUNTER — Other Ambulatory Visit: Payer: Self-pay

## 2019-03-13 ENCOUNTER — Ambulatory Visit (INDEPENDENT_AMBULATORY_CARE_PROVIDER_SITE_OTHER): Payer: Self-pay | Admitting: Family Medicine

## 2019-03-13 DIAGNOSIS — Z09 Encounter for follow-up examination after completed treatment for conditions other than malignant neoplasm: Secondary | ICD-10-CM

## 2019-03-13 DIAGNOSIS — K1379 Other lesions of oral mucosa: Secondary | ICD-10-CM | POA: Insufficient documentation

## 2019-03-13 DIAGNOSIS — B20 Human immunodeficiency virus [HIV] disease: Secondary | ICD-10-CM

## 2019-03-13 NOTE — Progress Notes (Signed)
Virtual Visit via Telephone Note  I connected with Alex Mitchell on 03/13/19 at  9:00 AM EDT by telephone and verified that I am speaking with the correct person using two identifiers.   I discussed the limitations, risks, security and privacy concerns of performing an evaluation and management service by telephone and the availability of in person appointments. I also discussed with the patient that there may be a patient responsible charge related to this service. The patient expressed understanding and agreed to proceed.   History of Present Illness:  Past Medical History:  Diagnosis Date  . AIDS (acquired immune deficiency syndrome) (HCC) 10/31/2018  . [place]   . Mouth sores     Current Outpatient Medications on File Prior to Visit  Medication Sig Dispense Refill  . azithromycin (ZITHROMAX) 600 MG tablet Take 2 tablets (1,200 mg total) by mouth once a week. 8 tablet 6  . bictegravir-emtricitabine-tenofovir AF (BIKTARVY) 50-200-25 MG TABS tablet Take 1 tablet by mouth daily. 30 tablet 6  . ferrous sulfate 325 (65 FE) MG tablet Take 1 tablet (325 mg total) by mouth every other day. 15 tablet 3  . predniSONE (STERAPRED UNI-PAK 21 TAB) 10 MG (21) TBPK tablet 6 tablets with food day 1, 5 tablets day 2, - 4-3-2-1 (Patient not taking: Reported on 01/27/2019) 30 tablet 0  . sulfamethoxazole-trimethoprim (BACTRIM DS,SEPTRA DS) 800-160 MG tablet Take 1 tablet by mouth daily. 30 tablet 5   No current facility-administered medications on file prior to visit.    Current Status: Since his last office visit, he continues to follow up with Infection Disease as needed. He has adopted a healthier lifestyle, by exercising regularly, and eating a heart healthy diet. He denies fevers, chills, fatigue, recent infections, weight loss, and night sweats. He has not had any headaches, visual changes, dizziness, and falls. No chest pain, heart palpitations, cough and shortness of breath reported. No reports of GI  problems such as nausea, vomiting, diarrhea, and constipation. She has no reports of blood in stools, dysuria and hematuria. No depression or anxiety reported. He denies pain today.   Observations/Objective:  Telephone Virtual Visit   Assessment and Plan:  1. Mouth sore Resolved.   2. AIDS (acquired immune deficiency syndrome) (HCC) Continue antiviral medications.   No orders of the defined types were placed in this encounter.   No orders of the defined types were placed in this encounter.   Referral Orders  No referral(s) requested today    Raliegh Ip,  MSN, FNP-C [facility] [address] [place], [place] [address] [phone]   Follow Up Instructions:  He will follow up in 6 months.    I discussed the assessment and treatment plan with the patient. The patient was provided an opportunity to ask questions and all were answered. The patient agreed with the plan and demonstrated an understanding of the instructions.   The patient was advised to call back or seek an in-person evaluation if the symptoms worsen or if the condition fails to improve as anticipated.  I provided 15 minutes of non-face-to-face time during this encounter.   Kallie Locks, FNP

## 2019-04-09 IMAGING — CR DG CHEST 2V
2 series · 2 of 2 positions shown · non-contrast
Comparison: Radiographs October 20, 2018.

CLINICAL DATA: Productive cough.

EXAM:
CHEST - 2 VIEW

[chest pa]
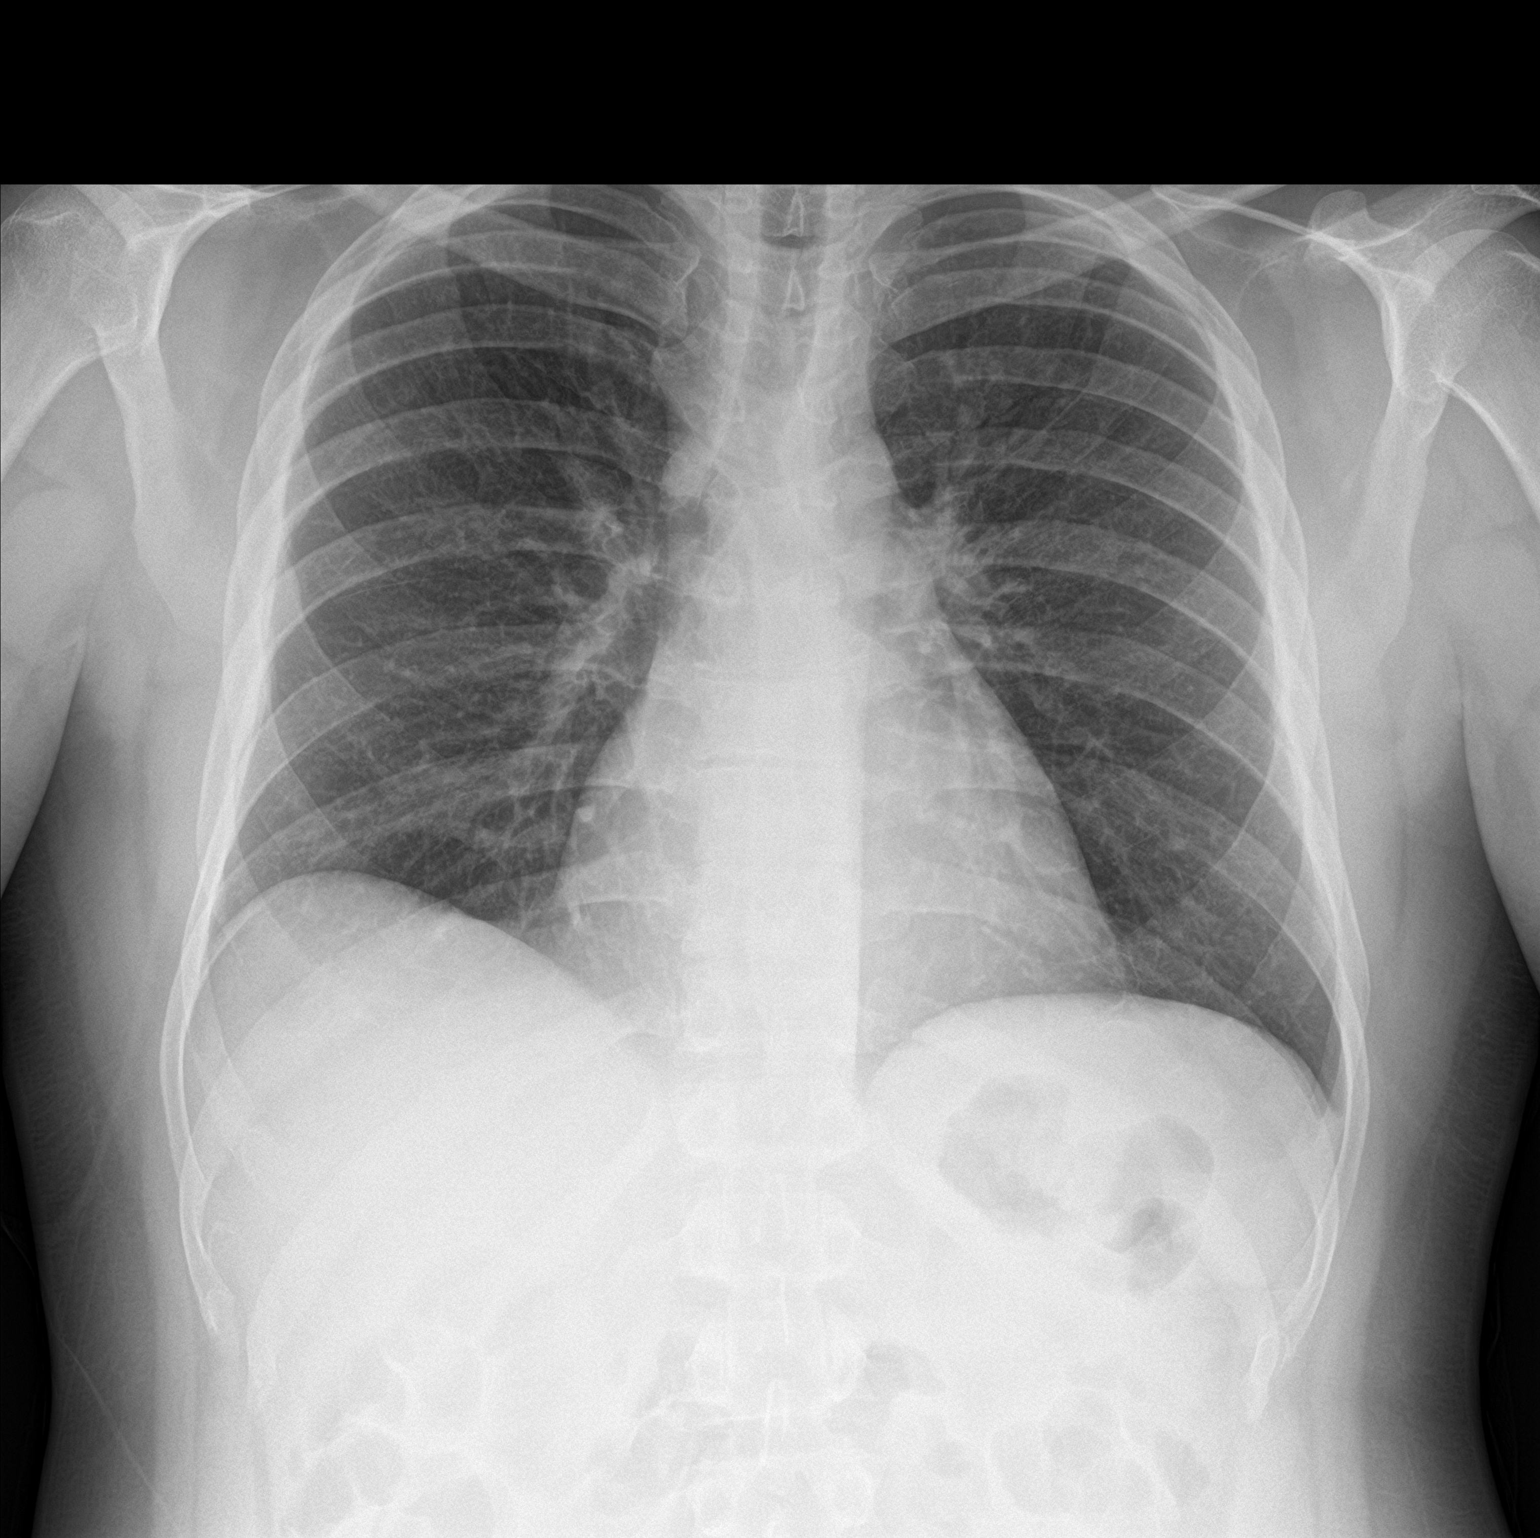

[chest lat]
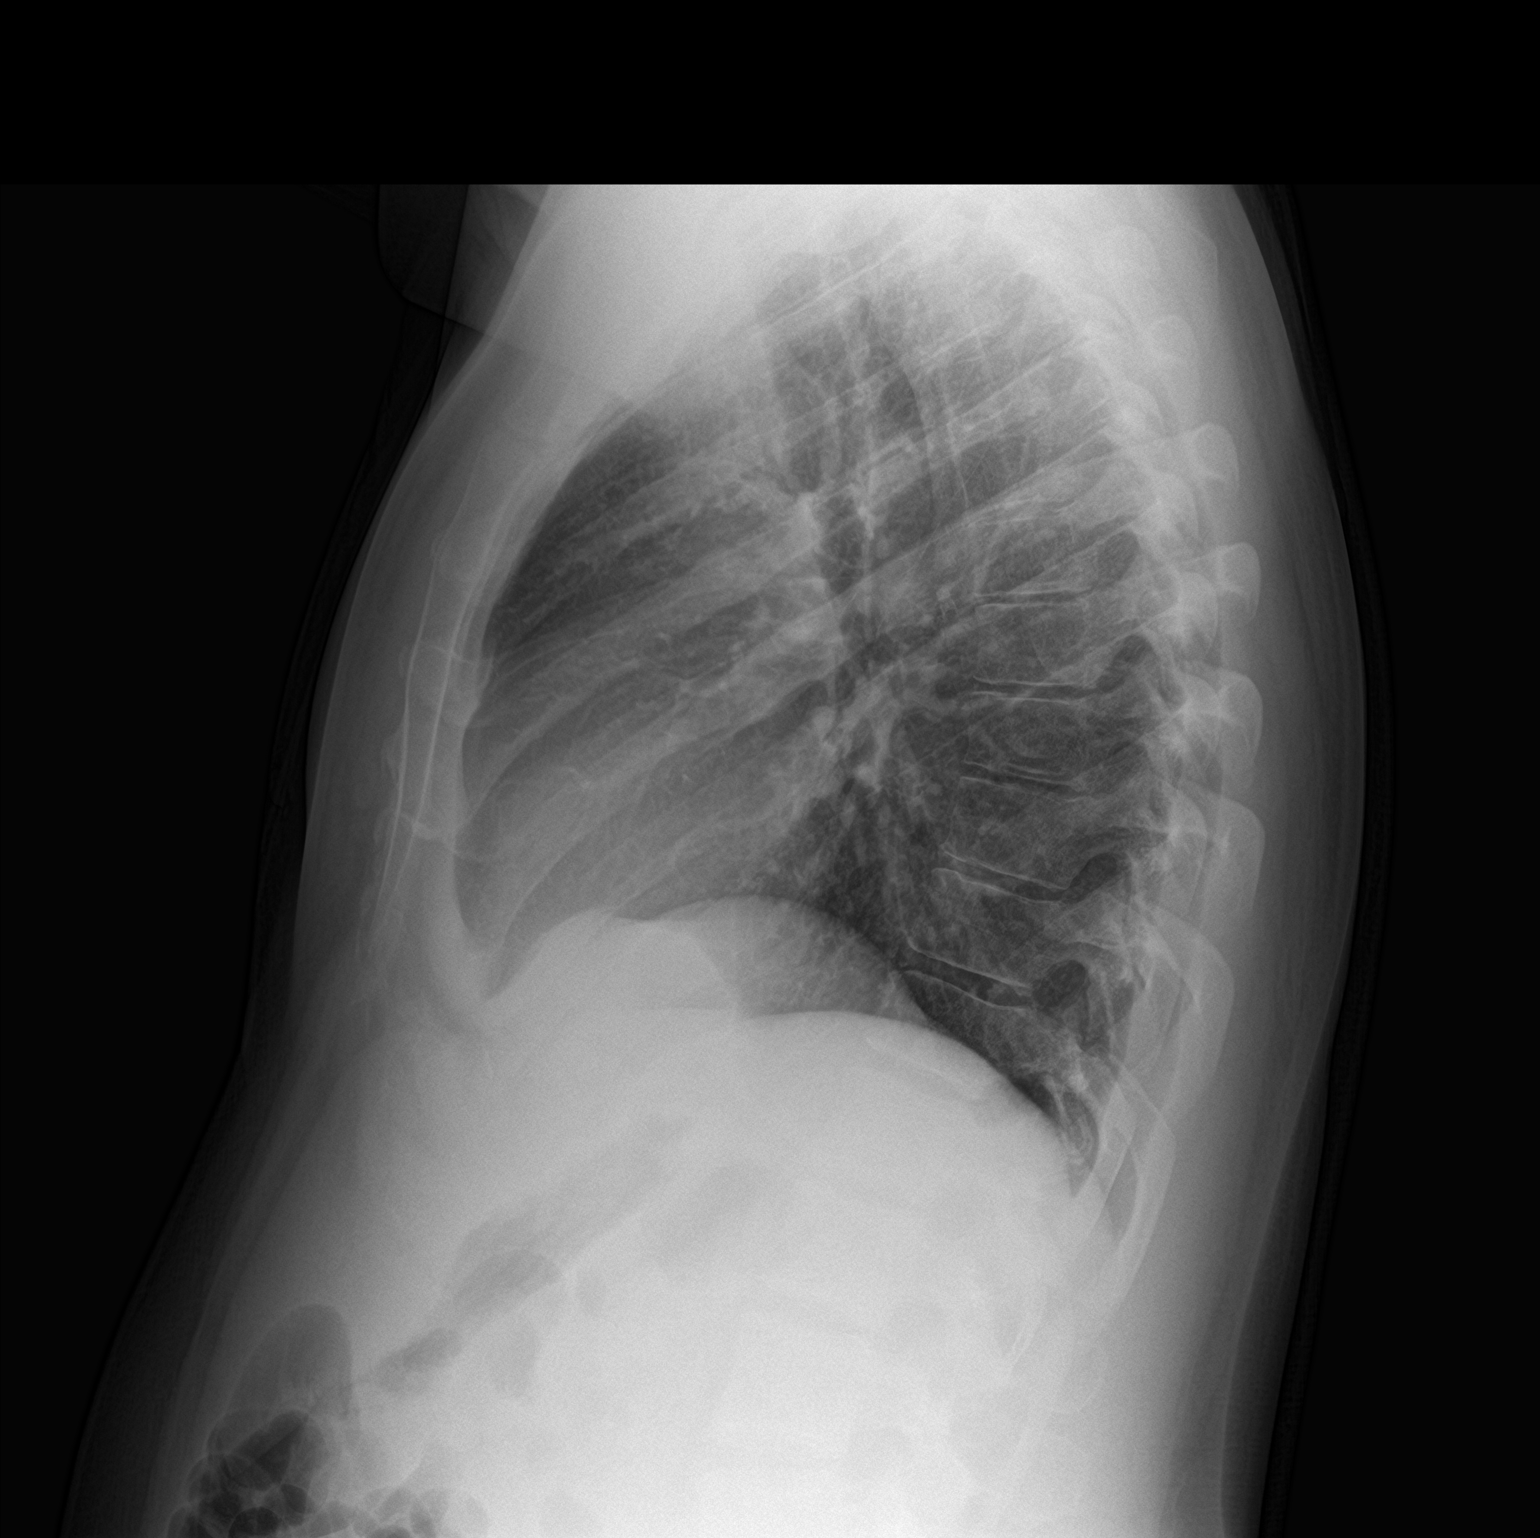

[2 of 2 positions shown; findings below may reference images not displayed]

FINDINGS: The heart size and mediastinal contours are within normal limits.
Both lungs are clear. The visualized skeletal structures are
unremarkable.
IMPRESSION: No active cardiopulmonary disease.

## 2019-05-09 ENCOUNTER — Other Ambulatory Visit: Payer: Self-pay | Admitting: Infectious Diseases

## 2019-05-09 DIAGNOSIS — B20 Human immunodeficiency virus [HIV] disease: Secondary | ICD-10-CM

## 2019-05-11 ENCOUNTER — Ambulatory Visit: Payer: Self-pay | Admitting: Infectious Diseases

## 2019-05-13 ENCOUNTER — Other Ambulatory Visit: Payer: Self-pay

## 2019-05-13 ENCOUNTER — Encounter: Payer: Self-pay | Admitting: Infectious Diseases

## 2019-05-13 ENCOUNTER — Ambulatory Visit (INDEPENDENT_AMBULATORY_CARE_PROVIDER_SITE_OTHER): Payer: Medicaid Other | Admitting: Infectious Diseases

## 2019-05-13 VITALS — BP 126/77 | HR 91 | Temp 98.4°F | Ht 69.0 in | Wt 230.0 lb

## 2019-05-13 DIAGNOSIS — D509 Iron deficiency anemia, unspecified: Secondary | ICD-10-CM

## 2019-05-13 DIAGNOSIS — B20 Human immunodeficiency virus [HIV] disease: Secondary | ICD-10-CM

## 2019-05-13 NOTE — Assessment & Plan Note (Deleted)
Since were doing blood I will go ahead and repeat his iron panel and compared to results 6 months ago.  I gave him information on iron fortified diet.  I suspect his anemia is multifactorial given his myelosuppression with chronic and severe HIV.  This should improve as his HIV remains under good control. Recommended to continue with every other day iron until we get lab work back for now. 

## 2019-05-13 NOTE — Patient Instructions (Addendum)
You look wonderful, it is always nice to see you.  Happy to hear that you are doing very well.  Please continue to take your Biktarvy every day.  We will check your viral load today  Please continue to take your Bactrim every Monday Wednesday Friday.  We will check your CD4 count today.  I am hopeful that we can discontinue the Bactrim sometime in the next 6 months.  Please stop your weekly azithromycin -your immune system has improved enough that we can stop this.  Please continue to take your iron every other day for now.  We will send a note when your lab work comes back to see how your iron panel is doing compared to December.  I will also check your hemoglobin, this tells us whether you are still anemic or not.  For dental clinic -  [facility] @ [phone] to schedule a dental appointment   Please come back to see Judeth CornfieldStephanie again in 3 months.  We can do lab work at the visit.   Iron Deficiency Anemia, Adult Iron-deficiency anemia is when you have a low amount of red blood cells or hemoglobin. This happens because you have too little iron in your body. Hemoglobin carries oxygen to parts of the body. Anemia can cause your body to not get enough oxygen. It may or may not cause symptoms. Follow these instructions at home: Medicines  Take over-the-counter and prescription medicines only as told by your doctor. This includes iron pills (supplements) and vitamins.  If you cannot handle taking iron pills by mouth, ask your doctor about getting iron through: ? A vein (intravenously). ? A shot (injection) into a muscle.  Take iron pills when your stomach is empty. If you cannot handle this, take them with food.  Do not drink milk or take antacids at the same time as your iron pills.  To prevent trouble pooping (constipation), eat fiber or take medicine (stool softener) as told by your doctor. Eating and drinking   Talk with your doctor before changing the foods you eat. He or  she may tell you to eat foods that have a lot of iron, such as: ? Liver. ? Lowfat (lean) beef. ? Breads and cereals that have iron added to them (fortified breads and cereals). ? Eggs. ? Dried fruit. ? Dark green, leafy vegetables.  Drink enough fluid to keep your pee (urine) clear or pale yellow.  Eat fresh fruits and vegetables that are high in vitamin C. They help your body to use iron. Foods with a lot of vitamin C include: ? Oranges. ? Peppers. ? Tomatoes. ? Mangoes. General instructions  Return to your normal activities as told by your doctor. Ask your doctor what activities are safe for you.  Keep yourself clean, and keep things clean around you (your surroundings). Anemia can make you get sick more easily.  Keep all follow-up visits as told by your doctor. This is important. Contact a doctor if:  You feel sick to your stomach (nauseous).  You throw up (vomit).  You feel weak.  You are sweating for no clear reason.  You have trouble pooping, such as: ? Pooping (having a bowel movement) less than 3 times a week. ? Straining to poop. ? Having poop that is hard, dry, or larger than normal. ? Feeling full or bloated. ? Pain in the lower belly. ? Not feeling better after pooping. Get help right away if:  You pass out (faint). If this happens, do  not drive yourself to the hospital. Call your local emergency services (911 in the [place]  You have chest pain.  You have shortness of breath that: ? Is very bad. ? Gets worse with physical activity.  You have a fast heartbeat.  You get light-headed when getting up from sitting or lying down. This information is not intended to replace advice given to you by your health care provider. Make sure you discuss any questions you have with your health care provider. Document Released: 12/08/2010 Document Revised: 07/25/2016 Document Reviewed: 07/25/2016 Elsevier Interactive Patient Education  2019 [employer].

## 2019-05-13 NOTE — Assessment & Plan Note (Signed)
Since were doing blood I will go ahead and repeat his iron panel and compared to results 6 months ago.  I gave him information on iron fortified diet.  I suspect his anemia is multifactorial given his myelosuppression with chronic and severe HIV.  This should improve as his HIV remains under good control. Recommended to continue with every other day iron until we get lab work back for now.

## 2019-05-13 NOTE — Assessment & Plan Note (Addendum)
No signs of opportunistic infection on exam today.  His CD4 is high enough to stop azithromycin weekly.  We will continue his 3 times a week Bactrim and repeat CD4 count today.  We will hold on vaccines for now. He was done very very well early into his HIV treatment with the [facility] and has demonstrated excellent adherence.  Check viral load today. Discussed U=U concept in addition to safe sex counseling and prevention of other STIs.  No STI screening indicated today.  Condoms declined.

## 2019-05-13 NOTE — Progress Notes (Signed)
Name: Alex Mitchell  DOB: 05-11-1981 MRN: [id number] PCP: Azzie Glatter, FNP    Patient Active Problem List   Diagnosis Date Noted  . Iron deficiency anemia 05/13/2019  . 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 Healthcare maintenance 10/31/2018  . AIDS (acquired immune deficiency syndrome) ([place]) 10/31/2018  . Leukopenia 10/21/2018  . Anemia 10/21/2018  . Proteinuria 10/21/2018  . Elevated LFTs 10/21/2018     Brief Narrative:  Alex Mitchell is a 38 y.o. male with HIV infection (+) AIDS with CD4 6, VL 770,000 copies; dx during hospitalization 09-2018 with PJP pneumonia. HIV Risk: MSM. History of OIs: PJP pna, esophageal candidiasis   Previous Regimens: . Biktarvy 09-2018   Genotypes: . Not performed in hospital   Subjective:  CC:  HIV follow-up care.   HPI:  Since her last office visit [place] continues to feel very good.  He continues his walking regimen and program with intermittent running.  He has really taken to heart is rapid weight gain at last meeting and has tried to address and understand his portion control.  He is reintroduced meat to his diet and is using a lot of fresh veggies and fruits and the raw forms induced.  He is taking his Bactrim every other day as instructed, azithromycin once weekly and Biktarvy every day.  He is faithfully not missed a single dose since starting this and knows how important daily adherence is.  He has no further trouble with wheezing specifically and denies any other concerns over his health today.  No new sexual partners or encounters.  No concerns over depressed or anxious mood.  He is sleeping well.  He is taking iron tablets every other day.  He is curious to see if his anemia is under better control.  He does note that he is cold a lot of the times but could not tolerate daily iron as he was constipated with it.    He is asking when the dental clinic will reinstate.  No acute need only needs routine cleanings.  Review of Systems   Constitutional: Negative for chills, fever, malaise/fatigue and weight loss.  HENT: Negative for sore throat.        No dental problems  Respiratory: Negative for cough and sputum production.   Cardiovascular: Negative for chest pain and leg swelling.  Gastrointestinal: Negative for abdominal pain, diarrhea and vomiting.  Genitourinary: Negative for dysuria and flank pain.  Musculoskeletal: Negative for joint pain, myalgias and neck pain.  Skin: Negative for rash.  Neurological: Negative for dizziness, tingling and headaches.  Psychiatric/Behavioral: Negative for depression and substance abuse. The patient is not nervous/anxious and does not have insomnia.     Outpatient Medications Prior to Visit  Medication Sig Dispense Refill  . azithromycin (ZITHROMAX) 600 MG tablet TAKE 2 TABLETS(1200 MG) BY MOUTH 1 TIME A WEEK 8 tablet 6  . bictegravir-emtricitabine-tenofovir AF (BIKTARVY) 50-200-25 MG TABS tablet Take 1 tablet by mouth daily. 30 tablet 6  . ferrous sulfate 325 (65 FE) MG tablet Take 1 tablet (325 mg total) by mouth every other day. 15 tablet 3  . sulfamethoxazole-trimethoprim (BACTRIM DS,SEPTRA DS) 800-160 MG tablet Take 1 tablet by mouth daily. 30 tablet 5  . predniSONE (STERAPRED UNI-PAK 21 TAB) 10 MG (21) TBPK tablet 6 tablets with food day 1, 5 tablets day 2, - 4-3-2-1 (Patient not taking: Reported on 05/13/2019) 30 tablet 0   No facility-administered medications prior to visit.      No Known Allergies  Social  History   Tobacco Use  . Smoking status: Never Smoker  . Smokeless tobacco: Never Used  Substance Use Topics  . Alcohol use: Not Currently  . Drug use: No    Social History   Substance and Sexual Activity  Sexual Activity Not on file     Objective:   Vitals:   05/13/19 1352  BP: 126/77  Pulse: 91  Temp: 98.4 F (36.9 C)  TempSrc: Oral  Weight: 230 lb (104.3 kg)  Height: 5\' 9"  (1.753 m)   Body mass index is 33.97 kg/m.  Physical Exam  Constitutional:      Appearance: He is well-developed.     Comments: Seated comfortably in chair during visit.   HENT:     Mouth/Throat:     Dentition: Normal dentition. No dental abscesses.  Cardiovascular:     Rate and Rhythm: Normal rate and regular rhythm.     Heart sounds: Normal heart sounds.  Pulmonary:     Effort: Pulmonary effort is normal.     Breath sounds: Normal breath sounds.  Abdominal:     General: There is no distension.     Palpations: Abdomen is soft.     Tenderness: There is no abdominal tenderness.  Lymphadenopathy:     Cervical: No cervical adenopathy.  Skin:    General: Skin is warm and dry.     Findings: No rash.  Neurological:     Mental Status: He is alert and oriented to person, place, and time.  Psychiatric:        Judgment: Judgment normal.     Comments: In good spirits today and engaged in care discussion.     Lab Results Lab Results  Component Value Date   WBC 3.1 (L) 01/27/2019   HGB 11.7 (L) 01/27/2019   HCT 34.8 (L) 01/27/2019   MCV 91.6 01/27/2019   PLT 256 01/27/2019    Lab Results  Component Value Date   CREATININE 1.13 11/24/2018   BUN 5 (L) 11/24/2018   NA 138 11/24/2018   K 4.2 11/24/2018   CL 103 11/24/2018   CO2 25 11/24/2018    Lab Results  Component Value Date   ALT 20 11/24/2018   AST 24 11/24/2018   ALKPHOS 132 (H) 10/21/2018   BILITOT 0.3 11/24/2018    No results found for: CHOL, HDL, LDLCALC, LDLDIRECT, TRIG, CHOLHDL HIV 1 RNA Quant (copies/mL)  Date Value  01/27/2019 <20 NOT DETECTED  11/24/2018 197 (H)  10/21/2018 770,000   CD4 T Cell Abs (/uL)  Date Value  01/27/2019 60 (L)  11/24/2018 30 (L)  10/24/2018 6 (L)    Assessment & Plan:   Problem List Items Addressed This Visit      Unprioritized   AIDS (acquired immune deficiency syndrome) ([facility]) - Primary    No signs of opportunistic infection on exam today.  His CD4 is high enough to stop azithromycin weekly.  We will continue his 3 times a week  Bactrim and repeat CD4 count today.  We will hold on vaccines for now. He was done very very well early into [facility] and has demonstrated excellent adherence.  Check viral load today. Discussed U=U concept in addition to safe sex counseling and prevention of other STIs.  No STI screening indicated today.  Condoms declined.       Relevant Orders   HIV-1 RNA quant-no reflex-bld   T-helper cell (CD4)- ([facility] clinic only)   Anemia    Since were  doing blood I will go ahead and repeat his iron panel and compared to results 6 months ago.  I gave him information on iron fortified diet.  I suspect his anemia is multifactorial given his myelosuppression with chronic and severe HIV.  This should improve as his HIV remains under good control. Recommended to continue with every other day iron until we get lab work back for now.      Relevant Orders   Iron, TIBC and Ferritin Panel   Hemoglobin and hematocrit, blood      Rexene AlbertsStephanie Dixon, MSN, NP-C [facility] Pager: [phone] Office: [phone]

## 2019-05-14 LAB — T-HELPER CELL (CD4) - (RCID CLINIC ONLY)
CD4 % Helper T Cell: 9 % — ABNORMAL LOW (ref 33–65)
CD4 T Cell Abs: 195 /uL — ABNORMAL LOW (ref 400–1790)

## 2019-05-18 LAB — HIV-1 RNA QUANT-NO REFLEX-BLD
HIV 1 RNA Quant: <20 copies/mL
HIV-1 RNA Quant, Log: <1.3 Log copies/mL

## 2019-05-18 LAB — HEMOGLOBIN AND HEMATOCRIT, BLOOD
HCT: 31.1 % — ABNORMAL LOW (ref 38.5–50.0)
Hemoglobin: 10.2 g/dL — ABNORMAL LOW (ref 13.2–17.1)

## 2019-05-18 LAB — IRON,TIBC AND FERRITIN PANEL
%SAT: 9 % (calc) — ABNORMAL LOW (ref 20–48)
Ferritin: 716 ng/mL — ABNORMAL HIGH (ref 38–380)
Iron: 21 ug/dL — ABNORMAL LOW (ref 50–180)
TIBC: 246 mcg/dL (calc) — ABNORMAL LOW (ref 250–425)

## 2019-05-18 NOTE — Progress Notes (Signed)
Will have him increase iron to QD instead of every other as he was taking. Continue Bactrim x 62m and reassess CD4.

## 2019-06-09 ENCOUNTER — Other Ambulatory Visit: Payer: Self-pay | Admitting: Infectious Diseases

## 2019-06-09 DIAGNOSIS — B20 Human immunodeficiency virus [HIV] disease: Secondary | ICD-10-CM

## 2019-06-12 ENCOUNTER — Ambulatory Visit: Payer: Medicaid Other | Admitting: Family Medicine

## 2019-06-30 ENCOUNTER — Telehealth: Payer: Medicaid Other | Admitting: Family Medicine

## 2019-07-04 ENCOUNTER — Other Ambulatory Visit: Payer: Self-pay | Admitting: Internal Medicine

## 2019-07-04 DIAGNOSIS — B20 Human immunodeficiency virus [HIV] disease: Secondary | ICD-10-CM

## 2019-07-06 ENCOUNTER — Other Ambulatory Visit: Payer: Self-pay

## 2019-07-06 DIAGNOSIS — B20 Human immunodeficiency virus [HIV] disease: Secondary | ICD-10-CM

## 2019-07-06 MED ORDER — SULFAMETHOXAZOLE-TRIMETHOPRIM 800-160 MG PO TABS: 1.0000 | ORAL_TABLET | Freq: Every day | ORAL | 1 refills | Status: DC

## 2019-07-06 MED ORDER — BIKTARVY 50-200-25 MG PO TABS: 1.0000 | ORAL_TABLET | Freq: Every day | ORAL | 3 refills | Status: DC

## 2019-07-08 ENCOUNTER — Ambulatory Visit: Payer: Self-pay

## 2019-07-08 ENCOUNTER — Encounter: Payer: Self-pay | Admitting: Infectious Diseases

## 2019-07-08 ENCOUNTER — Other Ambulatory Visit: Payer: Self-pay

## 2019-07-10 ENCOUNTER — Telehealth: Payer: Medicaid Other | Admitting: Family Medicine

## 2019-08-13 ENCOUNTER — Other Ambulatory Visit: Payer: Self-pay

## 2019-08-13 ENCOUNTER — Encounter: Payer: Self-pay | Admitting: Infectious Diseases

## 2019-08-13 ENCOUNTER — Ambulatory Visit (INDEPENDENT_AMBULATORY_CARE_PROVIDER_SITE_OTHER): Payer: Self-pay | Admitting: Infectious Diseases

## 2019-08-13 VITALS — BP 129/79 | HR 78 | Temp 98.3°F

## 2019-08-13 DIAGNOSIS — D509 Iron deficiency anemia, unspecified: Secondary | ICD-10-CM

## 2019-08-13 DIAGNOSIS — Z0000 Encounter for general adult medical examination without abnormal findings: Secondary | ICD-10-CM

## 2019-08-13 DIAGNOSIS — Z23 Encounter for immunization: Secondary | ICD-10-CM

## 2019-08-13 DIAGNOSIS — B20 Human immunodeficiency virus [HIV] disease: Secondary | ICD-10-CM

## 2019-08-13 MED ORDER — SULFAMETHOXAZOLE-TRIMETHOPRIM 800-160 MG PO TABS: 1.0000 | ORAL_TABLET | Freq: Every day | ORAL | 1 refills | Status: DC

## 2019-08-13 MED ORDER — BIKTARVY 50-200-25 MG PO TABS: 1.0000 | ORAL_TABLET | Freq: Every day | ORAL | 5 refills | Status: DC

## 2019-08-13 NOTE — Progress Notes (Signed)
Name: Alex Mitchell  DOB: December 07, 1980 MRN: [id number] PCP: Kallie LocksStroud, Natalie M, FNP    Patient Active Problem List   Diagnosis Date Noted  . Iron deficiency anemia 05/13/2019  . 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 Healthcare maintenance 10/31/2018  . AIDS (acquired immune deficiency syndrome) ([facility]) 10/31/2018  . Leukopenia 10/21/2018  . Proteinuria 10/21/2018  . Elevated LFTs 10/21/2018     Brief Narrative:  Alex Mitchell is a 38 y.o. 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  History of OIs: PJP pna, esophageal candidiasis   Previous Regimens: . Biktarvy 09-2018   Genotypes: . Not performed in hospital   Subjective:  CC:  HIV follow-up care.   HPI:  Alex Mitchell is feeling great. He is a little upset about weight gain and wonders if he has put on any more since last visit. He had lost 9 lbs with increasing activity prior to today - unfortunately our scale is broken. He will pick up a scale at home for use and monitoring. Getting ready to start on a diet that has him eating a lot of fish, eggs and mostly vegetables. He will allow himself to eat more animal products/meats on weekends. He wants to know if this is OK.   He has continued his Biktarvy every day since last visit without missing a dose. He has no side effects or concern with accessing his medication. He knows that it is his "lifeline" and he will need to continue taking this forever to stay healthy.   Taking iron tablets every day as discussed before. Energy is much better. No shortness of breath or daytime dragging anymore. Paying careful attention to separate from Biktarvy dosing.    Review of Systems  Constitutional: Negative for chills, fever, malaise/fatigue and weight loss. Weight gain HENT: Negative for sore throat.        No dental problems  Respiratory: Negative for cough and sputum production.   Cardiovascular: Negative for chest pain and  leg swelling.  Gastrointestinal: Negative for abdominal pain, diarrhea and vomiting.  Genitourinary: Negative for dysuria and flank pain.  Musculoskeletal: Negative for joint pain, myalgias and neck pain.  Skin: Negative for rash.  Neurological: Negative for dizziness, tingling and headaches.  Psychiatric/Behavioral: Negative for depression and substance abuse. The patient is not nervous/anxious and does not have insomnia.     Outpatient Medications Prior to Visit  Medication Sig Dispense Refill  . bictegravir-emtricitabine-tenofovir AF (BIKTARVY) 50-200-25 MG TABS tablet Take 1 tablet by mouth daily. 30 tablet 3  . ferrous sulfate 325 (65 FE) MG tablet Take 1 tablet (325 mg total) by mouth every other day. 15 tablet 3  . sulfamethoxazole-trimethoprim (BACTRIM DS) 800-160 MG tablet Take 1 tablet by mouth daily. 30 tablet 1  . azithromycin (ZITHROMAX) 600 MG tablet TAKE 2 TABLETS(1200 MG) BY MOUTH 1 TIME A WEEK (Patient not taking: Reported on 08/13/2019) 8 tablet 6  . predniSONE (STERAPRED UNI-PAK 21 TAB) 10 MG (21) TBPK tablet 6 tablets with food day 1, 5 tablets day 2, - 4-3-2-1 (Patient not taking: Reported on 08/13/2019) 30 tablet 0   No facility-administered medications prior to visit.      No Known Allergies  Social History   Tobacco Use  . Smoking status: Never Smoker  . Smokeless tobacco: Never Used  Substance Use Topics  . Alcohol use: Not Currently  . Drug use: No    Social History   Substance and Sexual  Activity  Sexual Activity Not on file     Objective:   Vitals:   08/13/19 1000  BP: 129/79  Pulse: 78  Temp: 98.3 F (36.8 C)   There is no height or weight on file to calculate BMI.  Physical Exam Constitutional:     Appearance: He is well-developed, well-nourished and alert.     Comments: Seated comfortably in chair during visit.   HENT:     Mouth/Throat:     Dentition: Normal dentition. No dental abscesses. No coating or lesions.  Cardiovascular:      Rate and Rhythm: Normal rate and regular rhythm.     Heart sounds: Normal heart sounds.  Pulmonary:     Effort: Pulmonary effort is normal.     Breath sounds: Normal breath sounds.  Abdominal:     General: There is no distension.     Palpations: Abdomen is soft.     Tenderness: There is no abdominal tenderness.  Lymphadenopathy:     Cervical: No cervical adenopathy.  Skin:    General: Skin is warm and dry.     Findings: No rash.  Neurological:     Mental Status: He is alert and oriented to person, place, and time.  Psychiatric:        Judgment: Judgment normal.     Comments: In good spirits today and engaged in care discussion.     Lab Results Lab Results  Component Value Date   WBC 3.1 (L) 01/27/2019   HGB 13.1 (L) 08/13/2019   HCT 31.1 (L) 05/13/2019   MCV 91.6 01/27/2019   PLT 256 01/27/2019    Lab Results  Component Value Date   CREATININE 1.13 11/24/2018   BUN 5 (L) 11/24/2018   NA 138 11/24/2018   K 4.2 11/24/2018   CL 103 11/24/2018   CO2 25 11/24/2018    Lab Results  Component Value Date   ALT 20 11/24/2018   AST 24 11/24/2018   ALKPHOS 132 (H) 10/21/2018   BILITOT 0.3 11/24/2018    No results found for: CHOL, HDL, LDLCALC, LDLDIRECT, TRIG, CHOLHDL HIV 1 RNA Quant (copies/mL)  Date Value  05/13/2019 <20 NOT DETECTED  01/27/2019 <20 NOT DETECTED  11/24/2018 197 (H)   CD4 T Cell Abs (/uL)  Date Value  08/13/2019 221 (L)  05/13/2019 195 (L)  01/27/2019 60 (L)    Assessment & Plan:   Problem List Items Addressed This Visit      Unprioritized   Healthcare maintenance    Flu shot and prevnar given today. Pneumovax and menveo #1 at upcoming visit.       AIDS (acquired immune deficiency syndrome) (HCC)    He has been doing very well with adherence. Since starting his [place]anne Borders he has been undetectable. Will update labs today and have him return in 4 months.  Vaccines updated.  Hopefully we can stop bactrim if CD4 > 200 this time.        Relevant Medications   bictegravir-emtricitabine-tenofovir AF (BIKTARVY) 50-200-25 MG TABS tablet   Other Relevant Orders   T-helper cell (CD4)- ([facility] only) (Completed)   HIV-1 RNA quant-no reflex-bld   Iron deficiency anemia - Primary    Will repeat Hgb and iron panel today. Hopeful we can drop back to 3x a week then stop and recheck  My only concern with his diet plan would be decreased iron intake. Discussed non-meat sources including certain beans, eggs, leafy green vegetables. Even cooking in cast iron skillet would be  helpful for him.       Relevant Medications   ferrous sulfate 325 (65 FE) MG tablet   Other Relevant Orders   Iron, TIBC and Ferritin Panel (Completed)   Hemoglobin (Completed)   Pneumococcal conjugate vaccine 13-valent IM (Completed)    Other Visit Diagnoses    Need for pneumococcal vaccine       Relevant Orders   Pneumococcal conjugate vaccine 13-valent IM (Completed)   Need for immunization against influenza       Relevant Orders   Flu Vaccine QUAD 36+ mos IM (Completed)      Janene Madeira, MSN, NP-C [facility] [place] Pager: [phone] Office: [phone]

## 2019-08-13 NOTE — Patient Instructions (Addendum)
Lovely to see you! Good luck on the weight loss challenge. Remember think of fats and carbohydrates as a see-saw; don't have them both together all the time. That is a recipe for weight gain.   Please continue your Biktarvy once a day.  Please continue your Bactrim once a day for now until we get your labs back. If your CD4 is > 200.  Please continue your Iron pill once a day for now.   Continue your humidifier at night.   Please start back taking your antihistamine (zyrtec, allegra, claritin)  Consider adding Flonase nasal spray 1 spray per nostril 1-2 times a day. Give it a good week to see if this helps.   To help with your nausea taking your Biktarvy with food and/or a full glass of water.    Vaccines given today: 1. Flu shot (recommended once a year) 2. Pneumonia vaccine #1 --> please schedule a nurse visit in 2 months to get your second dose. Then you are good for 5 years.    Please come back to see Colletta [place] in 4 months - we can do labs at the visit if we need to.

## 2019-08-14 LAB — T-HELPER CELL (CD4) - (RCID CLINIC ONLY)
CD4 % Helper T Cell: 8 % — ABNORMAL LOW (ref 33–65)
CD4 T Cell Abs: 221 /uL — ABNORMAL LOW (ref 400–1790)

## 2019-08-14 MED ORDER — FERROUS SULFATE 325 (65 FE) MG PO TABS: 325.0000 mg | ORAL_TABLET | ORAL | 3 refills | Status: DC

## 2019-08-14 NOTE — Assessment & Plan Note (Addendum)
Flu shot and prevnar given today. Pneumovax and menveo #1 at upcoming visit.

## 2019-08-14 NOTE — Progress Notes (Signed)
Decrease iron to 3x / week for one more month then stop - will recheck levels at next lab draw off supplementation Encouraged eggs, red mead, leafy green veggies to get good natural sources.   CD4 > 200 for 3 months - stop bactrim and continue on only Biktarvy.   MyChart was sent regarding the above.

## 2019-08-14 NOTE — Assessment & Plan Note (Signed)
Will repeat Hgb and iron panel today. Hopeful we can drop back to 3x a week then stop and recheck  My only concern with his diet plan would be decreased iron intake. Discussed non-meat sources including certain beans, eggs, leafy green vegetables. Even cooking in cast iron skillet would be helpful for him.

## 2019-08-14 NOTE — Assessment & Plan Note (Signed)
He has been doing very well with adherence. Since starting his [place] he has been undetectable. Will update labs today and have him return in 4 months.  Vaccines updated.  Hopefully we can stop bactrim if CD4 > 200 this time.

## 2019-08-19 LAB — IRON,TIBC AND FERRITIN PANEL
%SAT: 19 % (calc) — ABNORMAL LOW (ref 20–48)
Ferritin: 299 ng/mL (ref 38–380)
Iron: 60 ug/dL (ref 50–180)
TIBC: 309 mcg/dL (calc) (ref 250–425)

## 2019-08-19 LAB — HIV-1 RNA QUANT-NO REFLEX-BLD
HIV 1 RNA Quant: <20 copies/mL
HIV-1 RNA Quant, Log: <1.3 Log copies/mL

## 2019-08-19 LAB — HEMOGLOBIN: Hemoglobin: 13.1 g/dL — ABNORMAL LOW (ref 13.2–17.1)

## 2019-09-30 ENCOUNTER — Ambulatory Visit (INDEPENDENT_AMBULATORY_CARE_PROVIDER_SITE_OTHER): Payer: Self-pay | Admitting: Family Medicine

## 2019-09-30 ENCOUNTER — Other Ambulatory Visit: Payer: Self-pay

## 2019-09-30 ENCOUNTER — Encounter: Payer: Self-pay | Admitting: Family Medicine

## 2019-09-30 VITALS — BP 123/60 | HR 70 | Temp 98.0°F | Ht 69.0 in | Wt 270.8 lb

## 2019-09-30 DIAGNOSIS — Z0000 Encounter for general adult medical examination without abnormal findings: Secondary | ICD-10-CM

## 2019-09-30 DIAGNOSIS — E66812 Obesity, class 2: Secondary | ICD-10-CM

## 2019-09-30 DIAGNOSIS — J302 Other seasonal allergic rhinitis: Secondary | ICD-10-CM | POA: Insufficient documentation

## 2019-09-30 DIAGNOSIS — Z6841 Body Mass Index (BMI) 40.0 and over, adult: Secondary | ICD-10-CM | POA: Insufficient documentation

## 2019-09-30 DIAGNOSIS — Z21 Asymptomatic human immunodeficiency virus [HIV] infection status: Secondary | ICD-10-CM

## 2019-09-30 DIAGNOSIS — Z09 Encounter for follow-up examination after completed treatment for conditions other than malignant neoplasm: Secondary | ICD-10-CM

## 2019-09-30 DIAGNOSIS — Z6839 Body mass index (BMI) 39.0-39.9, adult: Secondary | ICD-10-CM

## 2019-09-30 DIAGNOSIS — Z6837 Body mass index (BMI) 37.0-37.9, adult: Secondary | ICD-10-CM | POA: Insufficient documentation

## 2019-09-30 DIAGNOSIS — R635 Abnormal weight gain: Secondary | ICD-10-CM

## 2019-09-30 HISTORY — DX: Other seasonal allergic rhinitis: J30.2

## 2019-09-30 LAB — POCT URINALYSIS DIPSTICK
Bilirubin, UA: NEGATIVE
Blood, UA: NEGATIVE
Glucose, UA: NEGATIVE
Ketones, UA: NEGATIVE
Leukocytes, UA: NEGATIVE
Nitrite, UA: NEGATIVE
Protein, UA: NEGATIVE
Spec Grav, UA: 1.025 (ref 1.010–1.025)
Urobilinogen, UA: 0.2 E.U./dL
pH, UA: 6.5 (ref 5.0–8.0)

## 2019-09-30 MED ORDER — CETIRIZINE HCL 10 MG PO TABS: 10.0000 mg | ORAL_TABLET | Freq: Every day | ORAL | 11 refills | Status: DC

## 2019-09-30 NOTE — Patient Instructions (Addendum)
Calorie Counting for Weight Loss Calories are units of energy. Your body needs a certain amount of calories from food to keep you going throughout the day. When you eat more calories than your body needs, your body stores the extra calories as fat. When you eat fewer calories than your body needs, your body burns fat to get the energy it needs. Calorie counting means keeping track of how many calories you eat and drink each day. Calorie counting can be helpful if you need to lose weight. If you make sure to eat fewer calories than your body needs, you should lose weight. Ask your health care provider what a healthy weight is for you. For calorie counting to work, you will need to eat the right number of calories in a day in order to lose a healthy amount of weight per week. A dietitian can help you determine how many calories you need in a day and will give you suggestions on how to reach your calorie goal.  A healthy amount of weight to lose per week is usually 1-2 lb (0.5-0.9 kg). This usually means that your daily calorie intake should be reduced by 500-750 calories.  Eating 1,200 - 1,500 calories per day can help most women lose weight.  Eating 1,500 - 1,800 calories per day can help most men lose weight. What is my plan? My goal is to have __________ calories per day. If I have this many calories per day, I should lose around __________ pounds per week. What do I need to know about calorie counting? In order to meet your daily calorie goal, you will need to:  Find out how many calories are in each food you would like to eat. Try to do this before you eat.  Decide how much of the food you plan to eat.  Write down what you ate and how many calories it had. Doing this is called keeping a food log. To successfully lose weight, it is important to balance calorie counting with a healthy lifestyle that includes regular activity. Aim for 150 minutes of moderate exercise (such as walking) or 75  minutes of vigorous exercise (such as running) each week. Where do I find calorie information?  The number of calories in a food can be found on a Nutrition Facts label. If a food does not have a Nutrition Facts label, try to look up the calories online or ask your dietitian for help. Remember that calories are listed per serving. If you choose to have more than one serving of a food, you will have to multiply the calories per serving by the amount of servings you plan to eat. For example, the label on a package of bread might say that a serving size is 1 slice and that there are 90 calories in a serving. If you eat 1 slice, you will have eaten 90 calories. If you eat 2 slices, you will have eaten 180 calories. How do I keep a food log? Immediately after each meal, record the following information in your food log:  What you ate. Don't forget to include toppings, sauces, and other extras on the food.  How much you ate. This can be measured in cups, ounces, or number of items.  How many calories each food and drink had.  The total number of calories in the meal. Keep your food log near you, such as in a small notebook in your pocket, or use a mobile app or website. Some programs will calculate   calories for you and show you how many calories you have left for the day to meet your goal. What are some calorie counting tips?   Use your calories on foods and drinks that will fill you up and not leave you hungry: ? Some examples of foods that fill you up are nuts and nut butters, vegetables, lean proteins, and high-fiber foods like whole grains. High-fiber foods are foods with more than 5 g fiber per serving. ? Drinks such as sodas, specialty coffee drinks, alcohol, and juices have a lot of calories, yet do not fill you up.  Eat nutritious foods and avoid empty calories. Empty calories are calories you get from foods or beverages that do not have many vitamins or protein, such as candy, sweets, and  soda. It is better to have a nutritious high-calorie food (such as an avocado) than a food with few nutrients (such as a bag of chips).  Know how many calories are in the foods you eat most often. This will help you calculate calorie counts faster.  Pay attention to calories in drinks. Low-calorie drinks include water and unsweetened drinks.  Pay attention to nutrition labels for "low fat" or "fat free" foods. These foods sometimes have the same amount of calories or more calories than the full fat versions. They also often have added sugar, starch, or salt, to make up for flavor that was removed with the fat.  Find a way of tracking calories that works for you. Get creative. Try different apps or programs if writing down calories does not work for you. What are some portion control tips?  Know how many calories are in a serving. This will help you know how many servings of a certain food you can have.  Use a measuring cup to measure serving sizes. You could also try weighing out portions on a kitchen scale. With time, you will be able to estimate serving sizes for some foods.  Take some time to put servings of different foods on your favorite plates, bowls, and cups so you know what a serving looks like.  Try not to eat straight from a bag or box. Doing this can lead to overeating. Put the amount you would like to eat in a cup or on a plate to make sure you are eating the right portion.  Use smaller plates, glasses, and bowls to prevent overeating.  Try not to multitask (for example, watch TV or use your computer) while eating. If it is time to eat, sit down at a table and enjoy your food. This will help you to know when you are full. It will also help you to be aware of what you are eating and how much you are eating. What are tips for following this plan? Reading food labels  Check the calorie count compared to the serving size. The serving size may be smaller than what you are used to  eating.  Check the source of the calories. Make sure the food you are eating is high in vitamins and protein and low in saturated and trans fats. Shopping  Read nutrition labels while you shop. This will help you make healthy decisions before you decide to purchase your food.  Make a grocery list and stick to it. Cooking  Try to cook your favorite foods in a healthier way. For example, try baking instead of frying.  Use low-fat dairy products. Meal planning  Use more fruits and vegetables. Half of your plate should be fruits   and vegetables.  Include lean proteins like poultry and fish. How do I count calories when eating out?  Ask for smaller portion sizes.  Consider sharing an entree and sides instead of getting your own entree.  If you get your own entree, eat only half. Ask for a box at the beginning of your meal and put the rest of your entree in it so you are not tempted to eat it.  If calories are listed on the menu, choose the lower calorie options.  Choose dishes that include vegetables, fruits, whole grains, low-fat dairy products, and lean protein.  Choose items that are boiled, broiled, grilled, or steamed. Stay away from items that are buttered, battered, fried, or served with cream sauce. Items labeled "crispy" are usually fried, unless stated otherwise.  Choose water, low-fat milk, unsweetened iced tea, or other drinks without added sugar. If you want an alcoholic beverage, choose a lower calorie option such as a glass of wine or light beer.  Ask for dressings, sauces, and syrups on the side. These are usually high in calories, so you should limit the amount you eat.  If you want a salad, choose a garden salad and ask for grilled meats. Avoid extra toppings like bacon, cheese, or fried items. Ask for the dressing on the side, or ask for olive oil and vinegar or lemon to use as dressing.  Estimate how many servings of a food you are given. For example, a serving of  cooked rice is  cup or about the size of half a baseball. Knowing serving sizes will help you be aware of how much food you are eating at restaurants. The list below tells you how big or small some common portion sizes are based on everyday objects: ? 1 oz-4 stacked dice. ? 3 oz-1 deck of cards. ? 1 tsp-1 die. ? 1 Tbsp- a ping-pong ball. ? 2 Tbsp-1 ping-pong ball. ?  cup- baseball. ? 1 cup-1 baseball. Summary  Calorie counting means keeping track of how many calories you eat and drink each day. If you eat fewer calories than your body needs, you should lose weight.  A healthy amount of weight to lose per week is usually 1-2 lb (0.5-0.9 kg). This usually means reducing your daily calorie intake by 500-750 calories.  The number of calories in a food can be found on a Nutrition Facts label. If a food does not have a Nutrition Facts label, try to look up the calories online or ask your dietitian for help.  Use your calories on foods and drinks that will fill you up, and not on foods and drinks that will leave you hungry.  Use smaller plates, glasses, and bowls to prevent overeating. This information is not intended to replace advice given to you by your health care provider. Make sure you discuss any questions you have with your health care provider. Document Released: 11/05/2005 Document Revised: 07/25/2018 Document Reviewed: 10/05/2016 Elsevier Patient Education  2020 [place] DASH stands for "Dietary Approaches to Stop Hypertension." The DASH eating plan is a healthy eating plan that has been shown to reduce high blood pressure (hypertension). It may also reduce your risk for type 2 diabetes, heart disease, and stroke. The DASH eating plan may also help with weight loss. What are tips for following this plan?  General guidelines  Avoid eating more than 2,300 mg (milligrams) of salt (sodium) a day. If you have hypertension, you may need to  reduce your  sodium intake to 1,500 mg a day.  Limit alcohol intake to no more than 1 drink a day for nonpregnant women and 2 drinks a day for men. One drink equals 12 oz of beer, 5 oz of wine, or 1 oz of hard liquor.  Work with your health care provider to maintain a healthy body weight or to lose weight. Ask what an ideal weight is for you.  Get at least 30 minutes of exercise that causes your heart to beat faster (aerobic exercise) most days of the week. Activities may include walking, swimming, or biking.  Work with your health care provider or diet and nutrition specialist (dietitian) to adjust your eating plan to your individual calorie needs. Reading food labels   Check food labels for the amount of sodium per serving. Choose foods with less than 5 percent of the Daily Value of sodium. Generally, foods with less than 300 mg of sodium per serving fit into this eating plan.  To find whole grains, look for the word "whole" as the first word in the ingredient list. Shopping  Buy products labeled as "low-sodium" or "no salt added."  Buy fresh foods. Avoid canned foods and premade or frozen meals. Cooking  Avoid adding salt when cooking. Use salt-free seasonings or herbs instead of table salt or sea salt. Check with your health care provider or pharmacist before using salt substitutes.  Do not fry foods. Cook foods using healthy methods such as baking, boiling, grilling, and broiling instead.  Cook with heart-healthy oils, such as olive, canola, soybean, or sunflower oil. Meal planning  Eat a balanced diet that includes: ? 5 or more servings of fruits and vegetables each day. At each meal, try to fill half of your plate with fruits and vegetables. ? Up to 6-8 servings of whole grains each day. ? Less than 6 oz of lean meat, poultry, or fish each day. A 3-oz serving of meat is about the same size as a deck of cards. One egg equals 1 oz. ? 2 servings of low-fat dairy each day. ? A serving of  nuts, seeds, or beans 5 times each week. ? Heart-healthy fats. Healthy fats called Omega-3 fatty acids are found in foods such as flaxseeds and coldwater fish, like sardines, salmon, and mackerel.  Limit how much you eat of the following: ? Canned or prepackaged foods. ? Food that is high in trans fat, such as fried foods. ? Food that is high in saturated fat, such as fatty meat. ? Sweets, desserts, sugary drinks, and other foods with added sugar. ? Full-fat dairy products.  Do not salt foods before eating.  Try to eat at least 2 vegetarian meals each week.  Eat more home-cooked food and less restaurant, buffet, and fast food.  When eating at a restaurant, ask that your food be prepared with less salt or no salt, if possible. What foods are recommended? The items listed may not be a complete list. Talk with your dietitian about what dietary choices are best for you. Grains Whole-grain or whole-wheat bread. Whole-grain or whole-wheat pasta. Brown rice. [place] Cobbatmeal. Quinoa. Bulgur. Whole-grain and low-sodium cereals. Pita bread. Low-fat, low-sodium crackers. Whole-wheat flour tortillas. Vegetables Fresh or frozen vegetables (raw, steamed, roasted, or grilled). Low-sodium or reduced-sodium tomato and vegetable juice. Low-sodium or reduced-sodium tomato sauce and tomato paste. Low-sodium or reduced-sodium canned vegetables. Fruits All fresh, dried, or frozen fruit. Canned fruit in natural juice (without added sugar). Meat and other protein foods Skinless  chicken or [place]. Ground chicken or [place]. Pork with fat trimmed off. Fish and seafood. Egg whites. Dried beans, peas, or lentils. Unsalted nuts, nut butters, and seeds. Unsalted canned beans. Lean cuts of beef with fat trimmed off. Low-sodium, lean deli meat. Dairy Low-fat (1%) or fat-free (skim) milk. Fat-free, low-fat, or reduced-fat cheeses. Nonfat, low-sodium ricotta or cottage cheese. Low-fat or nonfat yogurt. Low-fat, low-sodium cheese.  Fats and oils Soft margarine without trans fats. Vegetable oil. Low-fat, reduced-fat, or light mayonnaise and salad dressings (reduced-sodium). Canola, safflower, olive, soybean, and sunflower oils. Avocado. Seasoning and other foods Herbs. Spices. Seasoning mixes without salt. Unsalted popcorn and pretzels. Fat-free sweets. What foods are not recommended? The items listed may not be a complete list. Talk with your dietitian about what dietary choices are best for you. Grains Baked goods made with fat, such as croissants, muffins, or some breads. Dry pasta or rice meal packs. Vegetables Creamed or fried vegetables. Vegetables in a cheese sauce. Regular canned vegetables (not low-sodium or reduced-sodium). Regular canned tomato sauce and paste (not low-sodium or reduced-sodium). Regular tomato and vegetable juice (not low-sodium or reduced-sodium). Rosita Fire. Olives. Fruits Canned fruit in a light or heavy syrup. Fried fruit. Fruit in cream or butter sauce. Meat and other protein foods Fatty cuts of meat. Ribs. Fried meat. Tomasa Blase. Sausage. Bologna and other processed lunch meats. Salami. Fatback. Hotdogs. Bratwurst. Salted nuts and seeds. Canned beans with added salt. Canned or smoked fish. Whole eggs or egg yolks. Chicken or [place] with skin. Dairy Whole or 2% milk, cream, and half-and-half. Whole or full-fat cream cheese. Whole-fat or sweetened yogurt. Full-fat cheese. Nondairy creamers. Whipped toppings. Processed cheese and cheese spreads. Fats and oils Butter. Stick margarine. Lard. Shortening. Ghee. Bacon fat. Tropical oils, such as coconut, palm kernel, or palm oil. Seasoning and other foods Salted popcorn and pretzels. Onion salt, garlic salt, seasoned salt, table salt, and sea salt. Worcestershire sauce. Tartar sauce. Barbecue sauce. Teriyaki sauce. Soy sauce, including reduced-sodium. Steak sauce. Canned and packaged gravies. Fish sauce. Oyster sauce. Cocktail sauce. Horseradish that you  find on the shelf. Ketchup. Mustard. Meat flavorings and tenderizers. Bouillon cubes. Hot sauce and Tabasco sauce. Premade or packaged marinades. Premade or packaged taco seasonings. Relishes. Regular salad dressings. Where to find more information:  National Heart, Lung, and Blood Institute: PopSteam.is  American Heart Association: www.heart.org Summary  The DASH eating plan is a healthy eating plan that has been shown to reduce high blood pressure (hypertension). It may also reduce your risk for type 2 diabetes, heart disease, and stroke.  With the DASH eating plan, you should limit salt (sodium) intake to 2,300 mg a day. If you have hypertension, you may need to reduce your sodium intake to 1,500 mg a day.  When on the DASH eating plan, aim to eat more fresh fruits and vegetables, whole grains, lean proteins, low-fat dairy, and heart-healthy fats.  Work with your health care provider or diet and nutrition specialist (dietitian) to adjust your eating plan to your individual calorie needs. This information is not intended to replace advice given to you by your health care provider. Make sure you discuss any questions you have with your health care provider. Document Released: 10/25/2011 Document Revised: 10/18/2017 Document Reviewed: 10/29/2016 Elsevier Patient Education  2020 Else[employer] Inc.    Allergies, Adult An allergy means that your body reacts to something that bothers it (allergen). It is not a normal reaction. This can happen from something that you:  Eat.  Breathe in.  Touch. You can have an allergy (be allergic) to:  Outdoor things, like: ? Pollen. ? Grass. ? Weeds.  Indoor things, like: ? Dust. ? Smoke. ? Pet dander.  Foods.  Medicines.  Things that bother your skin, like: ? Detergents. ? Chemicals. ? Latex.  Perfume.  Bugs. An allergy cannot spread from person to person (is not contagious). Follow these instructions at home:         Stay  away from things that you know you are allergic to.  If you have allergies to things in the air, wash out your nose each day. Do it with one of these: ? A salt-water (saline) spray. ? A container (neti pot).  Take over-the-counter and prescription medicines only as told by your doctor.  Keep all follow-up visits as told by your doctor. This is important.  If you are at risk for a very bad allergy reaction (anaphylaxis), keep an auto-injector with you all the time. This is called an epinephrine injection. ? This is pre-measured medicine with a needle. You can put it into your skin by yourself. ? Right after you have a very bad allergy reaction, you or a person with you must give the medicine in less than a few minutes. This is an emergency.  If you have ever had a very bad allergy reaction, wear a medical alert bracelet or necklace. Your very bad allergy should be written on it. Contact a health care provider if:  Your symptoms do not get better with treatment. Get help right away if:  You have symptoms of a very bad allergy reaction. These include: ? A swollen mouth, tongue, or throat. ? Pain or tightness in your chest. ? Trouble breathing. ? Being short of breath. ? Dizziness. ? Fainting. ? Very bad pain in your belly (abdomen). ? Throwing up (vomiting). ? Watery poop (diarrhea). Summary  An allergy means that your body reacts to something that bothers it (allergen). It is not a normal reaction.  Stay away from things that make your body react.  Take over-the-counter and prescription medicines only as told by your doctor.  If you are at risk for a very bad allergy reaction, carry an auto-injector (epinephrine injection) all the time. Also, wear a medical alert bracelet or necklace so people know about your allergy. This information is not intended to replace advice given to you by your health care provider. Make sure you discuss any questions you have with your health care  provider. Document Released: 03/02/2013 Document Revised: 02/24/2019 Document Reviewed: 02/18/2017 Elsevier Patient Education  2020 Else[employer] Inc. Cetirizine tablets What is this medicine? CETIRIZINE (se TI ra zeen) is an antihistamine. This medicine is used to treat or prevent symptoms of allergies. It is also used to help reduce itchy skin rash and hives. This medicine may be used for other purposes; ask your health care provider or pharmacist if you have questions. COMMON BRAND NAME(S): All Day Allergy, Allergy Relief, Zyrtec, Zyrtec Hives Relief What should I tell my health care provider before I take this medicine? They need to know if you have any of these conditions:  kidney disease  liver disease  an unusual or allergic reaction to cetirizine, hydroxyzine, other medicines, foods, dyes, or preservatives  pregnant or trying to get pregnant  breast-feeding How should I use this medicine? Take this medicine by mouth with a glass of water. Follow the directions on the prescription label. You can take this medicine with food or on an empty stomach. Take  your medicine at regular times. Do not take more often than directed. You may need to take this medicine for several days before your symptoms improve. Talk to your pediatrician regarding the use of this medicine in children. Special care may be needed. While this drug may be prescribed for children as young as 38 years of age for selected conditions, precautions do apply. Overdosage: If you think you have taken too much of this medicine contact a poison control center or emergency room at once. NOTE: This medicine is only for you. Do not share this medicine with others. What if I miss a dose? If you miss a dose, take it as soon as you can. If it is almost time for your next dose, take only that dose. Do not take double or extra doses. What may interact with this medicine?  alcohol  certain medicines for anxiety or sleep  narcotic  medicines for pain  other medicines for colds or allergies This list may not describe all possible interactions. Give your health care provider a list of all the medicines, herbs, non-prescription drugs, or dietary supplements you use. Also tell them if you smoke, drink alcohol, or use illegal drugs. Some items may interact with your medicine. What should I watch for while using this medicine? Visit your doctor or health care professional for regular checks on your health. Tell your doctor if your symptoms do not improve. You may get drowsy or dizzy. Do not drive, use machinery, or do anything that needs mental alertness until you know how this medicine affects you. Do not stand or sit up quickly, especially if you are an older patient. This reduces the risk of dizzy or fainting spells. Your mouth may get dry. Chewing sugarless gum or sucking hard candy, and drinking plenty of water may help. Contact your doctor if the problem does not go away or is severe. What side effects may I notice from receiving this medicine? Side effects that you should report to your doctor or health care professional as soon as possible:  allergic reactions like skin rash, itching or hives, swelling of the face, lips, or tongue  changes in vision or hearing  fast or irregular heartbeat  trouble passing urine or change in the amount of urine Side effects that usually do not require medical attention (report to your doctor or health care professional if they continue or are bothersome):  dizziness  dry mouth  irritability  sore throat  stomach pain  tiredness This list may not describe all possible side effects. Call your doctor for medical advice about side effects. You may report side effects to FDA at 1-800-FDA-1088. Where should I keep my medicine? Keep out of the reach of children. Store at room temperature between 15 and 30 degrees C (59 and 86 degrees F). Throw away any unused medicine after the  expiration date. NOTE: This sheet is a summary. It may not cover all possible information. If you have questions about this medicine, talk to your doctor, pharmacist, or health care provider.  2020 Elsevier/Gold Standard (2014-11-30 13:44:42)

## 2019-09-30 NOTE — Progress Notes (Signed)
[facility]   Established Patient Office Visit  Subjective:  Patient ID: Alex Mitchell, male    DOB: 1981/09/22  Age: 38 y.o. MRN: [id number]  CC: No chief complaint on file.   HPI Alex Mitchell is a 22 male who presents for Follow Up today.   Past Medical History:  Diagnosis Date  . AIDS (acquired immune deficiency syndrome) (HCC) 10/31/2018  . [place]   . Mouth sores    Current Status: Since his last office visit, he is doing well with no complaints. He continues to follow up with Infection Disease today. His is concerned about his recent weight gain and would like suggestions towards weight loss. He denies fevers, chills, fatigue, recent infections, weight loss, and night sweats. He has not had any headaches, visual changes, dizziness, and falls. No chest pain, heart palpitations, cough and shortness of breath reported. No reports of GI problems such as nausea, vomiting, diarrhea, and constipation. He has no reports of blood in stools, dysuria and hematuria. His anxiety is moderated today r/t his health status. He denies suicidal ideations, homicidal ideations, or auditory hallucinations. He denies pain today.    History reviewed. No pertinent surgical history.  Family History  Problem Relation Age of Onset  . Hypertension Other   . Hypertension Father   . Diabetes Neg Hx     Social History   Socioeconomic History  . Marital status: Single    Spouse name: Not on file  . Number of children: Not on file  . Years of education: Not on file  . Highest education level: Not on file  Occupational History  . Not on file  Social Needs  . Financial resource strain: Not on file  . Food insecurity    Worry: Not on file    Inability: Not on file  . Transportation needs    Medical: Not on file    Non-medical: Not on file  Tobacco Use  . Smoking status: Never Smoker  . Smokeless tobacco: Never Used  Substance and Sexual  Activity  . Alcohol use: Not Currently  . Drug use: No  . Sexual activity: Not Currently  Lifestyle  . Physical activity    Days per week: Not on file    Minutes per session: Not on file  . Stress: Not on file  Relationships  . Social Musician on phone: Not on file    Gets together: Not on file    Attends religious service: Not on file    Active member of club or organization: Not on file    Attends meetings of clubs or organizations: Not on file    Relationship status: Not on file  . Intimate partner violence    Fear of current or ex partner: Not on file    Emotionally abused: Not on file    Physically abused: Not on file    Forced sexual activity: Not on file  Other Topics Concern  . Not on file  Social History Narrative  . Not on file    Outpatient Medications Prior to Visit  Medication Sig Dispense Refill  . bictegravir-emtricitabine-tenofovir AF (BIKTARVY) 50-200-25 MG TABS tablet Take 1 tablet by mouth daily. 30 tablet 5  . ferrous sulfate 325 (65 FE) MG tablet Take 1 tablet (325 mg total) by mouth 3 (three) times a week. 15 tablet 3   No facility-administered medications prior to visit.     No Known Allergies  ROS Review of Systems  Constitutional: Negative.   HENT: Negative.   Eyes: Negative.   Respiratory: Negative.        Morning congestion  Cardiovascular: Negative.   Gastrointestinal: Negative.   Endocrine: Negative.   Genitourinary: Negative.   Musculoskeletal: Negative.   Skin: Negative.   Allergic/Immunologic: Negative.        Seasonal allergies   Neurological: Negative.   Hematological: Negative.   Psychiatric/Behavioral: Negative.    Objective:    Physical Exam  Constitutional: He is oriented to person, place, and time. He appears well-developed and well-nourished.  HENT:  Head: Normocephalic and atraumatic.  Eyes: Conjunctivae are normal.  Neck: Normal range of motion. Neck supple.  Cardiovascular: Normal rate, regular  rhythm, normal heart sounds and intact distal pulses.  Pulmonary/Chest: Effort normal and breath sounds normal.  Abdominal: Soft. Bowel sounds are normal. He exhibits distension (obese).  Musculoskeletal: Normal range of motion.  Neurological: He is alert and oriented to person, place, and time. He has normal reflexes.  Skin: Skin is warm and dry.  Psychiatric: He has a normal mood and affect. His behavior is normal. Judgment and thought content normal.  Nursing note and vitals reviewed.   BP 123/60   Pulse 70   Temp 98 F (36.7 C) (Oral)   Ht 5\' 9"  (1.753 m)   Wt 270 lb 12.8 oz (122.8 kg)   SpO2 100%   BMI 39.99 kg/m  Wt Readings from Last 3 Encounters:  09/30/19 270 lb 12.8 oz (122.8 kg)  05/13/19 230 lb (104.3 kg)  01/27/19 239 lb (108.4 kg)     There are no preventive care reminders to display for this patient.  There are no preventive care reminders to display for this patient.  Lab Results  Component Value Date   TSH 1.030 10/21/2018   Lab Results  Component Value Date   WBC 3.1 (L) 01/27/2019   HGB 13.1 (L) 08/13/2019   HCT 31.1 (L) 05/13/2019   MCV 91.6 01/27/2019   PLT 256 01/27/2019   Lab Results  Component Value Date   NA 138 11/24/2018   K 4.2 11/24/2018   CO2 25 11/24/2018   GLUCOSE 89 11/24/2018   BUN 5 (L) 11/24/2018   CREATININE 1.13 11/24/2018   BILITOT 0.3 11/24/2018   ALKPHOS 132 (H) 10/21/2018   AST 24 11/24/2018   ALT 20 11/24/2018   PROT 6.6 11/24/2018   ALBUMIN 2.6 (L) 10/21/2018   CALCIUM 9.7 11/24/2018   ANIONGAP 11 10/29/2018   No results found for: CHOL No results found for: HDL No results found for: LDLCALC No results found for: TRIG No results found for: [facility] Lab Results  Component Value Date   HGBA1C 5.4 11/05/2018      Assessment & Plan:   1. Weight gain He has gained 40 lb in 6 months.   2. Class 2 severe obesity due to excess calories with serious comorbidity and body mass index (BMI) of 39.0 to 39.9 in  adult [facility](HCC) Body mass index is 39.99 kg/m. Goal BMI  is <30. Encouraged efforts to reduce weight include engaging in physical activity as tolerated with goal of 150 minutes per week. Improve dietary choices and eat a meal regimen consistent with a Mediterranean or DASH diet. Reduce simple carbohydrates. Do not skip meals and eat healthy snacks throughout the day to avoid over-eating at dinner. Set a goal weight loss that is achievable for you.  3. Asymptomatic HIV infection (HCC)  4. Seasonal allergies -  cetirizine (ZYRTEC) 10 MG tablet; Take 1 tablet (10 mg total) by mouth daily.  Dispense: 30 tablet; Refill: 11  5. Healthcare maintenance - Urinalysis Dipstick  6. Follow up He will follow up in 3 months.   Meds ordered this encounter  Medications  . cetirizine (ZYRTEC) 10 MG tablet    Sig: Take 1 tablet (10 mg total) by mouth daily.    Dispense:  30 tablet    Refill:  11    Problem List Items Addressed This Visit      Other   Healthcare maintenance   Relevant Orders   Urinalysis Dipstick (Completed)    Other Visit Diagnoses    Weight gain    -  Primary   Class 2 severe obesity due to excess calories with serious comorbidity and body mass index (BMI) of 39.0 to 39.9 in adult [facility])       Asymptomatic HIV infection (HCC)       Seasonal allergies       Relevant Medications   cetirizine (ZYRTEC) 10 MG tablet   Follow up          Meds ordered this encounter  Medications  . cetirizine (ZYRTEC) 10 MG tablet    Sig: Take 1 tablet (10 mg total) by mouth daily.    Dispense:  30 tablet    Refill:  11    Follow-up: Return in about 3 months (around 12/31/2019).    Azzie Glatter, FNP

## 2019-10-13 ENCOUNTER — Ambulatory Visit (INDEPENDENT_AMBULATORY_CARE_PROVIDER_SITE_OTHER): Payer: Self-pay

## 2019-10-13 ENCOUNTER — Other Ambulatory Visit: Payer: Self-pay

## 2019-10-13 DIAGNOSIS — Z23 Encounter for immunization: Secondary | ICD-10-CM

## 2019-10-13 NOTE — Progress Notes (Signed)
Patient is here for 2nd dose of Prevanar 13. Vaccine given left deltoid.   Laverle Patter, RN

## 2019-12-07 ENCOUNTER — Other Ambulatory Visit: Payer: Self-pay

## 2019-12-07 ENCOUNTER — Encounter: Payer: Self-pay | Admitting: Infectious Diseases

## 2019-12-07 ENCOUNTER — Ambulatory Visit (INDEPENDENT_AMBULATORY_CARE_PROVIDER_SITE_OTHER): Payer: Self-pay | Admitting: Infectious Diseases

## 2019-12-07 ENCOUNTER — Ambulatory Visit: Payer: Medicaid Other

## 2019-12-07 VITALS — BP 138/81 | HR 76 | Temp 97.9°F | Wt 284.0 lb

## 2019-12-07 DIAGNOSIS — D509 Iron deficiency anemia, unspecified: Secondary | ICD-10-CM

## 2019-12-07 DIAGNOSIS — Z6841 Body Mass Index (BMI) 40.0 and over, adult: Secondary | ICD-10-CM

## 2019-12-07 DIAGNOSIS — B20 Human immunodeficiency virus [HIV] disease: Secondary | ICD-10-CM

## 2019-12-07 NOTE — Assessment & Plan Note (Signed)
CD4 > 200 for > 3 months now. He has improved significantly and taking his Biktarvy correctly. No drug interactions identified. Will update labs today then can follow with q53m intervals.  Per patient no STI screening necessary today.  Vaccines up to date.  We discussed COVID19 vaccine as well and I think it would be a great idea for him to get this once available. Information provided re: [place] and [place] phased rollout.

## 2019-12-07 NOTE — Patient Instructions (Addendum)
Please continue your [place] everyday.   Will let you know how your labs.   COVID-19 Vaccine Information can be found at: [email or web address] For questions related to vaccine distribution or appointments, please email [email or web address] or call [phone].   Please schedule a video visit in 3 months so we can see how you are doing with your weight loss  Remember - we lose weight in the kitchen and tone it up in the gym! Nutrition has to be first.

## 2019-12-07 NOTE — Assessment & Plan Note (Signed)
Has been improving - Will repeat CBC off iron supplements.

## 2019-12-07 NOTE — Progress Notes (Signed)
Name: Alex Mitchell  DOB: 1980-12-10 MRN: [id number] PCP: Azzie Glatter, FNP    Patient Active Problem List   Diagnosis Date Noted  . BMI 40.0-44.9, adult ([place]) 09/30/2019  . Class 2 severe obesity due to excess calories with serious comorbidity and body mass index (BMI) of 39.0 to 39.9 in adult [facility]) 09/30/2019  . Seasonal allergies 09/30/2019  . Iron deficiency anemia 05/13/2019  . 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 Healthcare maintenance 10/31/2018  . HIV disease ([place]) 10/31/2018  . Leukopenia 10/21/2018  . Proteinuria 10/21/2018  . Elevated LFTs 10/21/2018     Brief Narrative:  Alex Mitchell is a 39 y.o. 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  History of OIs: PJP pna, esophageal candidiasis   Previous Regimens: . Biktarvy 09-2018   Genotypes: . Not performed in hospital   Subjective:  CC:  HIV follow-up care.  Concerned about weight gain.   HPI:  [place] doing very well since her last office visit.  He has had no changes to his health aside from concern over weight gain.  He takes his Biktarvy every day without any missed doses or concern for side effects.  He has reenrolled for his HMAP for the next interval.  In review of his weights he has been 54 pounds since June 2020.  We did a quick dietary recall and it sounds like he is eating a lot of processed foods with a priority for some of the sweet things.  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  He also feels like he eats too late in the evening which contributes.  He is not currently working or doing any formal exercise although he enjoys walking outside.  He has started a program called "better me" which is an app on his phone that helps him with workouts and what to eat.  He in the past was contemplating going vegetarian but was not successful as he was always hungry.  He wants to start taking Hydroxycut but wanted to check with  me to make sure it is okay with his medication first.  He has taken this in the past and felt like it helped.  Wt Readings from Last 3 Encounters:  12/07/19 284 lb (128.8 kg)  09/30/19 270 lb 12.8 oz (122.8 kg)  05/13/19 230 lb (104.3 kg)    Review of Systems  Constitutional: Positive for unexpected weight change. Negative for appetite change, chills, fatigue and fever.  Eyes: Negative for visual disturbance.  Respiratory: Negative for cough and shortness of breath.   Cardiovascular: Negative for chest pain and leg swelling.  Gastrointestinal: Negative for abdominal pain, diarrhea and nausea.  Genitourinary: Negative for discharge, dysuria and genital sores.  Musculoskeletal: Negative for joint swelling.  Skin: Negative for color change and rash.  Neurological: Negative for dizziness and headaches.  Hematological: Negative for adenopathy.  Psychiatric/Behavioral: Negative for sleep disturbance. The patient is not nervous/anxious.       Outpatient Medications Prior to Visit  Medication Sig Dispense Refill  . bictegravir-emtricitabine-tenofovir AF (BIKTARVY) 50-200-25 MG TABS tablet Take 1 tablet by mouth daily. 30 tablet 5  . cetirizine (ZYRTEC) 10 MG tablet Take 1 tablet (10 mg total) by mouth daily. 30 tablet 11  . ferrous sulfate 325 (65 FE) MG tablet Take 1 tablet (325 mg total) by mouth 3 (three) times a week. 15 tablet 3   No facility-administered medications prior to visit.  No Known Allergies  Social History   Tobacco Use  . Smoking status: Never Smoker  . Smokeless tobacco: Never Used  Substance Use Topics  . Alcohol use: Not Currently  . Drug use: No    Social History   Substance and Sexual Activity  Sexual Activity Not Currently     Objective:   Vitals:   12/07/19 0908  BP: 138/81  Pulse: 76  Temp: 97.9 F (36.6 C)  TempSrc: Oral  Weight: 284 lb (128.8 kg)   Body mass index is 41.94 kg/m.  Physical Exam Vitals reviewed.  Constitutional:       Appearance: He is well-developed.     Comments: Seated comfortably in chair during visit.   HENT:     Mouth/Throat:     Dentition: Normal dentition. No dental abscesses.  Cardiovascular:     Rate and Rhythm: Normal rate and regular rhythm.     Heart sounds: Normal heart sounds.  Pulmonary:     Effort: Pulmonary effort is normal.     Breath sounds: Normal breath sounds.  Abdominal:     General: There is no distension.     Palpations: Abdomen is soft.     Tenderness: There is no abdominal tenderness.  Lymphadenopathy:     Cervical: No cervical adenopathy.  Skin:    General: Skin is warm and dry.     Findings: No rash.  Neurological:     Mental Status: He is alert and oriented to person, place, and time.  Psychiatric:        Judgment: Judgment normal.     Comments: In good spirits today and engaged in care discussion.      Lab Results Lab Results  Component Value Date   WBC 3.1 (L) 01/27/2019   HGB 13.1 (L) 08/13/2019   HCT 31.1 (L) 05/13/2019   MCV 91.6 01/27/2019   PLT 256 01/27/2019    Lab Results  Component Value Date   CREATININE 1.13 11/24/2018   BUN 5 (L) 11/24/2018   NA 138 11/24/2018   K 4.2 11/24/2018   CL 103 11/24/2018   CO2 25 11/24/2018    Lab Results  Component Value Date   ALT 20 11/24/2018   AST 24 11/24/2018   ALKPHOS 132 (H) 10/21/2018   BILITOT 0.3 11/24/2018    No results found for: CHOL, HDL, LDLCALC, LDLDIRECT, TRIG, CHOLHDL HIV 1 RNA Quant (copies/mL)  Date Value  08/13/2019 <20 NOT DETECTED  05/13/2019 <20 NOT DETECTED  01/27/2019 <20 NOT DETECTED   CD4 T Cell Abs (/uL)  Date Value  08/13/2019 221 (L)  05/13/2019 195 (L)  01/27/2019 60 (L)    Assessment & Plan:   Problem List Items Addressed This Visit      Unprioritized   Iron deficiency anemia    Has been improving - Will repeat CBC off iron supplements.       HIV disease (HCC) - Primary    CD4 > 200 for > 3 months now. He has improved significantly and taking  his Biktarvy correctly. No drug interactions identified. Will update labs today then can follow with q61m intervals.  Per patient no STI screening necessary today.  Vaccines up to date.  We discussed COVID19 vaccine as well and I think it would be a great idea for him to get this once available. Information provided re: [place] and [place] phased rollout.       Relevant Orders   HIV 1 RNA quant-no reflex-bld   T-helper  cell (CD4)- ([facility] clinic only)   CBC with Differential   COMPLETE METABOLIC PANEL WITH GFR   RPR   BMI 40.0-44.9, adult (HCC)    Dietary recall indicates high carbohydrate/sugar and processed foods as a majority of diet taken in through snacking. We reviewed in general macronutrients and how to prioritize protein, fiber and water to help with satiety and decreasing meals to 3 a day with a "mini meal" in the afternoon between lunch/dinner. I think he would benefit from adding some time restricted eating as well (12 hours between the last meal in the evening and first meal in the AM).  Advised Hydroxycut should be separated form Biktarvy by 6 hours but in general these are high caffeine containing supplements that do not help in the long run offer solid changes nor behavioral modification for diet.  Will check in with a video visit in 3 months. He will continue walking program and add back resistance exercise in time but focus on nutrition first.         25 minutes of direct discussion of the points outlined above.   Rexene Alberts, MSN, NP-C [facility] for Infectious [facility] Pager: [phone] Office: [phone]

## 2019-12-07 NOTE — Assessment & Plan Note (Signed)
Dietary recall indicates high carbohydrate/sugar and processed foods as a majority of diet taken in through snacking. We reviewed in general macronutrients and how to prioritize protein, fiber and water to help with satiety and decreasing meals to 3 a day with a "mini meal" in the afternoon between lunch/dinner. I think he would benefit from adding some time restricted eating as well (12 hours between the last meal in the evening and first meal in the AM).  Advised Hydroxycut should be separated form Biktarvy by 6 hours but in general these are high caffeine containing supplements that do not help in the long run offer solid changes nor behavioral modification for diet.  Will check in with a video visit in 3 months. He will continue walking program and add back resistance exercise in time but focus on nutrition first.

## 2019-12-08 LAB — T-HELPER CELL (CD4) - (RCID CLINIC ONLY)
CD4 % Helper T Cell: 11 % — ABNORMAL LOW (ref 33–65)
CD4 T Cell Abs: 317 /uL — ABNORMAL LOW (ref 400–1790)

## 2019-12-09 ENCOUNTER — Telehealth: Payer: Self-pay | Admitting: Infectious Diseases

## 2019-12-09 ENCOUNTER — Encounter: Payer: Self-pay | Admitting: Infectious Diseases

## 2019-12-09 DIAGNOSIS — R7989 Other specified abnormal findings of blood chemistry: Secondary | ICD-10-CM

## 2019-12-09 NOTE — Telephone Encounter (Signed)
Called Alex Mitchell re: significant increase in his Creatinine. Will need to go though any supplements, NSAIDS and other medications in more thorough detail to see if we can figure out why he as acute kidney injury. Needs a repeated lab and urine test Thursday if he can provide so we can get another level to trend and figure out what to do next.   Lab Results  Component Value Date   CREATININE 1.77 (H) 12/07/2019   CREATININE 1.13 11/24/2018   CREATININE 1.08 10/29/2018   Left non-specific voicemail to patient requesting call back to discuss results of recent tests.

## 2019-12-09 NOTE — Telephone Encounter (Signed)
Called back re: elevated creatinine.  He reports taking Hydroxy cut Powder supplements x 2 weeks now. No NSAIDS have been taken.   BP was normal at recent visit and no concerning signs that he has had any hypotensive episodes. He does not report any changes to the color of his urine but finds that he uses the bathroom more often now. He is off the bactrim prophylaxis. He is on Biktarvy which can cause a non-pathologic elevation in creatinine but not at a level of 0.6. He is on TAF but is not to associated with nephrotoxic risk like TDF.   Overall BUN is normal as are his electrolytes. Will repeat BMP and add urinalysis tomorrow to trend.

## 2019-12-09 NOTE — Addendum Note (Signed)
Addended by: Blanchard Kelch on: 12/09/2019 10:05 AM   Modules accepted: Orders

## 2019-12-10 ENCOUNTER — Other Ambulatory Visit: Payer: Self-pay

## 2019-12-10 ENCOUNTER — Other Ambulatory Visit: Payer: Medicaid Other

## 2019-12-10 DIAGNOSIS — R7989 Other specified abnormal findings of blood chemistry: Secondary | ICD-10-CM

## 2019-12-10 LAB — CBC WITH DIFFERENTIAL/PLATELET
Absolute Monocytes: 659 cells/uL (ref 200–950)
Basophils Absolute: 59 cells/uL (ref 0–200)
Basophils Relative: 0.8 %
Eosinophils Absolute: 1006 cells/uL — ABNORMAL HIGH (ref 15–500)
Eosinophils Relative: 13.6 %
HCT: 36.8 % — ABNORMAL LOW (ref 38.5–50.0)
Hemoglobin: 12.6 g/dL — ABNORMAL LOW (ref 13.2–17.1)
Lymphs Abs: 3049 cells/uL (ref 850–3900)
MCH: 31.8 pg (ref 27.0–33.0)
MCHC: 34.2 g/dL (ref 32.0–36.0)
MCV: 92.9 fL (ref 80.0–100.0)
MPV: 11 fL (ref 7.5–12.5)
Monocytes Relative: 8.9 %
Neutro Abs: 2627 cells/uL (ref 1500–7800)
Neutrophils Relative %: 35.5 %
Platelets: 218 10*3/uL (ref 140–400)
RBC: 3.96 10*6/uL — ABNORMAL LOW (ref 4.20–5.80)
RDW: 13.1 % (ref 11.0–15.0)
Total Lymphocyte: 41.2 %
WBC: 7.4 10*3/uL (ref 3.8–10.8)

## 2019-12-10 LAB — URINALYSIS, ROUTINE W REFLEX MICROSCOPIC
Bilirubin Urine: NEGATIVE
Glucose, UA: NEGATIVE
Hgb urine dipstick: NEGATIVE
Ketones, ur: NEGATIVE
Leukocytes,Ua: NEGATIVE
Nitrite: NEGATIVE
Protein, ur: NEGATIVE
Specific Gravity, Urine: 1.015 (ref 1.001–1.03)
pH: 6 (ref 5.0–8.0)

## 2019-12-10 LAB — COMPLETE METABOLIC PANEL WITH GFR
AG Ratio: 1.6 (calc) (ref 1.0–2.5)
ALT: 24 U/L (ref 9–46)
AST: 23 U/L (ref 10–40)
Albumin: 4.5 g/dL (ref 3.6–5.1)
Alkaline phosphatase (APISO): 71 U/L (ref 36–130)
BUN/Creatinine Ratio: 13 (calc) (ref 6–22)
BUN: 23 mg/dL (ref 7–25)
CO2: 26 mmol/L (ref 20–32)
Calcium: 9.6 mg/dL (ref 8.6–10.3)
Chloride: 105 mmol/L (ref 98–110)
Creat: 1.77 mg/dL — ABNORMAL HIGH (ref 0.60–1.35)
GFR, Est African American: 55 mL/min/{1.73_m2} — ABNORMAL LOW (ref >=60)
GFR, Est Non African American: 48 mL/min/{1.73_m2} — ABNORMAL LOW (ref >=60)
Globulin: 2.9 g/dL (calc) (ref 1.9–3.7)
Glucose, Bld: 96 mg/dL (ref 65–99)
Potassium: 4.4 mmol/L (ref 3.5–5.3)
Sodium: 138 mmol/L (ref 135–146)
Total Bilirubin: 0.5 mg/dL (ref 0.2–1.2)
Total Protein: 7.4 g/dL (ref 6.1–8.1)

## 2019-12-10 LAB — HIV-1 RNA QUANT-NO REFLEX-BLD
HIV 1 RNA Quant: <20 copies/mL — AB
HIV-1 RNA Quant, Log: <1.3 Log copies/mL — AB

## 2019-12-10 LAB — RPR: RPR Ser Ql: NONREACTIVE

## 2019-12-11 LAB — BASIC METABOLIC PANEL
BUN/Creatinine Ratio: 15 (calc) (ref 6–22)
BUN: 30 mg/dL — ABNORMAL HIGH (ref 7–25)
CO2: 27 mmol/L (ref 20–32)
Calcium: 10.1 mg/dL (ref 8.6–10.3)
Chloride: 104 mmol/L (ref 98–110)
Creat: 1.97 mg/dL — ABNORMAL HIGH (ref 0.60–1.35)
Glucose, Bld: 96 mg/dL (ref 65–99)
Potassium: 4.3 mmol/L (ref 3.5–5.3)
Sodium: 137 mmol/L (ref 135–146)

## 2019-12-11 LAB — CK: Total CK: 145 U/L (ref 44–196)

## 2019-12-21 ENCOUNTER — Encounter: Payer: Self-pay | Admitting: Infectious Diseases

## 2019-12-21 DIAGNOSIS — R7989 Other specified abnormal findings of blood chemistry: Secondary | ICD-10-CM

## 2019-12-21 NOTE — Progress Notes (Signed)
Patient ID: Alex Mitchell, male   DOB: [birth year], 39 y.o.   MRN: [id number] Pulled in error

## 2019-12-25 ENCOUNTER — Telehealth: Payer: Self-pay | Admitting: Infectious Diseases

## 2019-12-25 DIAGNOSIS — R7989 Other specified abnormal findings of blood chemistry: Secondary | ICD-10-CM

## 2019-12-25 NOTE — Telephone Encounter (Signed)
Tried to call regarding an update regarding how he is doing and request to have him return early next week for repeat blood and urine sampling.   Unable to afford [facility] - could have him seen by Dr. Leonard Schwartz in [place] as he is on [employer] discount program.   Hopefully he has stopped the Hydroxycut - there are some reports that it can induce rhabdomyolysis. Will check urine myoglobin and CK in addition to urinalysis and renal function panel.   MyChart sent to patient.

## 2019-12-28 ENCOUNTER — Other Ambulatory Visit: Payer: Medicaid Other

## 2019-12-29 ENCOUNTER — Other Ambulatory Visit: Payer: Self-pay

## 2019-12-29 DIAGNOSIS — R7989 Other specified abnormal findings of blood chemistry: Secondary | ICD-10-CM

## 2019-12-29 LAB — URINALYSIS, ROUTINE W REFLEX MICROSCOPIC
Bilirubin Urine: NEGATIVE
Glucose, UA: NEGATIVE
Hgb urine dipstick: NEGATIVE
Ketones, ur: NEGATIVE
Leukocytes,Ua: NEGATIVE
Nitrite: NEGATIVE
Protein, ur: NEGATIVE
Specific Gravity, Urine: 1.009 (ref 1.001–1.03)
pH: 6 (ref 5.0–8.0)

## 2019-12-29 LAB — RENAL FUNCTION PANEL
Albumin: 4.5 g/dL (ref 3.6–5.1)
BUN/Creatinine Ratio: 11 (calc) (ref 6–22)
BUN: 18 mg/dL (ref 7–25)
CO2: 26 mmol/L (ref 20–32)
Calcium: 9.3 mg/dL (ref 8.6–10.3)
Chloride: 106 mmol/L (ref 98–110)
Creat: 1.71 mg/dL — ABNORMAL HIGH (ref 0.60–1.35)
Glucose, Bld: 104 mg/dL — ABNORMAL HIGH (ref 65–99)
Phosphorus: 2.9 mg/dL (ref 2.5–4.5)
Potassium: 4.1 mmol/L (ref 3.5–5.3)
Sodium: 137 mmol/L (ref 135–146)

## 2019-12-29 LAB — CK: Total CK: 212 U/L — ABNORMAL HIGH (ref 44–196)

## 2019-12-30 ENCOUNTER — Encounter: Payer: Self-pay | Admitting: Family Medicine

## 2019-12-30 ENCOUNTER — Ambulatory Visit (INDEPENDENT_AMBULATORY_CARE_PROVIDER_SITE_OTHER): Payer: Self-pay | Admitting: Family Medicine

## 2019-12-30 ENCOUNTER — Other Ambulatory Visit: Payer: Self-pay

## 2019-12-30 ENCOUNTER — Ambulatory Visit: Payer: Medicaid Other | Admitting: Family Medicine

## 2019-12-30 VITALS — BP 119/63 | HR 63 | Temp 98.2°F | Ht 69.0 in | Wt 278.4 lb

## 2019-12-30 DIAGNOSIS — Z21 Asymptomatic human immunodeficiency virus [HIV] infection status: Secondary | ICD-10-CM

## 2019-12-30 DIAGNOSIS — Z09 Encounter for follow-up examination after completed treatment for conditions other than malignant neoplasm: Secondary | ICD-10-CM

## 2019-12-30 DIAGNOSIS — Z131 Encounter for screening for diabetes mellitus: Secondary | ICD-10-CM

## 2019-12-30 DIAGNOSIS — Z6841 Body Mass Index (BMI) 40.0 and over, adult: Secondary | ICD-10-CM

## 2019-12-30 DIAGNOSIS — E66813 Obesity, class 3: Secondary | ICD-10-CM

## 2019-12-30 LAB — POCT URINALYSIS DIPSTICK
Bilirubin, UA: NEGATIVE
Blood, UA: NEGATIVE
Glucose, UA: NEGATIVE
Ketones, UA: NEGATIVE
Leukocytes, UA: NEGATIVE
Nitrite, UA: NEGATIVE
Protein, UA: NEGATIVE
Spec Grav, UA: 1.02 (ref 1.010–1.025)
Urobilinogen, UA: 0.2 E.U./dL
pH, UA: 6 (ref 5.0–8.0)

## 2019-12-30 LAB — POCT GLYCOSYLATED HEMOGLOBIN (HGB A1C): Hemoglobin A1C: 5.2 % (ref 4.0–5.6)

## 2019-12-30 LAB — GLUCOSE, POCT (MANUAL RESULT ENTRY): POC Glucose: 93 mg/dl (ref 70–99)

## 2019-12-30 NOTE — Progress Notes (Signed)
Patient [facility]    Established Patient Office Visit  Subjective:  Patient ID: Alex Mitchell, male    DOB: 12-24-1980  Age: 39 y.o. MRN: [id number]  CC:  Chief Complaint  Patient presents with  . Follow-up    HPI Alex Mitchell is a 33 male who presents for Follow Up today.   Past Medical History:  Diagnosis Date  . AIDS (acquired immune deficiency syndrome) ([place]) 10/31/2018  . [place]   . Mouth sores    Current Status: Since his last office visit, he is doing well with no complaints. He continues to follow up with Infection Disease as needed. He denies fevers, chills, fatigue, recent infections, weight loss, and night sweats. He has not had any headaches, visual changes, dizziness, and falls. No chest pain, heart palpitations, cough and shortness of breath reported. No reports of GI problems such as nausea, vomiting, diarrhea, and constipation. He has no reports of blood in stools, dysuria and hematuria. No depression or anxiety reported today. He denies suicidal ideations, homicidal ideations, or auditory hallucinations. He denies pain today.   History reviewed. No pertinent surgical history.  Family History  Problem Relation Age of Onset  . Hypertension Other   . Hypertension Father   . Diabetes Neg Hx     Social History   Socioeconomic History  . Marital status: Single    Spouse name: Not on file  . Number of children: Not on file  . Years of education: Not on file  . Highest education level: Not on file  Occupational History  . Not on file  Tobacco Use  . Smoking status: Never Smoker  . Smokeless tobacco: Never Used  Substance and Sexual Activity  . Alcohol use: Not Currently  . Drug use: No  . Sexual activity: Not Currently  Other Topics Concern  . Not on file  Social History Narrative  . Not on file   Social Determinants of Health   Financial Resource Strain:   . Difficulty of Paying Living Expenses: Not on  file  Food Insecurity:   . Worried About Charity fundraiser in the Last Year: Not on file  . Ran Out of Food in the Last Year: Not on file  Transportation Needs:   . Lack of Transportation (Medical): Not on file  . Lack of Transportation (Non-Medical): Not on file  Physical Activity:   . Days of Exercise per Week: Not on file  . Minutes of Exercise per Session: Not on file  Stress:   . Feeling of Stress : Not on file  Social Connections:   . Frequency of Communication with Friends and Family: Not on file  . Frequency of Social Gatherings with Friends and Family: Not on file  . Attends Religious Services: Not on file  . Active Member of Clubs or Organizations: Not on file  . Attends Archivist Meetings: Not on file  . Marital Status: Not on file  Intimate Partner Violence:   . Fear of Current or Ex-Partner: Not on file  . Emotionally Abused: Not on file  . Physically Abused: Not on file  . Sexually Abused: Not on file    Outpatient Medications Prior to Visit  Medication Sig Dispense Refill  . bictegravir-emtricitabine-tenofovir AF (BIKTARVY) 50-200-25 MG TABS tablet Take 1 tablet by mouth daily. 30 tablet 5  . cetirizine (ZYRTEC) 10 MG tablet Take 1 tablet (10 mg total) by mouth daily. 30 tablet 11   No  facility-administered medications prior to visit.    No Known Allergies  ROS Review of Systems  Constitutional: Negative.   HENT: Negative.   Eyes: Negative.   Respiratory: Negative.   Cardiovascular: Negative.   Gastrointestinal: Negative.   Endocrine: Negative.   Genitourinary: Negative.   Musculoskeletal: Negative.   Skin: Negative.   Allergic/Immunologic: Negative.   Neurological: Negative.   Hematological: Negative.   Psychiatric/Behavioral: Negative.       Objective:    Physical Exam  Constitutional: He is oriented to person, place, and time. He appears well-developed and well-nourished.  HENT:  Head: Normocephalic and atraumatic.  Eyes:  Conjunctivae are normal.  Cardiovascular: Normal rate, regular rhythm, normal heart sounds and intact distal pulses.  Pulmonary/Chest: Effort normal and breath sounds normal.  Abdominal: Soft. Bowel sounds are normal.  Musculoskeletal:        General: Normal range of motion.     Cervical back: Normal range of motion and neck supple.  Neurological: He is alert and oriented to person, place, and time. He has normal reflexes.  Skin: Skin is warm.  Psychiatric: He has a normal mood and affect. His behavior is normal. Judgment and thought content normal.    BP 119/63   Pulse 63   Temp 98.2 F (36.8 C) (Oral)   Ht 5\' 9"  (1.753 m)   Wt 278 lb 6.4 oz (126.3 kg)   SpO2 100%   BMI 41.11 kg/m  Wt Readings from Last 3 Encounters:  12/30/19 278 lb 6.4 oz (126.3 kg)  12/07/19 284 lb (128.8 kg)  09/30/19 270 lb 12.8 oz (122.8 kg)     There are no preventive care reminders to display for this patient.  There are no preventive care reminders to display for this patient.  Lab Results  Component Value Date   TSH 1.030 10/21/2018   Lab Results  Component Value Date   WBC 7.4 12/07/2019   HGB 12.6 (L) 12/07/2019   HCT 36.8 (L) 12/07/2019   MCV 92.9 12/07/2019   PLT 218 12/07/2019   Lab Results  Component Value Date   NA 137 12/29/2019   K 4.1 12/29/2019   CO2 26 12/29/2019   GLUCOSE 104 (H) 12/29/2019   BUN 18 12/29/2019   CREATININE 1.71 (H) 12/29/2019   BILITOT 0.5 12/07/2019   ALKPHOS 132 (H) 10/21/2018   AST 23 12/07/2019   ALT 24 12/07/2019   PROT 7.4 12/07/2019   ALBUMIN 2.6 (L) 10/21/2018   CALCIUM 9.3 12/29/2019   ANIONGAP 11 10/29/2018   No results found for: CHOL No results found for: HDL No results found for: LDLCALC No results found for: TRIG No results found for: [facility] Lab Results  Component Value Date   HGBA1C 5.2 12/30/2019         Assessment & Plan:   1. Asymptomatic HIV infection (HCC) con  2. Screening for diabetes mellitus - POCT  glycosylated hemoglobin (Hb A1C) - POCT urinalysis dipstick - POCT glucose (manual entry)  3. Class 3 severe obesity due to excess calories without serious comorbidity with body mass index (BMI) of 40.0 to 44.9 in adult [facility]) Body mass index is 41.11 kg/m. Goal BMI  is <30. Encouraged efforts to reduce weight include engaging in physical activity as tolerated with goal of 150 minutes per week. Improve dietary choices and eat a meal regimen consistent with a Mediterranean or DASH diet. Reduce simple carbohydrates. Do not skip meals and eat healthy snacks throughout the day to avoid over-eating at dinner.  Set a goal weight loss that is achievable for you.  4. Follow up He will follow up  No orders of the defined types were placed in this encounter.  Orders Placed This Encounter  Procedures  . POCT glycosylated hemoglobin (Hb A1C)  . POCT urinalysis dipstick  . POCT glucose (manual entry)    Referral Orders  No referral(s) requested today    Raliegh Ip,  MSN, FNP-BC [facility] [address] [place], [place] [address] [phone]- fax  Problem List Items Addressed This Visit    None    Visit Diagnoses    Asymptomatic HIV infection (HCC)    -  Primary   Screening for diabetes mellitus       Relevant Orders   POCT glycosylated hemoglobin (Hb A1C) (Completed)   POCT urinalysis dipstick (Completed)   POCT glucose (manual entry) (Completed)   Class 3 severe obesity due to excess calories without serious comorbidity with body mass index (BMI) of 40.0 to 44.9 in adult [facility])       Follow up          No orders of the defined types were placed in this encounter.   Follow-up: No follow-ups on file.    Kallie Locks, FNP

## 2020-02-11 ENCOUNTER — Ambulatory Visit: Payer: Medicaid Other | Attending: Internal Medicine

## 2020-02-11 DIAGNOSIS — Z23 Encounter for immunization: Secondary | ICD-10-CM

## 2020-02-11 NOTE — Progress Notes (Signed)
   Covid-19 Vaccination Clinic  Name:  Alex Mitchell    MRN: [id number] DOB: [birth year]  02/11/2020  [name] was observed post Covid-19 immunization for 15 minutes without incident. He was provided with Vaccine Information Sheet and instruction to access the V-Safe system.   [name] was instructed to call 911 with any severe reactions post vaccine: Marland Kitchen Difficulty breathing  . Swelling of face and throat  . A fast heartbeat  . A bad rash all over body  . Dizziness and weakness   Immunizations Administered    Name Date Dose VIS Date Route   Pfizer COVID-19 Vaccine 02/11/2020 11:55 AM 0.3 mL 10/30/2019 Intramuscular   Manufacturer: [employer], [employer]   Lot: [id number]   NDC: [address]-3

## 2020-03-07 ENCOUNTER — Ambulatory Visit: Payer: Medicaid Other | Attending: Internal Medicine

## 2020-03-07 DIAGNOSIS — Z23 Encounter for immunization: Secondary | ICD-10-CM

## 2020-03-07 NOTE — Progress Notes (Signed)
   Covid-19 Vaccination Clinic  Name:  Alex Mitchell    MRN: [id number] DOB: [birth year]  03/07/2020  [name] was observed post Covid-19 immunization for 15 minutes without incident. He was provided with Vaccine Information Sheet and instruction to access the V-Safe system.   [name] was instructed to call 911 with any severe reactions post vaccine: Marland Kitchen Difficulty breathing  . Swelling of face and throat  . A fast heartbeat  . A bad rash all over body  . Dizziness and weakness   Immunizations Administered    Name Date Dose VIS Date Route   Pfizer COVID-19 Vaccine 03/07/2020 10:49 AM 0.3 mL 01/13/2019 Intramuscular   Manufacturer: [employer], [employer]   Lot: [id number]   NDC: [address]-9

## 2020-03-21 ENCOUNTER — Telehealth: Payer: Medicaid Other | Admitting: Infectious Diseases

## 2020-03-28 ENCOUNTER — Other Ambulatory Visit: Payer: Self-pay

## 2020-03-28 ENCOUNTER — Telehealth (INDEPENDENT_AMBULATORY_CARE_PROVIDER_SITE_OTHER): Payer: Medicaid Other | Admitting: Infectious Diseases

## 2020-03-28 ENCOUNTER — Encounter: Payer: Self-pay | Admitting: Infectious Diseases

## 2020-03-28 VITALS — Ht 69.0 in | Wt 278.0 lb

## 2020-03-28 DIAGNOSIS — Z113 Encounter for screening for infections with a predominantly sexual mode of transmission: Secondary | ICD-10-CM

## 2020-03-28 DIAGNOSIS — R7989 Other specified abnormal findings of blood chemistry: Secondary | ICD-10-CM

## 2020-03-28 DIAGNOSIS — B20 Human immunodeficiency virus [HIV] disease: Secondary | ICD-10-CM

## 2020-03-28 DIAGNOSIS — Z9189 Other specified personal risk factors, not elsewhere classified: Secondary | ICD-10-CM

## 2020-03-28 DIAGNOSIS — Z0000 Encounter for general adult medical examination without abnormal findings: Secondary | ICD-10-CM

## 2020-03-28 NOTE — Progress Notes (Signed)
Name: Alex Mitchell  DOB: 01-May-1981 MRN: [id number] PCP: Kallie Locks, FNP   Virtual Visit via MyChart  I connected with Archie Patten on 03/29/20 at 11:00 AM EDT by MyChart and verified that I am speaking with the correct person using two identifiers.  I discussed the limitations, risks, security and privacy concerns of performing an evaluation and management service by telephone and the availability of in person appointments. I also discussed with the patient that there may be a patient responsible charge related to this service. The patient expressed understanding and agreed to proceed.  Patient Location: [place] residence  Other Participants: none Provider Location: [facility]    Patient Active Problem List   Diagnosis Date Noted  . BMI 40.0-44.9, adult (HCC) 09/30/2019  . Class 2 severe obesity due to excess calories with serious comorbidity and body mass index (BMI) of 39.0 to 39.9 in adult [facility]) 09/30/2019  . Seasonal allergies 09/30/2019  . Iron deficiency anemia 05/13/2019  . 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 Healthcare maintenance 10/31/2018  . HIV disease (HCC) 10/31/2018  . Proteinuria 10/21/2018     Brief Narrative:  Alex Mitchell is a 39 y.o. 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  History of OIs: PJP pna, esophageal candidiasis   Previous Regimens: . Biktarvy 09-2018   Genotypes: . Not performed in hospital   Subjective:  CC:  HIV follow-up care.   Would like to review supplements and have labs done soon.    HPI:  Alex Mitchell is doing well today. He has a few supplements he wants to review with me to ensure they are safe to take with Biktarvy. He stopped the Hydroxycut he was previously taking when we were following his kidney function closely.   He has been working on diet and exercise changes to try to lose weight as well as researching supplementation that may offer help. He does not  have a scale at home but does feel like his clothes are fitting looser and hopeful he has lost weight. He would like to add Ashwaganda capsules and a supplement called SlimVance Core Slimming Powder Complex.   No missed doses of his Biktarvy. No concerns for side effects or access to medications. He does have   Wt Readings from Last 3 Encounters:  03/28/20 278 lb (126.1 kg)  12/30/19 278 lb 6.4 oz (126.3 kg)  12/07/19 284 lb (128.8 kg)    Review of Systems  Constitutional: Negative for appetite change, chills, fatigue, fever and unexpected weight change.  Eyes: Negative for visual disturbance.  Respiratory: Negative for cough and shortness of breath.   Cardiovascular: Negative for chest pain and leg swelling.  Gastrointestinal: Negative for abdominal pain, diarrhea and nausea.  Genitourinary: Negative for discharge, dysuria and genital sores.  Musculoskeletal: Negative for joint swelling.  Skin: Negative for color change and rash.  Neurological: Negative for dizziness and headaches.  Hematological: Negative for adenopathy.  Psychiatric/Behavioral: Negative for sleep disturbance. The patient is not nervous/anxious.       Outpatient Medications Prior to Visit  Medication Sig Dispense Refill  . bictegravir-emtricitabine-tenofovir AF (BIKTARVY) 50-200-25 MG TABS tablet Take 1 tablet by mouth daily. 30 tablet 5  . cetirizine (ZYRTEC) 10 MG tablet Take 1 tablet (10 mg total) by mouth daily. 30 tablet 11   No facility-administered medications prior to visit.     No Known Allergies  Social History   Tobacco Use  . Smoking status:  Never Smoker  . Smokeless tobacco: Never Used  Substance Use Topics  . Alcohol use: Not Currently  . Drug use: No    Social History   Substance and Sexual Activity  Sexual Activity Not Currently     Objective:   Vitals:   03/28/20 0923  Weight: 278 lb (126.1 kg)  Height: 5\' 9"  (1.753 m)   Body mass index is 41.05 kg/m.   Physical  Exam Constitutional:      Appearance: Normal appearance. He is not ill-appearing.  HENT:     Head: Normocephalic.     Mouth/Throat:     Mouth: Mucous membranes are moist.     Pharynx: Oropharynx is clear.  Eyes:     General: No scleral icterus. Pulmonary:     Effort: Pulmonary effort is normal.  Musculoskeletal:        General: Normal range of motion.     Cervical back: Normal range of motion.  Skin:    Coloration: Skin is not jaundiced or pale.  Neurological:     Mental Status: He is alert and oriented to person, place, and time.  Psychiatric:        Mood and Affect: Mood normal.        Judgment: Judgment normal.     Lab Results Lab Results  Component Value Date   WBC 7.4 12/07/2019   HGB 12.6 (L) 12/07/2019   HCT 36.8 (L) 12/07/2019   MCV 92.9 12/07/2019   PLT 218 12/07/2019    Lab Results  Component Value Date   CREATININE 1.71 (H) 12/29/2019   BUN 18 12/29/2019   NA 137 12/29/2019   K 4.1 12/29/2019   CL 106 12/29/2019   CO2 26 12/29/2019    Lab Results  Component Value Date   ALT 24 12/07/2019   AST 23 12/07/2019   ALKPHOS 132 (H) 10/21/2018   BILITOT 0.5 12/07/2019    No results found for: CHOL, HDL, LDLCALC, LDLDIRECT, TRIG, CHOLHDL HIV 1 RNA Quant (copies/mL)  Date Value  12/07/2019 <20 DETECTED (A)  08/13/2019 <20 NOT DETECTED  05/13/2019 <20 NOT DETECTED   CD4 T Cell Abs (/uL)  Date Value  12/07/2019 317 (L)  08/13/2019 221 (L)  05/13/2019 195 (L)    Assessment & Plan:   Problem List Items Addressed This Visit      Unprioritized   HIV disease ([place]) - Primary (Chronic)    Doing very well on Biktarvy. Will update pertinent labs tomorrow AM when he can come in for labs. RPR screening also to be performed for routine monitoring.  Will have him follow up again in 3-4 months per his comfort.       Relevant Orders   HIV-1 RNA quant-no reflex-bld   T-helper cell (CD4)- ([facility] clinic only)   RPR   Healthcare maintenance    Recently  received last COVID vaccine within the last 4 weeks - will defer pneumovax x 1 m.        Other Visit Diagnoses    Elevated serum creatinine       Relevant Orders   Renal Function Panel   Routine screening for STI (sexually transmitted infection)       Relevant Orders   RPR     Follow Up Instructions: RTC in AM for labs Follow up in 3-4 months  Creatinine to be repeated   I discussed the assessment and treatment plan with the patient. The patient was provided an opportunity to ask questions and all were  answered. The patient agreed with the plan and demonstrated an understanding of the instructions.   The patient was advised to call back or seek an in-person evaluation if the symptoms worsen or if the condition fails to improve as anticipated.  I provided 20 minutes of non-face-to-face time during this encounter.   Rexene Alberts, MSN, NP-C [facility] for Infectious Disease [facility]  [email or web address] Pager: [phone] Office: [phone] [facility] Main Line: [phone]

## 2020-03-29 ENCOUNTER — Other Ambulatory Visit: Payer: Self-pay

## 2020-03-29 DIAGNOSIS — Z113 Encounter for screening for infections with a predominantly sexual mode of transmission: Secondary | ICD-10-CM

## 2020-03-29 DIAGNOSIS — B20 Human immunodeficiency virus [HIV] disease: Secondary | ICD-10-CM

## 2020-03-29 DIAGNOSIS — Z9189 Other specified personal risk factors, not elsewhere classified: Secondary | ICD-10-CM | POA: Insufficient documentation

## 2020-03-29 DIAGNOSIS — R7989 Other specified abnormal findings of blood chemistry: Secondary | ICD-10-CM | POA: Insufficient documentation

## 2020-03-29 NOTE — Assessment & Plan Note (Signed)
Recently received last COVID vaccine within the last 4 weeks - will defer pneumovax x 1 m.

## 2020-03-29 NOTE — Assessment & Plan Note (Signed)
Doing very well on Biktarvy. Will update pertinent labs tomorrow AM when he can come in for labs. RPR screening also to be performed for routine monitoring.  Will have him follow up again in 3-4 months per his comfort.

## 2020-03-29 NOTE — Assessment & Plan Note (Signed)
Will review supplements with pharmacy team and let him know safety with regards to kidneys and combo with ARVs.

## 2020-03-29 NOTE — Assessment & Plan Note (Signed)
?  related to hydroxycut +/- combination with TAF. Will repeat in AM. He states his urine has cleared up in color and he feels well. No trouble with swelling.

## 2020-03-30 LAB — T-HELPER CELL (CD4) - (RCID CLINIC ONLY)
CD4 % Helper T Cell: 10 % — ABNORMAL LOW (ref 33–65)
CD4 T Cell Abs: 286 /uL — ABNORMAL LOW (ref 400–1790)

## 2020-04-01 LAB — RENAL FUNCTION PANEL
Albumin: 4.4 g/dL (ref 3.6–5.1)
BUN/Creatinine Ratio: 8 (calc) (ref 6–22)
BUN: 14 mg/dL (ref 7–25)
CO2: 26 mmol/L (ref 20–32)
Calcium: 9.4 mg/dL (ref 8.6–10.3)
Chloride: 105 mmol/L (ref 98–110)
Creat: 1.69 mg/dL — ABNORMAL HIGH (ref 0.60–1.35)
Glucose, Bld: 98 mg/dL (ref 65–99)
Phosphorus: 2.6 mg/dL (ref 2.5–4.5)
Potassium: 4.5 mmol/L (ref 3.5–5.3)
Sodium: 138 mmol/L (ref 135–146)

## 2020-04-01 LAB — RPR: RPR Ser Ql: NONREACTIVE

## 2020-04-01 LAB — HIV-1 RNA QUANT-NO REFLEX-BLD
HIV 1 RNA Quant: 30 copies/mL — ABNORMAL HIGH
HIV-1 RNA Quant, Log: 1.48 Log copies/mL — ABNORMAL HIGH

## 2020-04-06 ENCOUNTER — Other Ambulatory Visit: Payer: Self-pay | Admitting: Pharmacist

## 2020-04-06 DIAGNOSIS — B20 Human immunodeficiency virus [HIV] disease: Secondary | ICD-10-CM

## 2020-04-06 MED ORDER — DOVATO 50-300 MG PO TABS: 1.0000 | ORAL_TABLET | Freq: Every day | ORAL | 5 refills | Status: DC

## 2020-04-06 NOTE — Progress Notes (Signed)
Sending in [place] [facility] to [facility] per Rexene Alberts, NP.

## 2020-05-06 ENCOUNTER — Other Ambulatory Visit: Payer: Self-pay

## 2020-05-06 DIAGNOSIS — B20 Human immunodeficiency virus [HIV] disease: Secondary | ICD-10-CM

## 2020-05-13 ENCOUNTER — Other Ambulatory Visit: Payer: Self-pay

## 2020-05-13 DIAGNOSIS — B20 Human immunodeficiency virus [HIV] disease: Secondary | ICD-10-CM

## 2020-05-15 LAB — HIV-1 RNA QUANT-NO REFLEX-BLD
HIV 1 RNA Quant: 62 copies/mL — ABNORMAL HIGH
HIV-1 RNA Quant, Log: 1.79 Log copies/mL — ABNORMAL HIGH

## 2020-06-06 ENCOUNTER — Ambulatory Visit: Payer: Self-pay

## 2020-06-06 ENCOUNTER — Other Ambulatory Visit: Payer: Self-pay

## 2020-06-27 ENCOUNTER — Other Ambulatory Visit: Payer: Self-pay

## 2020-06-27 DIAGNOSIS — Z79899 Other long term (current) drug therapy: Secondary | ICD-10-CM

## 2020-06-27 DIAGNOSIS — B20 Human immunodeficiency virus [HIV] disease: Secondary | ICD-10-CM

## 2020-06-29 ENCOUNTER — Other Ambulatory Visit: Payer: Self-pay

## 2020-06-29 ENCOUNTER — Ambulatory Visit: Payer: Self-pay

## 2020-06-29 DIAGNOSIS — Z79899 Other long term (current) drug therapy: Secondary | ICD-10-CM

## 2020-06-29 DIAGNOSIS — B20 Human immunodeficiency virus [HIV] disease: Secondary | ICD-10-CM

## 2020-06-30 LAB — T-HELPER CELL (CD4) - (RCID CLINIC ONLY)
CD4 % Helper T Cell: 11 % — ABNORMAL LOW (ref 33–65)
CD4 T Cell Abs: 297 /uL — ABNORMAL LOW (ref 400–1790)

## 2020-07-02 LAB — CBC WITH DIFFERENTIAL/PLATELET
Absolute Monocytes: 698 cells/uL (ref 200–950)
Basophils Absolute: 43 cells/uL (ref 0–200)
Basophils Relative: 0.6 %
Eosinophils Absolute: 382 cells/uL (ref 15–500)
Eosinophils Relative: 5.3 %
HCT: 37.8 % — ABNORMAL LOW (ref 38.5–50.0)
Hemoglobin: 12.8 g/dL — ABNORMAL LOW (ref 13.2–17.1)
Lymphs Abs: 2830 cells/uL (ref 850–3900)
MCH: 32.6 pg (ref 27.0–33.0)
MCHC: 33.9 g/dL (ref 32.0–36.0)
MCV: 96.2 fL (ref 80.0–100.0)
MPV: 11.9 fL (ref 7.5–12.5)
Monocytes Relative: 9.7 %
Neutro Abs: 3247 cells/uL (ref 1500–7800)
Neutrophils Relative %: 45.1 %
Platelets: 177 10*3/uL (ref 140–400)
RBC: 3.93 10*6/uL — ABNORMAL LOW (ref 4.20–5.80)
RDW: 14.2 % (ref 11.0–15.0)
Total Lymphocyte: 39.3 %
WBC: 7.2 10*3/uL (ref 3.8–10.8)

## 2020-07-02 LAB — COMPREHENSIVE METABOLIC PANEL
AG Ratio: 1.9 (calc) (ref 1.0–2.5)
ALT: 13 U/L (ref 9–46)
AST: 21 U/L (ref 10–40)
Albumin: 4.5 g/dL (ref 3.6–5.1)
Alkaline phosphatase (APISO): 53 U/L (ref 36–130)
BUN/Creatinine Ratio: 7 (calc) (ref 6–22)
BUN: 12 mg/dL (ref 7–25)
CO2: 26 mmol/L (ref 20–32)
Calcium: 9.5 mg/dL (ref 8.6–10.3)
Chloride: 105 mmol/L (ref 98–110)
Creat: 1.83 mg/dL — ABNORMAL HIGH (ref 0.60–1.35)
Globulin: 2.4 g/dL (calc) (ref 1.9–3.7)
Glucose, Bld: 95 mg/dL (ref 65–99)
Potassium: 4.5 mmol/L (ref 3.5–5.3)
Sodium: 137 mmol/L (ref 135–146)
Total Bilirubin: 0.4 mg/dL (ref 0.2–1.2)
Total Protein: 6.9 g/dL (ref 6.1–8.1)

## 2020-07-02 LAB — LIPID PANEL
Cholesterol: 191 mg/dL (ref <200)
HDL: 39 mg/dL — ABNORMAL LOW (ref >=40)
LDL Cholesterol (Calc): 137 mg/dL (calc) — ABNORMAL HIGH
Non-HDL Cholesterol (Calc): 152 mg/dL (calc) — ABNORMAL HIGH (ref <130)
Total CHOL/HDL Ratio: 4.9 (calc) (ref <5.0)
Triglycerides: 61 mg/dL (ref <150)

## 2020-07-02 LAB — HIV-1 RNA QUANT-NO REFLEX-BLD
HIV 1 RNA Quant: 74 Copies/mL — ABNORMAL HIGH
HIV-1 RNA Quant, Log: 1.87 Log cps/mL — ABNORMAL HIGH

## 2020-07-14 ENCOUNTER — Other Ambulatory Visit: Payer: Self-pay

## 2020-07-14 ENCOUNTER — Telehealth (INDEPENDENT_AMBULATORY_CARE_PROVIDER_SITE_OTHER): Payer: Self-pay | Admitting: Infectious Diseases

## 2020-07-14 DIAGNOSIS — Z9189 Other specified personal risk factors, not elsewhere classified: Secondary | ICD-10-CM

## 2020-07-14 DIAGNOSIS — Z0000 Encounter for general adult medical examination without abnormal findings: Secondary | ICD-10-CM

## 2020-07-14 DIAGNOSIS — B20 Human immunodeficiency virus [HIV] disease: Secondary | ICD-10-CM

## 2020-07-14 DIAGNOSIS — R7989 Other specified abnormal findings of blood chemistry: Secondary | ICD-10-CM

## 2020-07-14 NOTE — Assessment & Plan Note (Signed)
Will check urinalysis again next visit - has been clear and electrolytes and BUN all normal.  No change after switching off Biktarvy. He is not hypertensive and non-diabetic.  He is unable to afford nephrology visit at this time.  Will continue to follow closely with re-introduction of TAF.

## 2020-07-14 NOTE — Assessment & Plan Note (Signed)
He will come in with all other medications/supplements and meet with me and our pharmacist Cassie next visit with [place]

## 2020-07-14 NOTE — Assessment & Plan Note (Signed)
Slight increase in viral load with Dovato. While not perfectly undetectable he has very very low level viremia which puts him at no risk for disease progression or transmission risk as long as he continues on his medication.  I do not suspect any drug failure but I do suspect possible interaction with supplement that may decrease absorption.  We discussed putting him back on Biktarvy vs alternative with Triumeq.  He felt most comfortable with Biktarvy.  I will check creatinine and urinalysis again next visit for monitoring as well as viral load.  RTC 32m with labs prior to and meds to the visit.

## 2020-07-14 NOTE — Assessment & Plan Note (Signed)
Received COVID19 vaccine - will plan flu shot in October.

## 2020-07-14 NOTE — Progress Notes (Signed)
Name: Florian Chauca  DOB: 1981-03-25 MRN: [id number] PCP: Kallie Locks, FNP   Virtual Visit via MyChart  I connected with Archie Patten on 07/14/20 at 11:15 AM EDT by VIDEO and verified that I am speaking with the correct person using two identifiers.  I discussed the limitations, risks, security and privacy concerns of performing an evaluation and management service by telephone and the availability of in person appointments. I also discussed with the patient that there may be a patient responsible charge related to this service. The patient expressed understanding and agreed to proceed.  Patient Location: [place] residence  Other Participants: none Provider Location: Provider Home    Patient Active Problem List   Diagnosis Date Noted  . Elevated serum creatinine 03/29/2020  . At risk for adverse drug interaction 03/29/2020  . BMI 40.0-44.9, adult (HCC) 09/30/2019  . Class 2 severe obesity due to excess calories with serious comorbidity and body mass index (BMI) of 39.0 to 39.9 in adult [facility]) 09/30/2019  . Seasonal allergies 09/30/2019  . Iron deficiency anemia 05/13/2019  . 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 Healthcare maintenance 10/31/2018  . HIV disease (HCC) 10/31/2018     Brief Narrative:  Hamed Debella is a 39 y.o. 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  History of OIs: PJP pna, esophageal candidiasis   Previous Regimens: . [id number]  . Dovato (increased SCr)  Genotypes: . Not performed in hospital   Subjective:   Chief Complaint  Patient presents with  . Follow-up    no complaints; has received both Pfizer vaccines;      HPI:  Advaith is doing well today. He has returned working out and has been doing a lot of running and walking for exercise. Feels good and helps with stress too.  Has been taking dovato every day without any missed doses.  Added back Iron, cranberry juice  and has had a few supplements daily for wellness.   Received COVID 19 vaccine series and would like recommendations on flu shot.   Eating and sleeping well. No concerns over anxious or depressed mood.    Review of Systems  Constitutional: Negative for appetite change, chills, fatigue, fever and unexpected weight change.  Eyes: Negative for visual disturbance.  Respiratory: Negative for cough and shortness of breath.   Cardiovascular: Negative for chest pain and leg swelling.  Gastrointestinal: Negative for abdominal pain, diarrhea and nausea.  Genitourinary: Negative for discharge, dysuria and genital sores.  Musculoskeletal: Negative for joint swelling.  Skin: Negative for color change and rash.  Neurological: Negative for dizziness and headaches.  Hematological: Negative for adenopathy.  Psychiatric/Behavioral: Negative for sleep disturbance. The patient is not nervous/anxious.       Outpatient Medications Prior to Visit  Medication Sig Dispense Refill  . cetirizine (ZYRTEC) 10 MG tablet Take 1 tablet (10 mg total) by mouth daily. 30 tablet 11  . Dolutegravir-lamiVUDine (DOVATO) 50-300 MG TABS Take 1 tablet by mouth daily. 30 tablet 5   No facility-administered medications prior to visit.     No Known Allergies  Social History   Tobacco Use  . Smoking status: Never Smoker  . Smokeless tobacco: Never Used  Vaping Use  . Vaping Use: Never used  Substance Use Topics  . Alcohol use: Not Currently  . Drug use: No    Social History   Substance and Sexual Activity  Sexual Activity Not Currently  Objective:   There were no vitals filed for this visit. There is no height or weight on file to calculate BMI.   Physical Exam Constitutional:      Appearance: Normal appearance. He is not ill-appearing.  HENT:     Head: Normocephalic.     Mouth/Throat:     Mouth: Mucous membranes are moist.     Pharynx: Oropharynx is clear.  Eyes:     General: No scleral  icterus. Pulmonary:     Effort: Pulmonary effort is normal.  Musculoskeletal:        General: Normal range of motion.     Cervical back: Normal range of motion.  Skin:    Coloration: Skin is not jaundiced or pale.  Neurological:     Mental Status: He is alert and oriented to person, place, and time.  Psychiatric:        Mood and Affect: Mood normal.        Judgment: Judgment normal.     Lab Results Lab Results  Component Value Date   WBC 7.2 06/29/2020   HGB 12.8 (L) 06/29/2020   HCT 37.8 (L) 06/29/2020   MCV 96.2 06/29/2020   PLT 177 06/29/2020    Lab Results  Component Value Date   CREATININE 1.83 (H) 06/29/2020   BUN 12 06/29/2020   NA 137 06/29/2020   K 4.5 06/29/2020   CL 105 06/29/2020   CO2 26 06/29/2020    Lab Results  Component Value Date   ALT 13 06/29/2020   AST 21 06/29/2020   ALKPHOS 132 (H) 10/21/2018   BILITOT 0.4 06/29/2020    Lab Results  Component Value Date   CHOL 191 06/29/2020   HDL 39 (L) 06/29/2020   LDLCALC 137 (H) 06/29/2020   TRIG 61 06/29/2020   CHOLHDL 4.9 06/29/2020   HIV 1 RNA Quant  Date Value  06/29/2020 74 Copies/mL (H)  05/13/2020 62 copies/mL (H)  03/29/2020 30 copies/mL (H)   CD4 T Cell Abs (/uL)  Date Value  06/29/2020 297 (L)  03/29/2020 286 (L)  12/07/2019 317 (L)    Assessment & Plan:   Problem List Items Addressed This Visit      Unprioritized   HIV disease (HCC) (Chronic)    Slight increase in viral load with Dovato. While not perfectly undetectable he has very very low level viremia which puts him at no risk for disease progression or transmission risk as long as he continues on his medication.  I do not suspect any drug failure but I do suspect possible interaction with supplement that may decrease absorption.  We discussed putting him back on Biktarvy vs alternative with Triumeq.  He felt most comfortable with Biktarvy.  I will check creatinine and urinalysis again next visit for monitoring as well as  viral load.  RTC 78m with labs prior to and meds to the visit.       Relevant Orders   HIV-1 RNA quant-no reflex-bld   T-helper cell (CD4)- ([facility] clinic only)   Healthcare maintenance    Received COVID19 vaccine - will plan flu shot in October.        Elevated serum creatinine    Will check urinalysis again next visit - has been clear and electrolytes and BUN all normal.  No change after switching off Biktarvy. He is not hypertensive and non-diabetic.  He is unable to afford nephrology visit at this time.  Will continue to follow closely with re-introduction of TAF.  Relevant Orders   Urinalysis   Renal Function Panel   At risk for adverse drug interaction    He will come in with all other medications/supplements and meet with me and our pharmacist Cassie next visit with [place]         Rexene Alberts, MSN, NP-C [facility]  [email or web address] Pager: [phone] Office: [phone] [facility] Main Line: [phone]

## 2020-07-20 ENCOUNTER — Other Ambulatory Visit: Payer: Self-pay

## 2020-07-20 ENCOUNTER — Ambulatory Visit (INDEPENDENT_AMBULATORY_CARE_PROVIDER_SITE_OTHER): Payer: Self-pay | Admitting: Family Medicine

## 2020-07-20 ENCOUNTER — Encounter: Payer: Self-pay | Admitting: Family Medicine

## 2020-07-20 VITALS — BP 121/79 | HR 64 | Temp 98.1°F | Resp 16 | Ht 69.0 in | Wt 271.8 lb

## 2020-07-20 DIAGNOSIS — Z6841 Body Mass Index (BMI) 40.0 and over, adult: Secondary | ICD-10-CM

## 2020-07-20 DIAGNOSIS — E66813 Obesity, class 3: Secondary | ICD-10-CM

## 2020-07-20 DIAGNOSIS — Z09 Encounter for follow-up examination after completed treatment for conditions other than malignant neoplasm: Secondary | ICD-10-CM

## 2020-07-20 DIAGNOSIS — Z21 Asymptomatic human immunodeficiency virus [HIV] infection status: Secondary | ICD-10-CM

## 2020-07-20 DIAGNOSIS — B20 Human immunodeficiency virus [HIV] disease: Secondary | ICD-10-CM

## 2020-07-20 LAB — POCT URINALYSIS DIPSTICK
Bilirubin, UA: NEGATIVE
Blood, UA: NEGATIVE
Glucose, UA: NEGATIVE
Ketones, UA: NEGATIVE
Leukocytes, UA: NEGATIVE
Nitrite, UA: NEGATIVE
Protein, UA: NEGATIVE
Spec Grav, UA: 1.02 (ref 1.010–1.025)
Urobilinogen, UA: 0.2 E.U./dL
pH, UA: 6.5 (ref 5.0–8.0)

## 2020-07-20 NOTE — Progress Notes (Signed)
[facility]   Established Patient Office Visit  Subjective:  Patient ID: Alex Mitchell, male    DOB: 26-Aug-1981  Age: 39 y.o. MRN: [id number]  CC:  Chief Complaint  Patient presents with  . Follow-up    Pt states no question or concerns pt stats he feels good.    HPI Alex Mitchell is a 39 year old male who presents for Follow Up today.  Patient Active Problem List   Diagnosis Date Noted  . Elevated serum creatinine 03/29/2020  . At risk for adverse drug interaction 03/29/2020  . BMI 40.0-44.9, adult (HCC) 09/30/2019  . Class 2 severe obesity due to excess calories with serious comorbidity and body mass index (BMI) of 39.0 to 39.9 in adult [facility]) 09/30/2019  . Seasonal allergies 09/30/2019  . Iron deficiency anemia 05/13/2019  . 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 Healthcare maintenance 10/31/2018  . HIV disease (HCC) 10/31/2018      Past Medical History:  Diagnosis Date  . AIDS (acquired immune deficiency syndrome) (HCC) 10/31/2018  . [place]   . Mouth sores    Current Status: Since his last office visit, he continues to follow up with Infection Disease as needed. He is now eating healthier meals, increased water intake, and now enrolled in Zumba. He continues to follow up with Infection Disease. He denies fevers, chills, fatigue, recent infections, weight loss, and night sweats. He has not had any headaches, visual changes, dizziness, and falls. No chest pain, heart palpitations, cough and shortness of breath reported. Denies GI problems such as nausea, vomiting, diarrhea, and constipation. He has no reports of blood in stools, dysuria and hematuria. No depression or anxiety, and denies suicidal ideations, homicidal ideations, or auditory hallucinations. He is taking all medications as prescribed. He denies pain today.     No past surgical history on file.  Family History  Problem Relation Age of Onset  . Hypertension  Other   . Hypertension Father   . Diabetes Neg Hx     Social History   Socioeconomic History  . Marital status: Single    Spouse name: Not on file  . Number of children: Not on file  . Years of education: Not on file  . Highest education level: Not on file  Occupational History  . Not on file  Tobacco Use  . Smoking status: Never Smoker  . Smokeless tobacco: Never Used  Vaping Use  . Vaping Use: Never used  Substance and Sexual Activity  . Alcohol use: Not Currently  . Drug use: No  . Sexual activity: Not Currently  Other Topics Concern  . Not on file  Social History Narrative  . Not on file   Social Determinants of Health   Financial Resource Strain:   . Difficulty of Paying Living Expenses: Not on file  Food Insecurity:   . Worried About Programme researcher, broadcasting/film/video in the Last Year: Not on file  . Ran Out of Food in the Last Year: Not on file  Transportation Needs:   . Lack of Transportation (Medical): Not on file  . Lack of Transportation (Non-Medical): Not on file  Physical Activity:   . Days of Exercise per Week: Not on file  . Minutes of Exercise per Session: Not on file  Stress:   . Feeling of Stress : Not on file  Social Connections:   . Frequency of Communication with Friends and Family: Not on file  . Frequency of Social Gatherings  with Friends and Family: Not on file  . Attends Religious Services: Not on file  . Active Member of Clubs or Organizations: Not on file  . Attends Banker Meetings: Not on file  . Marital Status: Not on file  Intimate Partner Violence:   . Fear of Current or Ex-Partner: Not on file  . Emotionally Abused: Not on file  . Physically Abused: Not on file  . Sexually Abused: Not on file    Outpatient Medications Prior to Visit  Medication Sig Dispense Refill  . cetirizine (ZYRTEC) 10 MG tablet Take 1 tablet (10 mg total) by mouth daily. 30 tablet 11  . Cranberry 250 MG CHEW Chew by mouth.    . Dolutegravir-lamiVUDine  (DOVATO) 50-300 MG TABS Take 1 tablet by mouth daily. 30 tablet 5  . ferrous sulfate 325 (65 FE) MG tablet Take 325 mg by mouth daily with breakfast.     No facility-administered medications prior to visit.    No Known Allergies  ROS Review of Systems  Constitutional: Negative.   HENT: Negative.   Eyes: Negative.   Respiratory: Negative.   Cardiovascular: Negative.   Gastrointestinal: Negative.   Endocrine: Negative.   Genitourinary: Negative.   Musculoskeletal: Negative.   Skin: Negative.   Allergic/Immunologic: Negative.   Neurological: Positive for dizziness (occasional ) and headaches (occasional ).  Hematological: Negative.   Psychiatric/Behavioral: Negative.       Objective:    Physical Exam Vitals and nursing note reviewed.  Constitutional:      Appearance: Normal appearance.  HENT:     Head: Normocephalic and atraumatic.     Nose: Nose normal.     Mouth/Throat:     Mouth: Mucous membranes are moist.     Pharynx: Oropharynx is clear.  Cardiovascular:     Rate and Rhythm: Normal rate and regular rhythm.     Pulses: Normal pulses.     Heart sounds: Normal heart sounds.  Pulmonary:     Effort: Pulmonary effort is normal.     Breath sounds: Normal breath sounds.  Abdominal:     General: Bowel sounds are normal. There is distension (obese).     Palpations: Abdomen is soft.  Musculoskeletal:        General: Normal range of motion.     Cervical back: Normal range of motion and neck supple.  Skin:    General: Skin is warm and dry.  Neurological:     General: No focal deficit present.     Mental Status: He is alert and oriented to person, place, and time.  Psychiatric:        Mood and Affect: Mood normal.        Behavior: Behavior normal.        Thought Content: Thought content normal.        Judgment: Judgment normal.    BP 121/79 (BP Location: Right Arm, Patient Position: Sitting, Cuff Size: Large)   Pulse 64   Temp 98.1 F (36.7 C)   Resp 16   Ht  5\' 9"  (1.753 m)   Wt 271 lb 12.8 oz (123.3 kg)   SpO2 100%   BMI 40.14 kg/m  Wt Readings from Last 3 Encounters:  07/20/20 271 lb 12.8 oz (123.3 kg)  03/28/20 278 lb (126.1 kg)  12/30/19 278 lb 6.4 oz (126.3 kg)    Health Maintenance Due  Topic Date Due  . INFLUENZA VACCINE  06/19/2020    There are no preventive care reminders to  display for this patient.  Lab Results  Component Value Date   TSH 1.030 10/21/2018   Lab Results  Component Value Date   WBC 7.2 06/29/2020   HGB 14.2 07/20/2020   HCT 40.9 07/20/2020   MCV 96.2 06/29/2020   PLT 177 06/29/2020   Lab Results  Component Value Date   NA 137 06/29/2020   K 4.5 06/29/2020   CO2 26 06/29/2020   GLUCOSE 95 06/29/2020   BUN 12 06/29/2020   CREATININE 1.83 (H) 06/29/2020   BILITOT 0.4 06/29/2020   ALKPHOS 132 (H) 10/21/2018   AST 21 06/29/2020   ALT 13 06/29/2020   PROT 6.9 06/29/2020   ALBUMIN 2.6 (L) 10/21/2018   CALCIUM 9.5 06/29/2020   ANIONGAP 11 10/29/2018   Lab Results  Component Value Date   CHOL 191 06/29/2020   Lab Results  Component Value Date   HDL 39 (L) 06/29/2020   Lab Results  Component Value Date   LDLCALC 137 (H) 06/29/2020   Lab Results  Component Value Date   TRIG 61 06/29/2020   Lab Results  Component Value Date   CHOLHDL 4.9 06/29/2020   Lab Results  Component Value Date   HGBA1C 5.2 12/30/2019      Assessment & Plan:   1. Asymptomatic HIV infection (HCC) - Vitamin D, 25-hydroxy - Vitamin B12 - Hemoglobin and Hematocrit, Blood - Iron and TIBC([facility]) - Urinalysis Dipstick  2. HIV disease (HCC)  3. Class 3 severe obesity due to excess calories without serious comorbidity with body mass index (BMI) of 40.0 to 44.9 in adult [facility]) Body mass index is 40.14 kg/m. Goal BMI  is <30. Encouraged efforts to reduce weight include engaging in physical activity as tolerated with goal of 150 minutes per week. Improve dietary choices and eat a meal regimen  consistent with a Mediterranean or DASH diet. Reduce simple carbohydrates. Do not skip meals and eat healthy snacks throughout the day to avoid over-eating at dinner. Set a goal weight loss that is achievable for you.  4. Follow up He will follow up in 6 months.  No orders of the defined types were placed in this encounter.   Orders Placed This Encounter  Procedures  . Vitamin D, 25-hydroxy  . Vitamin B12  . Hemoglobin and Hematocrit, Blood  . Iron and TIBC([facility])  . Urinalysis Dipstick    Referral Orders  No referral(s) requested today    Raliegh Ip,  MSN, FNP-BC  [facility] [address] [place], [place] [address] [phone] fax   Problem List Items Addressed This Visit      Other   HIV disease (HCC) (Chronic)    Other Visit Diagnoses    Asymptomatic HIV infection (HCC)    -  Primary   Relevant Orders   Vitamin D, 25-hydroxy (Completed)   Vitamin B12 (Completed)   Hemoglobin and Hematocrit, Blood (Completed)   Iron and TIBC([facility]) (Completed)   Urinalysis Dipstick (Completed)   Class 3 severe obesity due to excess calories without serious comorbidity with body mass index (BMI) of 40.0 to 44.9 in adult [facility])       Follow up          No orders of the defined types were placed in this encounter.   Follow-up: No follow-ups on file.    Kallie Locks, FNP

## 2020-07-21 LAB — HEMOGLOBIN AND HEMATOCRIT, BLOOD
Hematocrit: 40.9 % (ref 37.5–51.0)
Hemoglobin: 14.2 g/dL (ref 13.0–17.7)

## 2020-07-21 LAB — VITAMIN D 25 HYDROXY (VIT D DEFICIENCY, FRACTURES): Vit D, 25-Hydroxy: 29.9 ng/mL — ABNORMAL LOW (ref 30.0–100.0)

## 2020-07-21 LAB — IRON AND TIBC
Iron Saturation: 20 % (ref 15–55)
Iron: 66 ug/dL (ref 38–169)
Total Iron Binding Capacity: 327 ug/dL (ref 250–450)
UIBC: 261 ug/dL (ref 111–343)

## 2020-07-21 LAB — VITAMIN B12: Vitamin B-12: 1271 pg/mL — ABNORMAL HIGH (ref 232–1245)

## 2020-07-21 NOTE — Progress Notes (Signed)
Pt was called @ 11:04 am to discuss his labs. Pt stated he understood and will keep his f/u appt.

## 2020-07-22 ENCOUNTER — Other Ambulatory Visit: Payer: Self-pay | Admitting: Infectious Diseases

## 2020-07-22 DIAGNOSIS — B20 Human immunodeficiency virus [HIV] disease: Secondary | ICD-10-CM

## 2020-07-24 ENCOUNTER — Encounter: Payer: Self-pay | Admitting: Family Medicine

## 2020-08-01 ENCOUNTER — Encounter: Payer: Self-pay | Admitting: Infectious Diseases

## 2020-09-16 ENCOUNTER — Other Ambulatory Visit: Payer: Self-pay | Admitting: Infectious Diseases

## 2020-09-16 ENCOUNTER — Telehealth: Payer: Self-pay

## 2020-09-16 DIAGNOSIS — B20 Human immunodeficiency virus [HIV] disease: Secondary | ICD-10-CM

## 2020-09-16 NOTE — Telephone Encounter (Signed)
Patient confirmed that he is no longer taking Dovato and only taking Biktarvy. Per last video visit with S. Dixon, NP patient was comfortable with this switch.  Valarie Cones

## 2020-09-17 NOTE — Telephone Encounter (Signed)
Excellent thank you

## 2020-09-19 ENCOUNTER — Other Ambulatory Visit: Payer: Medicaid Other

## 2020-09-20 ENCOUNTER — Other Ambulatory Visit: Payer: Self-pay

## 2020-09-20 DIAGNOSIS — R7989 Other specified abnormal findings of blood chemistry: Secondary | ICD-10-CM

## 2020-09-20 DIAGNOSIS — B20 Human immunodeficiency virus [HIV] disease: Secondary | ICD-10-CM

## 2020-09-21 LAB — T-HELPER CELL (CD4) - (RCID CLINIC ONLY)
CD4 % Helper T Cell: 12 % — ABNORMAL LOW (ref 33–65)
CD4 T Cell Abs: 316 /uL — ABNORMAL LOW (ref 400–1790)

## 2020-09-23 LAB — RENAL FUNCTION PANEL
Albumin: 4.8 g/dL (ref 3.6–5.1)
BUN/Creatinine Ratio: 10 (calc) (ref 6–22)
BUN: 17 mg/dL (ref 7–25)
CO2: 26 mmol/L (ref 20–32)
Calcium: 9.9 mg/dL (ref 8.6–10.3)
Chloride: 104 mmol/L (ref 98–110)
Creat: 1.74 mg/dL — ABNORMAL HIGH (ref 0.60–1.35)
Glucose, Bld: 89 mg/dL (ref 65–99)
Phosphorus: 3.2 mg/dL (ref 2.5–4.5)
Potassium: 4.4 mmol/L (ref 3.5–5.3)
Sodium: 139 mmol/L (ref 135–146)

## 2020-09-23 LAB — HIV-1 RNA QUANT-NO REFLEX-BLD
HIV 1 RNA Quant: 32 Copies/mL — ABNORMAL HIGH
HIV-1 RNA Quant, Log: 1.5 Log cps/mL — ABNORMAL HIGH

## 2020-10-04 ENCOUNTER — Encounter: Payer: Medicaid Other | Admitting: Infectious Diseases

## 2020-10-11 ENCOUNTER — Ambulatory Visit (INDEPENDENT_AMBULATORY_CARE_PROVIDER_SITE_OTHER): Payer: Self-pay | Admitting: Infectious Diseases

## 2020-10-11 ENCOUNTER — Ambulatory Visit: Payer: Self-pay | Admitting: Pharmacist

## 2020-10-11 ENCOUNTER — Other Ambulatory Visit: Payer: Self-pay

## 2020-10-11 ENCOUNTER — Encounter: Payer: Self-pay | Admitting: Infectious Diseases

## 2020-10-11 VITALS — BP 143/79 | HR 69 | Temp 98.3°F | Wt 271.0 lb

## 2020-10-11 DIAGNOSIS — Z23 Encounter for immunization: Secondary | ICD-10-CM

## 2020-10-11 DIAGNOSIS — D509 Iron deficiency anemia, unspecified: Secondary | ICD-10-CM

## 2020-10-11 DIAGNOSIS — Z21 Asymptomatic human immunodeficiency virus [HIV] infection status: Secondary | ICD-10-CM

## 2020-10-11 DIAGNOSIS — Z0000 Encounter for general adult medical examination without abnormal findings: Secondary | ICD-10-CM

## 2020-10-11 DIAGNOSIS — B20 Human immunodeficiency virus [HIV] disease: Secondary | ICD-10-CM

## 2020-10-11 NOTE — Assessment & Plan Note (Addendum)
Continues to be very well controlled with nice recovery > 300 for CD4 cells. We talked about advantages/disadvantages with switching to Cabenuva injections. He is interested but wants to think about this and read about it more.  For now he will switch biktarvy dose to the PM when he gets home from work so he can continue to enjoy his supplements and herbals without risk for chelation to bictegravir.  RTC in 4 months.

## 2020-10-11 NOTE — Addendum Note (Signed)
Addended by: Lorenso Courier on: 10/11/2020 10:12 AM   Modules accepted: Orders

## 2020-10-11 NOTE — Assessment & Plan Note (Addendum)
Vaccination counseling discussed at today's visit. Updated as outlined per recommendations for [facility] at today's visit. Pneumovax and Flu shot given today. He will get Pfizer booster at his ability through [employer].  Will draw Hep A and B antibodies as this has not ever been done to determine further vaccination recommendations.  He will need menveo at next visit.  Last colon cancer screening: [facility] Colonoscopy    This patient has no relevant Health Maintenance data.     Depression screening discussed today - no needs at this time.  Patient is receiving dental care through no provider at this time - [facility] referral placed today and paperwork completed. No needs identified today aside from routine care.  STI screening politely declined today

## 2020-10-11 NOTE — Progress Notes (Signed)
Did not see patient.

## 2020-10-11 NOTE — Assessment & Plan Note (Signed)
Has improved. He is taking iron in the form of Herbalife supplements. No symptoms. Monitor with routine CBCs.

## 2020-10-11 NOTE — Progress Notes (Signed)
Name: Alex Mitchell  DOB: 1981-02-07 MRN: [id number] PCP: Azzie Glatter, FNP    Patient Active Problem List   Diagnosis Date Noted  . Elevated serum creatinine 03/29/2020  . At risk for adverse drug interaction 03/29/2020  . BMI 40.0-44.9, adult ([place]) 09/30/2019  . Class 2 severe obesity due to excess calories with serious comorbidity and body mass index (BMI) of 39.0 to 39.9 in adult [facility]) 09/30/2019  . Seasonal allergies 09/30/2019  . Iron deficiency anemia 05/13/2019  . Healthcare maintenance 10/31/2018  . HIV disease ([place]) 10/31/2018     Brief Narrative:  Alex Mitchell is a 39 y.o. 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  History of OIs: PJP pna, esophageal candidiasis   Previous Regimens: . Biktarvy 09-2018    Genotypes: . Not performed in hospital   Subjective:   Chief Complaint  Patient presents with  . Follow-up     HPI:  Alex Mitchell is doing well today. 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 Alex Mitchell enjoys his work and finds that it has helped his mood and ability to lose some weight. Down about 10 lbs since last visit. Drinking lots of water, takes in caffeine typically in the mornings only but sometimes in the evenings. Overall eating and sleeping well. No concerns over anxious or depressed mood. No partners currently and not looking.  Needs a dental visit - Alex Mitchell has no immediate needs but does want routine cleanings.    Review of Systems  Constitutional: Negative for appetite change, chills, fatigue, fever and unexpected weight change.  Eyes: Negative for visual disturbance.  Respiratory: Negative for cough and shortness of breath.   Cardiovascular: Negative for chest pain and leg swelling.  Gastrointestinal: Negative for abdominal pain, diarrhea and nausea.  Genitourinary: Negative for discharge, dysuria and genital sores.  Musculoskeletal: Negative for joint swelling.  Skin:  Negative for color change and rash.  Neurological: Negative for dizziness and headaches.  Hematological: Negative for adenopathy.  Psychiatric/Behavioral: Negative for sleep disturbance. The patient is not nervous/anxious.       Outpatient Medications Prior to Visit  Medication Sig Dispense Refill  . BIKTARVY 50-200-25 MG TABS tablet TAKE 1 TABLET BY MOUTH DAILY 30 tablet 4  . cetirizine (ZYRTEC) 10 MG tablet Take 1 tablet (10 mg total) by mouth daily. 30 tablet 11  . Cranberry 250 MG CHEW Chew by mouth.    . ferrous sulfate 325 (65 FE) MG tablet Take 325 mg by mouth daily with breakfast.     No facility-administered medications prior to visit.     No Known Allergies  Social History   Tobacco Use  . Smoking status: Never Smoker  . Smokeless tobacco: Never Used  Vaping Use  . Vaping Use: Never used  Substance Use Topics  . Alcohol use: Not Currently  . Drug use: No    Social History   Substance and Sexual Activity  Sexual Activity Not Currently     Objective:   Vitals:   10/11/20 0919  BP: (!) 143/79  Pulse: 69  Temp: 98.3 F (36.8 C)  TempSrc: Oral  Weight: 271 lb (122.9 kg)   Body mass index is 40.02 kg/m.   Physical Exam Constitutional:      Appearance: Normal appearance. Alex Mitchell is not ill-appearing.  HENT:     Head: Normocephalic.     Mouth/Throat:     Mouth: Mucous membranes are moist.  Pharynx: Oropharynx is clear.  Eyes:     General: No scleral icterus. Pulmonary:     Effort: Pulmonary effort is normal.  Musculoskeletal:        General: Normal range of motion.     Cervical back: Normal range of motion.  Skin:    Coloration: Skin is not jaundiced or pale.  Neurological:     Mental Status: Alex Mitchell is alert and oriented to person, place, and time.  Psychiatric:        Mood and Affect: Mood normal.        Judgment: Judgment normal.     Lab Results Lab Results  Component Value Date   WBC 7.2 06/29/2020   HGB 14.2 07/20/2020   HCT 40.9  07/20/2020   MCV 96.2 06/29/2020   PLT 177 06/29/2020    Lab Results  Component Value Date   CREATININE 1.74 (H) 09/20/2020   BUN 17 09/20/2020   NA 139 09/20/2020   K 4.4 09/20/2020   CL 104 09/20/2020   CO2 26 09/20/2020    Lab Results  Component Value Date   ALT 13 06/29/2020   AST 21 06/29/2020   ALKPHOS 132 (H) 10/21/2018   BILITOT 0.4 06/29/2020    Lab Results  Component Value Date   CHOL 191 06/29/2020   HDL 39 (L) 06/29/2020   LDLCALC 137 (H) 06/29/2020   TRIG 61 06/29/2020   CHOLHDL 4.9 06/29/2020   HIV 1 RNA Quant  Date Value  09/20/2020 32 Copies/mL (H)  06/29/2020 74 Copies/mL (H)  05/13/2020 62 copies/mL (H)   CD4 T Cell Abs (/uL)  Date Value  09/20/2020 316 (L)  06/29/2020 297 (L)  03/29/2020 286 (L)    Assessment & Plan:   Problem List Items Addressed This Visit      Unprioritized   Iron deficiency anemia    Has improved. Alex Mitchell is taking iron in the form of Herbalife supplements. No symptoms. Monitor with routine CBCs.       HIV disease ([place]) (Chronic)    Continues to be very well controlled with nice recovery > 300 for CD4 cells. We talked about advantages/disadvantages with switching to Cabenuva injections. Alex Mitchell is interested but wants to think about this and read about it more.  For now Alex Mitchell will switch biktarvy dose to the PM when Alex Mitchell gets home from work so Alex Mitchell can continue to enjoy his supplements and herbals without risk for chelation to bictegravir.  RTC in 4 months.       Healthcare maintenance    Vaccination counseling discussed at today's visit. Updated as outlined per recommendations for [facility] at today's visit. Pneumovax and Flu shot given today. Alex Mitchell will get Pfizer booster at his ability through [employer].  Will draw Hep A and B antibodies as this has not ever been done to determine further vaccination recommendations.  Alex Mitchell will need menveo at next visit.  Last colon cancer screening: [facility] Colonoscopy    This patient has no relevant Health  Maintenance data.     Depression screening discussed today - no needs at this time.  Patient is receiving dental care through no provider at this time - [facility] referral placed today and paperwork completed. No needs identified today aside from routine care.  STI screening politely declined today         Other Visit Diagnoses    Asymptomatic HIV infection, with no history of HIV-related illness ([place])    -  Primary   Relevant Orders  Hepatitis B surface antibody,qualitative   HIV-1 RNA quant-no reflex-bld   T-helper cell (CD4)- ([facility] clinic only)   Hepatitis A Ab, Total   HLA B*[id number]      Janene Madeira, MSN, NP-C [facility] [email or web address] Pager: [phone] Office: [phone] [place]: [phone]

## 2020-10-11 NOTE — Patient Instructions (Addendum)
Nice to see you as always!  Keep up the good work on yourself. I would switch the Biktarvy to the evenings so you can enjoy your supplements and herbal teas without any concern for interfering with absorption   Please call [facility] @ [phone] to schedule a dental appointment - give them a chance to call you first.   Continue the Biktarvy for now - it is working very well for you.   Please schedule your Pfizer booster either here on fridays or at [facility] to better fit your schedule. You can schedule at any time.    CABENUVA is the new medication to treat HIV. It is an injection in each butt cheek every 30 days at this time.   We would start with a pill "lead in" period for you where you take 2 pills once a day with food for 28 days to make sure you tolerate them well before we do the long acting injection.   Largely the side effects were of course related to the injection itself, but during the studies where this was used to treat HIV patients there were only a very small (< 5) number of patients that stopped it due to pain from the shots. Most of the patients in the trials reported overall improvement in the pain associated with the shots over time.   Patient Information Link: https://gskpro.com/content/dam/global/hcpportal/en_US/Prescribing_Information/Cabenuva/pdf/CABENUVA-PI-PIL-[email or web address]-IFU3.PDF#page=36

## 2020-10-27 ENCOUNTER — Ambulatory Visit: Payer: Medicaid Other

## 2020-11-02 ENCOUNTER — Encounter: Payer: Self-pay | Admitting: Family Medicine

## 2020-11-02 ENCOUNTER — Other Ambulatory Visit: Payer: Self-pay

## 2020-11-02 ENCOUNTER — Ambulatory Visit (INDEPENDENT_AMBULATORY_CARE_PROVIDER_SITE_OTHER): Payer: Self-pay | Admitting: Family Medicine

## 2020-11-02 VITALS — BP 134/75 | HR 65 | Temp 97.6°F | Ht 69.0 in | Wt 262.0 lb

## 2020-11-02 DIAGNOSIS — Z09 Encounter for follow-up examination after completed treatment for conditions other than malignant neoplasm: Secondary | ICD-10-CM

## 2020-11-02 DIAGNOSIS — H9201 Otalgia, right ear: Secondary | ICD-10-CM

## 2020-11-02 NOTE — Progress Notes (Signed)
[facility]   Sick Visit  Subjective:  Patient ID: Alex Mitchell, male    DOB: [birth year]  Age: 39 y.o. MRN: [id number]  CC:  Chief Complaint  Patient presents with  . Otalgia    Right ear pain, earring stuck in right ear lobe    HPI Alex Mitchell is a 39 year old male who presents for Sick Visit today.    Patient Active Problem List   Diagnosis Date Noted  . Elevated serum creatinine 03/29/2020  . At risk for adverse drug interaction 03/29/2020  . BMI 40.0-44.9, adult ([facility]) 09/30/2019  . Class 2 severe obesity due to excess calories with serious comorbidity and body mass index (BMI) of 39.0 to 39.9 in adult [facility]) 09/30/2019  . Seasonal allergies 09/30/2019  . Iron deficiency anemia 05/13/2019  . Healthcare maintenance 10/31/2018  . HIV disease ([facility]) 10/31/2018    Current Status: Since his last office visit, he is doing well with no complaints.  He has c/o ear lobe pain r/t recent ear piercing, and earring was accidentally pulled out of his ear while at work. He denies fevers, chills, fatigue, recent infections, weight loss, and night sweats. He has not had any headaches, visual changes, dizziness, and falls. No chest pain, heart palpitations, cough and shortness of breath reported. Denies GI problems such as nausea, vomiting, diarrhea, and constipation. He has no reports of blood in stools, dysuria and hematuria. No depression or anxiety, and denies suicidal ideations, homicidal ideations, or auditory hallucinations. He is taking all medications as prescribed. He denies pain today.   Past Medical History:  Diagnosis Date  . AIDS (acquired immune deficiency syndrome) (HCC) 10/31/2018  . [place]   . Mouth sores     No past surgical history on file.  Family History  Problem Relation Age of Onset  . Hypertension Other   . Hypertension Father   . Diabetes Neg Hx     Social History   Socioeconomic History  . Marital  status: Single    Spouse name: Not on file  . Number of children: Not on file  . Years of education: Not on file  . Highest education level: Not on file  Occupational History  . Not on file  Tobacco Use  . Smoking status: Never Smoker  . Smokeless tobacco: Never Used  Vaping Use  . Vaping Use: Never used  Substance and Sexual Activity  . Alcohol use: Not Currently  . Drug use: No  . Sexual activity: Not Currently  Other Topics Concern  . Not on file  Social History Narrative  . Not on file   Social Determinants of Health   Financial Resource Strain: Not on file  Food Insecurity: Not on file  Transportation Needs: Not on file  Physical Activity: Not on file  Stress: Not on file  Social Connections: Not on file  Intimate Partner Violence: Not on file    Outpatient Medications Prior to Visit  Medication Sig Dispense Refill  . BIKTARVY 50-200-25 MG TABS tablet TAKE 1 TABLET BY MOUTH DAILY 30 tablet 4  . cetirizine (ZYRTEC) 10 MG tablet Take 1 tablet (10 mg total) by mouth daily. 30 tablet 11  . Cranberry 250 MG CHEW Chew by mouth.    . ferrous sulfate 325 (65 FE) MG tablet Take 325 mg by mouth daily with breakfast.     No facility-administered medications prior to visit.    No Known Allergies  ROS Review  of Systems  HENT: Positive for ear pain (right earlobe pain).   Respiratory: Negative.   Cardiovascular: Negative.   Musculoskeletal: Negative.   Psychiatric/Behavioral: Negative.       Objective:    Physical Exam Vitals and nursing note reviewed.  Constitutional:      Appearance: Normal appearance.  HENT:     Ears:     Comments: Right earlobe swelling, and serous sanguinous discharge.  Neurological:     Mental Status: He is alert.     BP 134/75   Pulse 65   Temp 97.6 F (36.4 C) (Oral)   Ht 5\' 9"  (1.753 m)   Wt 262 lb (118.8 kg)   SpO2 100%   BMI 38.69 kg/m  Wt Readings from Last 3 Encounters:  11/02/20 262 lb (118.8 kg)  10/11/20 271 lb  (122.9 kg)  07/20/20 271 lb 12.8 oz (123.3 kg)     Health Maintenance Due  Topic Date Due  . COVID-19 Vaccine (3 - Pfizer risk 4-dose series) 04/04/2020    There are no preventive care reminders to display for this patient.  Lab Results  Component Value Date   TSH 1.030 10/21/2018   Lab Results  Component Value Date   WBC 7.2 06/29/2020   HGB 14.2 07/20/2020   HCT 40.9 07/20/2020   MCV 96.2 06/29/2020   PLT 177 06/29/2020   Lab Results  Component Value Date   NA 139 09/20/2020   K 4.4 09/20/2020   CO2 26 09/20/2020   GLUCOSE 89 09/20/2020   BUN 17 09/20/2020   CREATININE 1.74 (H) 09/20/2020   BILITOT 0.4 06/29/2020   ALKPHOS 132 (H) 10/21/2018   AST 21 06/29/2020   ALT 13 06/29/2020   PROT 6.9 06/29/2020   ALBUMIN 2.6 (L) 10/21/2018   CALCIUM 9.9 09/20/2020   ANIONGAP 11 10/29/2018   Lab Results  Component Value Date   CHOL 191 06/29/2020   Lab Results  Component Value Date   HDL 39 (L) 06/29/2020   Lab Results  Component Value Date   LDLCALC 137 (H) 06/29/2020   Lab Results  Component Value Date   TRIG 61 06/29/2020   Lab Results  Component Value Date   CHOLHDL 4.9 06/29/2020   Lab Results  Component Value Date   HGBA1C 5.2 12/30/2019    Assessment & Plan:   1. Earlobe pain, right Earring removed from earlobe. Gauze and triple antibiotic ointment applied to site. Tolerated treatment well with no complaints. CD&I at discharge.   2. Follow up He will keep follow up appointment as scheduled.   No orders of the defined types were placed in this encounter.   No orders of the defined types were placed in this encounter.   Referral Orders  No referral(s) requested today    02/27/2020, MSN, ANE, FNP-BC [facility]/Internal Medicine/Sickle Cell Center [facility] [address] [place], [place] [phone]- fax  Problem List Items Addressed This Visit   None   Visit  Diagnoses    Earlobe pain, right    -  Primary   Follow up         No orders of the defined types were placed in this encounter.   Follow-up: No follow-ups on file.    [phone], FNP

## 2020-11-22 ENCOUNTER — Ambulatory Visit (INDEPENDENT_AMBULATORY_CARE_PROVIDER_SITE_OTHER): Payer: Self-pay | Admitting: Family Medicine

## 2020-11-22 ENCOUNTER — Encounter: Payer: Self-pay | Admitting: Family Medicine

## 2020-11-22 ENCOUNTER — Telehealth: Payer: Self-pay

## 2020-11-22 ENCOUNTER — Other Ambulatory Visit: Payer: Self-pay

## 2020-11-22 VITALS — BP 140/76 | HR 67 | Temp 97.2°F | Ht 69.0 in | Wt 261.6 lb

## 2020-11-22 DIAGNOSIS — B20 Human immunodeficiency virus [HIV] disease: Secondary | ICD-10-CM

## 2020-11-22 DIAGNOSIS — L089 Local infection of the skin and subcutaneous tissue, unspecified: Secondary | ICD-10-CM

## 2020-11-22 DIAGNOSIS — Z09 Encounter for follow-up examination after completed treatment for conditions other than malignant neoplasm: Secondary | ICD-10-CM

## 2020-11-22 DIAGNOSIS — M79672 Pain in left foot: Secondary | ICD-10-CM

## 2020-11-22 MED ORDER — METHYLPREDNISOLONE SODIUM SUCC 125 MG IJ SOLR
125.0000 mg | Freq: Once | INTRAMUSCULAR | Status: AC
  Administered 2020-11-22: 125 mg via INTRAMUSCULAR

## 2020-11-22 MED ORDER — PREDNISONE 10 MG PO TABS: ORAL_TABLET | ORAL | 0 refills | Status: DC

## 2020-11-22 MED ORDER — DOXYCYCLINE HYCLATE 100 MG PO TABS: 100.0000 mg | ORAL_TABLET | Freq: Two times a day (BID) | ORAL | 0 refills | Status: DC

## 2020-11-22 NOTE — Progress Notes (Signed)
[facility]  Sick Visit  Subjective:  Patient ID: Alex Mitchell, male    DOB: 10-08-1981  Age: 40 y.o. MRN: [id number]  CC:  Chief Complaint  Patient presents with  . Foot Pain    Left foot pain started about aching , no injury , rash on left thigh , very painful. Think it a reaction to tea oil  notice  about 2 days .     HPI Alex Mitchell is a 40 year old male who presents for Follow Up today.   Patient Active Problem List   Diagnosis Date Noted  . Elevated serum creatinine 03/29/2020  . At risk for adverse drug interaction 03/29/2020  . BMI 40.0-44.9, adult ([facility]) 09/30/2019  . Class 2 severe obesity due to excess calories with serious comorbidity and body mass index (BMI) of 39.0 to 39.9 in adult [facility]) 09/30/2019  . Seasonal allergies 09/30/2019  . Iron deficiency anemia 05/13/2019  . Healthcare maintenance 10/31/2018  . HIV disease (HCC) 10/31/2018   Current Status: Since his last office visit, he has c/o left foot pain intermittently, which pain has increased in frequency and intensity. He has also developed a rash/skin infection on his inner left thigh, which he used home remedy on it a few days ago and site has worsened.  He continues to follow up with Infection Disease as needed. he denies fevers, chills, fatigue, recent infections, weight loss, and night sweats. He has not had any headaches, visual changes, dizziness, and falls. No chest pain, heart palpitations, cough and shortness of breath reported. Denies GI problems such as nausea, vomiting, diarrhea, and constipation. He has no reports of blood in stools, dysuria and hematuria. No depression or anxiety, and denies suicidal ideations, homicidal ideations, or auditory hallucinations. He is taking all medications as prescribed.    Past Medical History:  Diagnosis Date  . AIDS (acquired immune deficiency syndrome) (HCC) 10/31/2018  . [place]   . Mouth sores     History  reviewed. No pertinent surgical history.  Family History  Problem Relation Age of Onset  . Hypertension Other   . Hypertension Father   . Diabetes Neg Hx     Social History   Socioeconomic History  . Marital status: Single    Spouse name: Not on file  . Number of children: Not on file  . Years of education: Not on file  . Highest education level: Not on file  Occupational History  . Not on file  Tobacco Use  . Smoking status: Never Smoker  . Smokeless tobacco: Never Used  Vaping Use  . Vaping Use: Never used  Substance and Sexual Activity  . Alcohol use: Not Currently  . Drug use: No  . Sexual activity: Not Currently  Other Topics Concern  . Not on file  Social History Narrative  . Not on file   Social Determinants of Health   Financial Resource Strain: Not on file  Food Insecurity: Not on file  Transportation Needs: Not on file  Physical Activity: Not on file  Stress: Not on file  Social Connections: Not on file  Intimate Partner Violence: Not on file    Outpatient Medications Prior to Visit  Medication Sig Dispense Refill  . BIKTARVY 50-200-25 MG TABS tablet TAKE 1 TABLET BY MOUTH DAILY 30 tablet 4  . cetirizine (ZYRTEC) 10 MG tablet Take 1 tablet (10 mg total) by mouth daily. 30 tablet 11  . Cranberry 250 MG CHEW  Chew by mouth.    . ferrous sulfate 325 (65 FE) MG tablet Take 325 mg by mouth daily with breakfast.     No facility-administered medications prior to visit.    No Known Allergies  ROS Review of Systems  Constitutional: Negative.   HENT: Negative.   Eyes: Negative.   Respiratory: Negative.   Cardiovascular: Negative.   Gastrointestinal: Negative.   Endocrine: Negative.   Genitourinary: Negative.   Musculoskeletal: Positive for arthralgias (left foot pain).       Left foot pain   Skin: Negative.   Allergic/Immunologic: Negative.   Neurological: Positive for dizziness (occasional ) and headaches (occasional ).  Hematological: Negative.    Psychiatric/Behavioral: Negative.     Objective:    Physical Exam Vitals and nursing note reviewed.  Constitutional:      Appearance: Normal appearance.  HENT:     Head: Normocephalic and atraumatic.     Nose: Nose normal.     Mouth/Throat:     Mouth: Mucous membranes are moist.     Pharynx: Oropharynx is clear.  Cardiovascular:     Rate and Rhythm: Normal rate and regular rhythm.     Pulses: Normal pulses.     Heart sounds: Normal heart sounds.  Pulmonary:     Effort: Pulmonary effort is normal.     Breath sounds: Normal breath sounds.  Abdominal:     General: Bowel sounds are normal.     Palpations: Abdomen is soft.  Musculoskeletal:     Cervical back: Normal range of motion and neck supple.     Comments: Decrease ROM in left foot  Skin:    General: Skin is warm and dry.  Neurological:     General: No focal deficit present.     Mental Status: He is alert and oriented to person, place, and time.  Psychiatric:        Mood and Affect: Mood normal.        Behavior: Behavior normal.        Thought Content: Thought content normal.        Judgment: Judgment normal.     BP 140/76 (BP Location: Left Arm, Patient Position: Sitting, Cuff Size: Large)   Pulse 67   Temp (!) 97.2 F (36.2 C)   Ht 5\' 9"  (1.753 m)   Wt 261 lb 9.6 oz (118.7 kg)   SpO2 98%   BMI 38.63 kg/m  Wt Readings from Last 3 Encounters:  11/22/20 261 lb 9.6 oz (118.7 kg)  11/02/20 262 lb (118.8 kg)  10/11/20 271 lb (122.9 kg)     There are no preventive care reminders to display for this patient.  There are no preventive care reminders to display for this patient.  Lab Results  Component Value Date   TSH 1.030 10/21/2018   Lab Results  Component Value Date   WBC 7.2 06/29/2020   HGB 14.2 07/20/2020   HCT 40.9 07/20/2020   MCV 96.2 06/29/2020   PLT 177 06/29/2020   Lab Results  Component Value Date   NA 139 09/20/2020   K 4.4 09/20/2020   CO2 26 09/20/2020   GLUCOSE 89 09/20/2020    BUN 17 09/20/2020   CREATININE 1.74 (H) 09/20/2020   BILITOT 0.4 06/29/2020   ALKPHOS 132 (H) 10/21/2018   AST 21 06/29/2020   ALT 13 06/29/2020   PROT 6.9 06/29/2020   ALBUMIN 2.6 (L) 10/21/2018   CALCIUM 9.9 09/20/2020   ANIONGAP 11 10/29/2018   Lab Results  Component Value Date   CHOL 191 06/29/2020   Lab Results  Component Value Date   HDL 39 (L) 06/29/2020   Lab Results  Component Value Date   LDLCALC 137 (H) 06/29/2020   Lab Results  Component Value Date   TRIG 61 06/29/2020   Lab Results  Component Value Date   CHOLHDL 4.9 06/29/2020   Lab Results  Component Value Date   HGBA1C 5.2 12/30/2019      Assessment & Plan:   1. Left foot pain We will initiate steroid today.  - predniSONE (DELTASONE) 10 MG tablet; Day #1: Take 6 tablets by mouth Day #2: Take 5 tablets by mouth Day #3: Take 4 tablets by mouth Day #4: Take 3 tablets by mouth Day #5: Take 2 tablets by mouth Day #6: Take 1 tablet, then complete.  Dispense: 21 tablet; Refill: 0 - methylPREDNISolone sodium succinate (SOLU-MEDROL) 125 mg/2 mL injection 125 mg  2. Skin infection We will initiate antibiotic today for skin infection. Monitor.  - doxycycline (VIBRA-TABS) 100 MG tablet; Take 1 tablet (100 mg total) by mouth 2 (two) times daily.  Dispense: 20 tablet; Refill: 0  3. HIV disease (HCC) Continue to follow up with Infection Disease as needed.   4. Follow up He will follow up in 2 months.    Meds ordered this encounter  Medications  . doxycycline (VIBRA-TABS) 100 MG tablet    Sig: Take 1 tablet (100 mg total) by mouth 2 (two) times daily.    Dispense:  20 tablet    Refill:  0  . predniSONE (DELTASONE) 10 MG tablet    Sig: Day #1: Take 6 tablets by mouth Day #2: Take 5 tablets by mouth Day #3: Take 4 tablets by mouth Day #4: Take 3 tablets by mouth Day #5: Take 2 tablets by mouth Day #6: Take 1 tablet, then complete.    Dispense:  21 tablet    Refill:  0  . methylPREDNISolone  sodium succinate (SOLU-MEDROL) 125 mg/2 mL injection 125 mg    No orders of the defined types were placed in this encounter.   Referral Orders  No referral(s) requested today    Raliegh Ip, MSN, ANE, FNP-BC [facility]/Internal Medicine/Sickle Cell Center [facility] [address] [place], [place] [address] [phone]- fax   Problem List Items Addressed This Visit      Other   HIV disease (HCC) (Chronic)    Other Visit Diagnoses    Left foot pain    -  Primary   Relevant Medications   predniSONE (DELTASONE) 10 MG tablet   methylPREDNISolone sodium succinate (SOLU-MEDROL) 125 mg/2 mL injection 125 mg (Completed)   Skin infection       Relevant Medications   doxycycline (VIBRA-TABS) 100 MG tablet   Follow up          Meds ordered this encounter  Medications  . doxycycline (VIBRA-TABS) 100 MG tablet    Sig: Take 1 tablet (100 mg total) by mouth 2 (two) times daily.    Dispense:  20 tablet    Refill:  0  . predniSONE (DELTASONE) 10 MG tablet    Sig: Day #1: Take 6 tablets by mouth Day #2: Take 5 tablets by mouth Day #3: Take 4 tablets by mouth Day #4: Take 3 tablets by mouth Day #5: Take 2 tablets by mouth Day #6: Take 1 tablet, then complete.    Dispense:  21 tablet  Refill:  0  . methylPREDNISolone sodium succinate (SOLU-MEDROL) 125 mg/2 mL injection 125 mg    Follow-up: No follow-ups on file.    Kallie Locks, FNP

## 2020-11-22 NOTE — Telephone Encounter (Signed)
Received another voicemail from patient. RN called patient back, no answer. Left HIPAA compliant voicemail requesting call back.   Sandie Ano, RN

## 2020-11-22 NOTE — Telephone Encounter (Signed)
Received voicemail from patient requesting call back. He states he has had "pressure pain" in the bottom of his foot and "another situation" he needs to talk about. RN returned call, no answer. Left HIPAA compliant message requesting call back.   Sandie Ano, RN

## 2020-11-22 NOTE — Patient Instructions (Addendum)
Ibuprofen tablets and capsules What is this medicine? IBUPROFEN (eye BYOO proe fen) is a non-steroidal anti-inflammatory drug (NSAID). It is used for dental pain, fever, headaches or migraines, osteoarthritis, rheumatoid arthritis, or painful monthly periods. It can also relieve minor aches and pains caused by a cold, flu, or sore throat. This medicine may be used for other purposes; ask your health care provider or pharmacist if you have questions. COMMON BRAND NAME(S): Advil, Advil Junior Strength, Advil Migraine, Genpril, Ibren, IBU, Ibupak, Midol, Midol Cramps and Body Aches, Motrin, Motrin IB, Motrin Junior Strength, Motrin Migraine Pain, Samson-8, Toxicology Saliva Collection What should I tell my health care provider before I take this medicine? They need to know if you have any of these conditions:  cigarette smoker  coronary artery bypass graft (CABG) surgery within the past 2 weeks  drink more than 3 alcohol-containing drinks a day  heart disease  high blood pressure  history of stomach bleeding  kidney disease  liver disease  lung or breathing disease, like asthma  an unusual or allergic reaction to ibuprofen, aspirin, other NSAIDs, other medicines, foods, dyes, or preservatives  pregnant or trying to get pregnant  breast-feeding How should I use this medicine? Take this medicine by mouth with a glass of water. Follow the directions on the prescription label. Take this medicine with food if your stomach gets upset. Try to not lie down for at least 10 minutes after you take the medicine. Take your medicine at regular intervals. Do not take your medicine more often than directed. A special MedGuide will be given to you by the pharmacist with each prescription and refill. Be sure to read this information carefully each time. Talk to your pediatrician regarding the use of this medicine in children. Special care may be needed. Overdosage: If you think you have taken too much  of this medicine contact a poison control center or emergency room at once. NOTE: This medicine is only for you. Do not share this medicine with others. What if I miss a dose? If you miss a dose, take it as soon as you can. If it is almost time for your next dose, take only that dose. Do not take double or extra doses. What may interact with this medicine? Do not take this medicine with any of the following medications:  cidofovir  ketorolac  methotrexate  pemetrexed This medicine may also interact with the following medications:  alcohol  aspirin  diuretics  lithium  other drugs for inflammation like prednisone  warfarin This list may not describe all possible interactions. Give your health care provider a list of all the medicines, herbs, non-prescription drugs, or dietary supplements you use. Also tell them if you smoke, drink alcohol, or use illegal drugs. Some items may interact with your medicine. What should I watch for while using this medicine? Tell your doctor or healthcare provider if your symptoms do not start to get better or if they get worse. This medicine may cause serious skin reactions. They can happen weeks to months after starting the medicine. Contact your healthcare provider right away if you notice fevers or flu-like symptoms with a rash. The rash may be red or purple and then turn into blisters or peeling of the skin. Or, you might notice a red rash with swelling of the face, lips or lymph nodes in your neck or under your arms. This medicine does not prevent heart attack or stroke. In fact, this medicine may increase the chance of a  heart attack or stroke. The chance may increase with longer use of this medicine and in people who have heart disease. If you take aspirin to prevent heart attack or stroke, talk with your doctor or healthcare provider. Do not take other medicines that contain aspirin, ibuprofen, or naproxen with this medicine. Side effects such as  stomach upset, nausea, or ulcers may be more likely to occur. Many medicines available without a prescription should not be taken with this medicine. This medicine can cause ulcers and bleeding in the stomach and intestines at any time during treatment. Ulcers and bleeding can happen without warning symptoms and can cause death. To reduce your risk, do not smoke cigarettes or drink alcohol while you are taking this medicine. You may get drowsy or dizzy. Do not drive, use machinery, or do anything that needs mental alertness until you know how this medicine affects you. Do not stand or sit up quickly, especially if you are an older patient. This reduces the risk of dizzy or fainting spells. This medicine can cause you to bleed more easily. Try to avoid damage to your teeth and gums when you brush or floss your teeth. This medicine may be used to treat migraines. If you take migraine medicines for 10 or more days a month, your migraines may get worse. Keep a diary of headache days and medicine use. Contact your healthcare provider if your migraine attacks occur more frequently. What side effects may I notice from receiving this medicine? Side effects that you should report to your doctor or health care professional as soon as possible:  allergic reactions like skin rash, itching or hives, swelling of the face, lips, or tongue  redness, blistering, peeling or loosening of the skin, including inside the mouth  severe stomach pain  signs and symptoms of bleeding such as bloody or black, tarry stools; red or dark-brown urine; spitting up blood or brown material that looks like coffee grounds; red spots on the skin; unusual bruising or bleeding from the eye, gums, or nose  signs and symptoms of a blood clot such as changes in vision; chest pain; severe, sudden headache; trouble speaking; sudden numbness or weakness of the face, arm, or leg  unexplained weight gain or swelling  unusually weak or  tired  yellowing of eyes or skin Side effects that usually do not require medical attention (report to your doctor or health care professional if they continue or are bothersome):  bruising  diarrhea  dizziness, drowsiness  headache  nausea, vomiting This list may not describe all possible side effects. Call your doctor for medical advice about side effects. You may report side effects to FDA at 1-800-FDA-1088. Where should I keep my medicine? Keep out of the reach of children. Store at room temperature between 15 and 30 degrees C (59 and 86 degrees F). Keep container tightly closed. Throw away any unused medicine after the expiration date. NOTE: This sheet is a summary. It may not cover all possible information. If you have questions about this medicine, talk to your doctor, pharmacist, or health care provider.  2020 Elsevier/Gold Standard (2019-01-21 14:11:00) Prednisone tablets What is this medicine? PREDNISONE (PRED ni sone) is a corticosteroid. It is commonly used to treat inflammation of the skin, joints, lungs, and other organs. Common conditions treated include asthma, allergies, and arthritis. It is also used for other conditions, such as blood disorders and diseases of the adrenal glands. This medicine may be used for other purposes; ask your health care  provider or pharmacist if you have questions. COMMON BRAND NAME(S): Deltasone, Predone, Sterapred, Sterapred DS What should I tell my health care provider before I take this medicine? They need to know if you have any of these conditions:  Cushing's syndrome  diabetes  glaucoma  heart disease  high blood pressure  infection (especially a virus infection such as chickenpox, cold sores, or herpes)  kidney disease  liver disease  mental illness  myasthenia gravis  osteoporosis  seizures  stomach or intestine problems  thyroid disease  an unusual or allergic reaction to lactose, prednisone, other  medicines, foods, dyes, or preservatives  pregnant or trying to get pregnant  breast-feeding How should I use this medicine? Take this medicine by mouth with a glass of water. Follow the directions on the prescription label. Take this medicine with food. If you are taking this medicine once a day, take it in the morning. Do not take more medicine than you are told to take. Do not suddenly stop taking your medicine because you may develop a severe reaction. Your doctor will tell you how much medicine to take. If your doctor wants you to stop the medicine, the dose may be slowly lowered over time to avoid any side effects. Talk to your pediatrician regarding the use of this medicine in children. Special care may be needed. Overdosage: If you think you have taken too much of this medicine contact a poison control center or emergency room at once. NOTE: This medicine is only for you. Do not share this medicine with others. What if I miss a dose? If you miss a dose, take it as soon as you can. If it is almost time for your next dose, talk to your doctor or health care professional. You may need to miss a dose or take an extra dose. Do not take double or extra doses without advice. What may interact with this medicine? Do not take this medicine with any of the following medications:  metyrapone  mifepristone This medicine may also interact with the following medications:  aminoglutethimide  amphotericin B  aspirin and aspirin-like medicines  barbiturates  certain medicines for diabetes, like glipizide or glyburide  cholestyramine  cholinesterase inhibitors  cyclosporine  digoxin  diuretics  ephedrine  male hormones, like estrogens and birth control pills  isoniazid  ketoconazole  NSAIDS, medicines for pain and inflammation, like ibuprofen or naproxen  phenytoin  rifampin  toxoids  vaccines  warfarin This list may not describe all possible interactions. Give your  health care provider a list of all the medicines, herbs, non-prescription drugs, or dietary supplements you use. Also tell them if you smoke, drink alcohol, or use illegal drugs. Some items may interact with your medicine. What should I watch for while using this medicine? Visit your doctor or health care professional for regular checks on your progress. If you are taking this medicine over a prolonged period, carry an identification card with your name and address, the type and dose of your medicine, and your doctor's name and address. This medicine may increase your risk of getting an infection. Tell your doctor or health care professional if you are around anyone with measles or chickenpox, or if you develop sores or blisters that do not heal properly. If you are going to have surgery, tell your doctor or health care professional that you have taken this medicine within the last twelve months. Ask your doctor or health care professional about your diet. You may need to lower  the amount of salt you eat. This medicine may increase blood sugar. Ask your healthcare provider if changes in diet or medicines are needed if you have diabetes. What side effects may I notice from receiving this medicine? Side effects that you should report to your doctor or health care professional as soon as possible:  allergic reactions like skin rash, itching or hives, swelling of the face, lips, or tongue  changes in emotions or moods  changes in vision  depressed mood  eye pain  fever or chills, cough, sore throat, pain or difficulty passing urine  signs and symptoms of high blood sugar such as being more thirsty or hungry or having to urinate more than normal. You may also feel very tired or have blurry vision.  swelling of ankles, feet Side effects that usually do not require medical attention (report to your doctor or health care professional if they continue or are bothersome):  confusion, excitement,  restlessness  headache  nausea, vomiting  skin problems, acne, thin and shiny skin  trouble sleeping  weight gain This list may not describe all possible side effects. Call your doctor for medical advice about side effects. You may report side effects to FDA at 1-800-FDA-1088. Where should I keep my medicine? Keep out of the reach of children. Store at room temperature between 15 and 30 degrees C (59 and 86 degrees F). Protect from light. Keep container tightly closed. Throw away any unused medicine after the expiration date. NOTE: This sheet is a summary. It may not cover all possible information. If you have questions about this medicine, talk to your doctor, pharmacist, or health care provider.  2020 Elsevier/Gold Standard (2018-08-05 10:54:22) Doxycycline tablets or capsules What is this medicine? DOXYCYCLINE (dox i SYE kleen) is a tetracycline antibiotic. It kills certain bacteria or stops their growth. It is used to treat many kinds of infections, like dental, skin, respiratory, and urinary tract infections. It also treats acne, Lyme disease, malaria, and certain sexually transmitted infections. This medicine may be used for other purposes; ask your health care provider or pharmacist if you have questions. COMMON BRAND NAME(S): Acticlate, Adoxa, Adoxa CK, Adoxa Pak, Adoxa TT, Alodox, Avidoxy, Doxal, LYMEPAK, Mondoxyne NL, Monodox, Morgidox 1x, Morgidox 1x Kit, Morgidox 2x, Morgidox 2x Kit, NutriDox, Ocudox, [place], [place], Vibra-Tabs, Vibramycin What should I tell my health care provider before I take this medicine? They need to know if you have any of these conditions:  liver disease  long exposure to sunlight like working outdoors  stomach problems like colitis  an unusual or allergic reaction to doxycycline, tetracycline antibiotics, other medicines, foods, dyes, or preservatives  pregnant or trying to get pregnant  breast-feeding How should I use this medicine? Take  this medicine by mouth with a full glass of water. Follow the directions on the prescription label. It is best to take this medicine without food, but if it upsets your stomach take it with food. Take your medicine at regular intervals. Do not take your medicine more often than directed. Take all of your medicine as directed even if you think you are better. Do not skip doses or stop your medicine early. Talk to your pediatrician regarding the use of this medicine in children. While this drug may be prescribed for selected conditions, precautions do apply. Overdosage: If you think you have taken too much of this medicine contact a poison control center or emergency room at once. NOTE: This medicine is only for you. Do not share this  medicine with others. What if I miss a dose? If you miss a dose, take it as soon as you can. If it is almost time for your next dose, take only that dose. Do not take double or extra doses. What may interact with this medicine?  antacids  barbiturates  birth control pills  bismuth subsalicylate  carbamazepine  methoxyflurane  other antibiotics  phenytoin  vitamins that contain iron  warfarin This list may not describe all possible interactions. Give your health care provider a list of all the medicines, herbs, non-prescription drugs, or dietary supplements you use. Also tell them if you smoke, drink alcohol, or use illegal drugs. Some items may interact with your medicine. What should I watch for while using this medicine? Tell your doctor or health care professional if your symptoms do not improve. Do not treat diarrhea with over the counter products. Contact your doctor if you have diarrhea that lasts more than 2 days or if it is severe and watery. Do not take this medicine just before going to bed. It may not dissolve properly when you lay down and can cause pain in your throat. Drink plenty of fluids while taking this medicine to also help reduce  irritation in your throat. This medicine can make you more sensitive to the sun. Keep out of the sun. If you cannot avoid being in the sun, wear protective clothing and use sunscreen. Do not use sun lamps or tanning beds/booths. Birth control pills may not work properly while you are taking this medicine. Talk to your doctor about using an extra method of birth control. If you are being treated for a sexually transmitted infection, avoid sexual contact until you have finished your treatment. Your sexual partner may also need treatment. Avoid antacids, aluminum, calcium, magnesium, and iron products for 4 hours before and 2 hours after taking a dose of this medicine. If you are using this medicine to prevent malaria, you should still protect yourself from contact with mosquitos. Stay in screened-in areas, use mosquito nets, keep your body covered, and use an insect repellent. What side effects may I notice from receiving this medicine? Side effects that you should report to your doctor or health care professional as soon as possible:  allergic reactions like skin rash, itching or hives, swelling of the face, lips, or tongue  difficulty breathing  fever  itching in the rectal or genital area  pain on swallowing  rash, fever, and swollen lymph nodes  redness, blistering, peeling or loosening of the skin, including inside the mouth  severe stomach pain or cramps  unusual bleeding or bruising  unusually weak or tired  yellowing of the eyes or skin Side effects that usually do not require medical attention (report to your doctor or health care professional if they continue or are bothersome):  diarrhea  loss of appetite  nausea, vomiting This list may not describe all possible side effects. Call your doctor for medical advice about side effects. You may report side effects to FDA at 1-800-FDA-1088. Where should I keep my medicine? Keep out of the reach of children. Store at room  temperature, below 30 degrees C (86 degrees F). Protect from light. Keep container tightly closed. Throw away any unused medicine after the expiration date. Taking this medicine after the expiration date can make you seriously ill. NOTE: This sheet is a summary. It may not cover all possible information. If you have questions about this medicine, talk to your doctor, pharmacist, or health  care provider.  2020 Elsevier/Gold Standard (2019-02-05 13:44:53) Methylprednisolone Solution for Injection What is this medicine? METHYLPREDNISOLONE (meth ill pred NISS oh lone) is a corticosteroid. It is commonly used to treat inflammation of the skin, joints, lungs, and other organs. Common conditions treated include asthma, allergies, and arthritis. It is also used for other conditions, such as blood disorders and diseases of the adrenal glands. This medicine may be used for other purposes; ask your health care provider or pharmacist if you have questions. COMMON BRAND NAME(S): A-Methapred, Solu-Medrol What should I tell my health care provider before I take this medicine? They need to know if you have any of these conditions:  Cushing's syndrome  eye disease, vision problems  diabetes  glaucoma  heart disease  high blood pressure  infection (especially a virus infection such as chickenpox, cold sores, or herpes)  liver disease  mental illness  myasthenia gravis  osteoporosis  recently received or scheduled to receive a vaccine  seizures  stomach or intestine problems  thyroid disease  an unusual or allergic reaction to lactose, methylprednisolone, other medicines, foods, dyes, or preservatives  pregnant or trying to get pregnant  breast-feeding How should I use this medicine? This medicine is for injection or infusion into a vein. It is also for injection into a muscle. It is given by a health care professional in a hospital or clinic setting. Talk to your pediatrician regarding  the use of this medicine in children. While this drug may be prescribed for selected conditions, precautions do apply. Overdosage: If you think you have taken too much of this medicine contact a poison control center or emergency room at once. NOTE: This medicine is only for you. Do not share this medicine with others. What if I miss a dose? This does not apply. What may interact with this medicine? Do not take this medicine with any of the following medications:  alefacept  echinacea  iopamidol  live virus vaccines  metyrapone  mifepristone This medicine may also interact with the following medications:  amphotericin B  aspirin and aspirin-like medicines  certain antibiotics like erythromycin, clarithromycin, troleandomycin  certain medicines for diabetes  certain medicines for fungal infection like ketoconazole  certain medicines for seizures like carbamazepine, phenobarbital, phenytoin  certain medicines that treat or prevent blood clots like warfarin  cyclosporine  digoxin  diuretics  male hormones, like estrogens and birth control pills  isoniazid  NSAIDS, medicines for pain and inflammation, like ibuprofen or naproxen  other medicines for myasthenia gravis  rifampin  vaccines This list may not describe all possible interactions. Give your health care provider a list of all the medicines, herbs, non-prescription drugs, or dietary supplements you use. Also tell them if you smoke, drink alcohol, or use illegal drugs. Some items may interact with your medicine. What should I watch for while using this medicine? Tell your doctor or healthcare professional if your symptoms do not start to get better or if they get worse. Do not stop taking except on your doctor's advice. You may develop a severe reaction. Your doctor will tell you how much medicine to take. Your condition will be monitored carefully while you are receiving this medicine. This medicine may  increase your risk of getting an infection. Tell your doctor or health care professional if you are around anyone with measles or chickenpox, or if you develop sores or blisters that do not heal properly. This medicine may increase blood sugar. Ask your healthcare provider if changes in diet  or medicines are needed if you have diabetes. Tell your doctor or health care professional right away if you have any change in your eyesight. Using this medicine for a long time may increase your risk of low bone mass. Talk to your doctor about bone health. What side effects may I notice from receiving this medicine? Side effects that you should report to your doctor or health care professional as soon as possible:  allergic reactions like skin rash, itching or hives, swelling of the face, lips, or tongue  bloody or tarry stools  hallucination, loss of contact with reality  muscle cramps  muscle pain  palpitations  signs and symptoms of high blood sugar such as being more thirsty or hungry or having to urinate more than normal. You may also feel very tired or have blurry vision.  signs and symptoms of infection like fever or chills; cough; sore throat; pain or trouble passing urine  trouble passing urine Side effects that usually do not require medical attention (report to your doctor or health care professional if they continue or are bothersome):  changes in emotions or mood  constipation  diarrhea  excessive hair growth on the face or body  headache  nausea, vomiting  pain, redness, or irritation at site where injected  trouble sleeping  weight gain This list may not describe all possible side effects. Call your doctor for medical advice about side effects. You may report side effects to FDA at 1-800-FDA-1088. Where should I keep my medicine? This drug is given in a hospital or clinic and will not be stored at home. NOTE: This sheet is a summary. It may not cover all possible  information. If you have questions about this medicine, talk to your doctor, pharmacist, or health care provider.  2020 Elsevier/Gold Standard (2018-08-07 09:12:19) Ibuprofen tablets and capsules What is this medicine? IBUPROFEN (eye BYOO proe fen) is a non-steroidal anti-inflammatory drug (NSAID). It is used for dental pain, fever, headaches or migraines, osteoarthritis, rheumatoid arthritis, or painful monthly periods. It can also relieve minor aches and pains caused by a cold, flu, or sore throat. This medicine may be used for other purposes; ask your health care provider or pharmacist if you have questions. COMMON BRAND NAME(S): Advil, Advil Junior Strength, Advil Migraine, Genpril, Ibren, IBU, Ibupak, Midol, Midol Cramps and Body Aches, Motrin, Motrin IB, Motrin Junior Strength, Motrin Migraine Pain, Samson-8, Toxicology Saliva Collection What should I tell my health care provider before I take this medicine? They need to know if you have any of these conditions:  cigarette smoker  coronary artery bypass graft (CABG) surgery within the past 2 weeks  drink more than 3 alcohol-containing drinks a day  heart disease  high blood pressure  history of stomach bleeding  kidney disease  liver disease  lung or breathing disease, like asthma  an unusual or allergic reaction to ibuprofen, aspirin, other NSAIDs, other medicines, foods, dyes, or preservatives  pregnant or trying to get pregnant  breast-feeding How should I use this medicine? Take this medicine by mouth with a glass of water. Follow the directions on the prescription label. Take this medicine with food if your stomach gets upset. Try to not lie down for at least 10 minutes after you take the medicine. Take your medicine at regular intervals. Do not take your medicine more often than directed. A special MedGuide will be given to you by the pharmacist with each prescription and refill. Be sure to read this information  carefully  each time. Talk to your pediatrician regarding the use of this medicine in children. Special care may be needed. Overdosage: If you think you have taken too much of this medicine contact a poison control center or emergency room at once. NOTE: This medicine is only for you. Do not share this medicine with others. What if I miss a dose? If you miss a dose, take it as soon as you can. If it is almost time for your next dose, take only that dose. Do not take double or extra doses. What may interact with this medicine? Do not take this medicine with any of the following medications:  cidofovir  ketorolac  methotrexate  pemetrexed This medicine may also interact with the following medications:  alcohol  aspirin  diuretics  lithium  other drugs for inflammation like prednisone  warfarin This list may not describe all possible interactions. Give your health care provider a list of all the medicines, herbs, non-prescription drugs, or dietary supplements you use. Also tell them if you smoke, drink alcohol, or use illegal drugs. Some items may interact with your medicine. What should I watch for while using this medicine? Tell your doctor or healthcare provider if your symptoms do not start to get better or if they get worse. This medicine may cause serious skin reactions. They can happen weeks to months after starting the medicine. Contact your healthcare provider right away if you notice fevers or flu-like symptoms with a rash. The rash may be red or purple and then turn into blisters or peeling of the skin. Or, you might notice a red rash with swelling of the face, lips or lymph nodes in your neck or under your arms. This medicine does not prevent heart attack or stroke. In fact, this medicine may increase the chance of a heart attack or stroke. The chance may increase with longer use of this medicine and in people who have heart disease. If you take aspirin to prevent heart  attack or stroke, talk with your doctor or healthcare provider. Do not take other medicines that contain aspirin, ibuprofen, or naproxen with this medicine. Side effects such as stomach upset, nausea, or ulcers may be more likely to occur. Many medicines available without a prescription should not be taken with this medicine. This medicine can cause ulcers and bleeding in the stomach and intestines at any time during treatment. Ulcers and bleeding can happen without warning symptoms and can cause death. To reduce your risk, do not smoke cigarettes or drink alcohol while you are taking this medicine. You may get drowsy or dizzy. Do not drive, use machinery, or do anything that needs mental alertness until you know how this medicine affects you. Do not stand or sit up quickly, especially if you are an older patient. This reduces the risk of dizzy or fainting spells. This medicine can cause you to bleed more easily. Try to avoid damage to your teeth and gums when you brush or floss your teeth. This medicine may be used to treat migraines. If you take migraine medicines for 10 or more days a month, your migraines may get worse. Keep a diary of headache days and medicine use. Contact your healthcare provider if your migraine attacks occur more frequently. What side effects may I notice from receiving this medicine? Side effects that you should report to your doctor or health care professional as soon as possible:  allergic reactions like skin rash, itching or hives, swelling of the face, lips, or tongue  redness, blistering, peeling or loosening of the skin, including inside the mouth  severe stomach pain  signs and symptoms of bleeding such as bloody or black, tarry stools; red or dark-brown urine; spitting up blood or brown material that looks like coffee grounds; red spots on the skin; unusual bruising or bleeding from the eye, gums, or nose  signs and symptoms of a blood clot such as changes in  vision; chest pain; severe, sudden headache; trouble speaking; sudden numbness or weakness of the face, arm, or leg  unexplained weight gain or swelling  unusually weak or tired  yellowing of eyes or skin Side effects that usually do not require medical attention (report to your doctor or health care professional if they continue or are bothersome):  bruising  diarrhea  dizziness, drowsiness  headache  nausea, vomiting This list may not describe all possible side effects. Call your doctor for medical advice about side effects. You may report side effects to FDA at 1-800-FDA-1088. Where should I keep my medicine? Keep out of the reach of children. Store at room temperature between 15 and 30 degrees C (59 and 86 degrees F). Keep container tightly closed. Throw away any unused medicine after the expiration date. NOTE: This sheet is a summary. It may not cover all possible information. If you have questions about this medicine, talk to your doctor, pharmacist, or health care provider.  2020 Elsevier/Gold Standard (2019-01-21 14:11:00)

## 2020-11-23 ENCOUNTER — Other Ambulatory Visit (HOSPITAL_COMMUNITY): Payer: Self-pay | Admitting: Internal Medicine

## 2020-11-23 ENCOUNTER — Ambulatory Visit: Payer: Medicaid Other | Attending: Internal Medicine

## 2020-11-23 DIAGNOSIS — Z23 Encounter for immunization: Secondary | ICD-10-CM

## 2020-11-23 NOTE — Progress Notes (Signed)
   Covid-19 Vaccination Clinic  Name:  Alex Mitchell    MRN: [id number] DOB: 1981-04-22  11/23/2020  [name] was observed post Covid-19 immunization for 15 minutes without incident. He was provided with Vaccine Information Sheet and instruction to access the V-Safe system.   [name] was instructed to call 911 with any severe reactions post vaccine: Marland Kitchen Difficulty breathing  . Swelling of face and throat  . A fast heartbeat  . A bad rash all over body  . Dizziness and weakness   Immunizations Administered    Name Date Dose VIS Date Route   Pfizer COVID-19 Vaccine 11/23/2020 11:02 AM 0.3 mL 09/07/2020 Intramuscular   Manufacturer: [employer], [employer]   Lot: [id number]   NDC: [address]-0

## 2020-11-29 ENCOUNTER — Encounter: Payer: Self-pay | Admitting: Family Medicine

## 2020-12-01 ENCOUNTER — Other Ambulatory Visit: Payer: Self-pay

## 2020-12-01 ENCOUNTER — Ambulatory Visit: Payer: Self-pay

## 2021-01-09 ENCOUNTER — Encounter: Payer: Self-pay | Admitting: Infectious Diseases

## 2021-01-17 ENCOUNTER — Ambulatory Visit (INDEPENDENT_AMBULATORY_CARE_PROVIDER_SITE_OTHER): Payer: Self-pay | Admitting: Family Medicine

## 2021-01-17 ENCOUNTER — Encounter: Payer: Self-pay | Admitting: Family Medicine

## 2021-01-17 ENCOUNTER — Other Ambulatory Visit: Payer: Self-pay

## 2021-01-17 VITALS — BP 137/68 | HR 67 | Ht 69.0 in | Wt 259.0 lb

## 2021-01-17 DIAGNOSIS — M79672 Pain in left foot: Secondary | ICD-10-CM

## 2021-01-17 DIAGNOSIS — R634 Abnormal weight loss: Secondary | ICD-10-CM

## 2021-01-17 DIAGNOSIS — B20 Human immunodeficiency virus [HIV] disease: Secondary | ICD-10-CM

## 2021-01-17 DIAGNOSIS — Z6838 Body mass index (BMI) 38.0-38.9, adult: Secondary | ICD-10-CM

## 2021-01-17 DIAGNOSIS — Z09 Encounter for follow-up examination after completed treatment for conditions other than malignant neoplasm: Secondary | ICD-10-CM

## 2021-01-17 LAB — POCT URINALYSIS DIPSTICK
Bilirubin, UA: NEGATIVE
Blood, UA: NEGATIVE
Glucose, UA: NEGATIVE
Ketones, UA: NEGATIVE
Nitrite, UA: NEGATIVE
Protein, UA: NEGATIVE
Spec Grav, UA: 1.025 (ref 1.010–1.025)
Urobilinogen, UA: 0.2 E.U./dL
pH, UA: 7 (ref 5.0–8.0)

## 2021-01-17 NOTE — Progress Notes (Signed)
[facility]   Established Patient Office Visit  Subjective:  Patient ID: Alex Mitchell, male    DOB: 01-26-1981  Age: 40 y.o. MRN: [id number]  CC:  Chief Complaint  Patient presents with  . HIV Positive/AIDS    HPI Alex Mitchell is a 40 year old male who presents for Follow Up today.   Patient Active Problem List   Diagnosis Date Noted  . Elevated serum creatinine 03/29/2020  . At risk for adverse drug interaction 03/29/2020  . BMI 40.0-44.9, adult (HCC) 09/30/2019  . Class 2 severe obesity due to excess calories with serious comorbidity and body mass index (BMI) of 39.0 to 39.9 in adult [facility]) 09/30/2019  . Seasonal allergies 09/30/2019  . Iron deficiency anemia 05/13/2019  . Healthcare maintenance 10/31/2018  . HIV disease (HCC) 10/31/2018   Current Status: Since his last office visit, he is doing well with no complaints. He continues to follow with Infection Disease as needed. He denies fevers, chills, fatigue, recent infections, weight loss, and night sweats. He has not had any headaches, visual changes, dizziness, and falls. No chest pain, heart palpitations, cough and shortness of breath reported. Denies GI problems such as nausea, vomiting, diarrhea, and constipation. He has no reports of blood in stools, dysuria and hematuria. No depression or anxiety, and denies suicidal ideations, homicidal ideations, or auditory hallucinations. He is taking all medications as prescribed. He denies pain today.   Past Medical History:  Diagnosis Date  . AIDS (acquired immune deficiency syndrome) (HCC) 10/31/2018  . [place]   . Mouth sores     No past surgical history on file.  Family History  Problem Relation Age of Onset  . Hypertension Other   . Hypertension Father   . Diabetes Neg Hx     Social History   Socioeconomic History  . Marital status: Single    Spouse name: Not on file  . Number of children: Not on file  . Years  of education: Not on file  . Highest education level: Not on file  Occupational History  . Not on file  Tobacco Use  . Smoking status: Never Smoker  . Smokeless tobacco: Never Used  Vaping Use  . Vaping Use: Never used  Substance and Sexual Activity  . Alcohol use: Not Currently  . Drug use: No  . Sexual activity: Not Currently  Other Topics Concern  . Not on file  Social History Narrative  . Not on file   Social Determinants of Health   Financial Resource Strain: Not on file  Food Insecurity: Not on file  Transportation Needs: Not on file  Physical Activity: Not on file  Stress: Not on file  Social Connections: Not on file  Intimate Partner Violence: Not on file    Outpatient Medications Prior to Visit  Medication Sig Dispense Refill  . BIKTARVY 50-200-25 MG TABS tablet TAKE 1 TABLET BY MOUTH DAILY 30 tablet 4  . cetirizine (ZYRTEC) 10 MG tablet Take 1 tablet (10 mg total) by mouth daily. 30 tablet 11  . Cranberry 250 MG CHEW Chew by mouth.    . ferrous sulfate 325 (65 FE) MG tablet Take 325 mg by mouth daily with breakfast.    . doxycycline (VIBRA-TABS) 100 MG tablet Take 1 tablet (100 mg total) by mouth 2 (two) times daily. 20 tablet 0  . predniSONE (DELTASONE) 10 MG tablet Day #1: Take 6 tablets by mouth Day #2: Take 5 tablets by  mouth Day #3: Take 4 tablets by mouth Day #4: Take 3 tablets by mouth Day #5: Take 2 tablets by mouth Day #6: Take 1 tablet, then complete. 21 tablet 0   No facility-administered medications prior to visit.    No Known Allergies  ROS Review of Systems  Constitutional: Negative.   HENT: Negative.   Eyes: Negative.   Respiratory: Negative.   Cardiovascular: Negative.   Gastrointestinal: Negative.   Endocrine: Negative.   Genitourinary: Negative.   Musculoskeletal: Negative.   Skin: Negative.   Allergic/Immunologic: Negative.   Neurological: Positive for dizziness (occasional ) and headaches (occasional).  Hematological:  Negative.   Psychiatric/Behavioral: Negative.    Objective:    Physical Exam Vitals and nursing note reviewed.  Constitutional:      Appearance: Normal appearance.  HENT:     Head: Normocephalic and atraumatic.     Nose: Nose normal.     Mouth/Throat:     Mouth: Mucous membranes are moist.     Pharynx: Oropharynx is clear.  Cardiovascular:     Rate and Rhythm: Normal rate and regular rhythm.     Pulses: Normal pulses.     Heart sounds: Normal heart sounds.  Pulmonary:     Effort: Pulmonary effort is normal.     Breath sounds: Normal breath sounds.  Abdominal:     General: Bowel sounds are normal.     Palpations: Abdomen is soft.  Musculoskeletal:        General: Normal range of motion.     Cervical back: Normal range of motion and neck supple.  Skin:    General: Skin is warm and dry.  Neurological:     General: No focal deficit present.     Mental Status: He is alert.  Psychiatric:        Mood and Affect: Mood normal.        Behavior: Behavior normal.        Thought Content: Thought content normal.        Judgment: Judgment normal.     BP 137/68   Pulse 67   Ht 5\' 9"  (1.753 m)   Wt 259 lb (117.5 kg)   SpO2 100%   BMI 38.25 kg/m  Wt Readings from Last 3 Encounters:  01/17/21 259 lb (117.5 kg)  11/22/20 261 lb 9.6 oz (118.7 kg)  11/02/20 262 lb (118.8 kg)     There are no preventive care reminders to display for this patient.  There are no preventive care reminders to display for this patient.  Lab Results  Component Value Date   TSH 1.030 10/21/2018   Lab Results  Component Value Date   WBC 7.2 06/29/2020   HGB 14.2 07/20/2020   HCT 40.9 07/20/2020   MCV 96.2 06/29/2020   PLT 177 06/29/2020   Lab Results  Component Value Date   NA 139 09/20/2020   K 4.4 09/20/2020   CO2 26 09/20/2020   GLUCOSE 89 09/20/2020   BUN 17 09/20/2020   CREATININE 1.74 (H) 09/20/2020   BILITOT 0.4 06/29/2020   ALKPHOS 132 (H) 10/21/2018   AST 21 06/29/2020    ALT 13 06/29/2020   PROT 6.9 06/29/2020   ALBUMIN 2.6 (L) 10/21/2018   CALCIUM 9.9 09/20/2020   ANIONGAP 11 10/29/2018   Lab Results  Component Value Date   CHOL 191 06/29/2020   Lab Results  Component Value Date   HDL 39 (L) 06/29/2020   Lab Results  Component Value Date  LDLCALC 137 (H) 06/29/2020   Lab Results  Component Value Date   TRIG 61 06/29/2020   Lab Results  Component Value Date   CHOLHDL 4.9 06/29/2020   Lab Results  Component Value Date   HGBA1C 5.2 12/30/2019      Assessment & Plan:   1. Left foot pain Continue Prednisone as prescribed.   2. Class 2 severe obesity due to excess calories with serious comorbidity and body mass index (BMI) of 38.0 to 38.9 in adult [facility]) Body mass index is 38.25 kg/m.  Goal BMI  is <30. Encouraged efforts to reduce weight include engaging in physical activity as tolerated with goal of 150 minutes per week. Improve dietary choices and eat a meal regimen consistent with a Mediterranean or DASH diet. Reduce simple carbohydrates. Do not skip meals and eat healthy snacks throughout the day to avoid over-eating at dinner. Set a goal weight loss that is achievable for you.  3. Weight loss  4. HIV disease (HCC) Continue anti-viral medications as prescribed.   5. Follow up He will follow up 07/2021 for Annual Physical and Labwork.   No orders of the defined types were placed in this encounter.   No orders of the defined types were placed in this encounter.   Referral Orders  No referral(s) requested today    Raliegh Ip, MSN, ANE, FNP-BC [facility] [address] [place], [place] [address] [phone]- fax  Problem List Items Addressed This Visit      Other   Class 2 severe obesity due to excess calories with serious comorbidity and body mass index (BMI) of 39.0 to 39.9 in adult [facility])   HIV disease (HCC)  (Chronic)    Other Visit Diagnoses    Left foot pain    -  Primary   Weight loss       Follow up          No orders of the defined types were placed in this encounter.   Follow-up: No follow-ups on file.    Kallie Locks, FNP

## 2021-01-18 ENCOUNTER — Encounter: Payer: Self-pay | Admitting: Family Medicine

## 2021-02-02 ENCOUNTER — Other Ambulatory Visit: Payer: Self-pay | Admitting: Infectious Diseases

## 2021-02-02 DIAGNOSIS — B20 Human immunodeficiency virus [HIV] disease: Secondary | ICD-10-CM

## 2021-02-07 ENCOUNTER — Other Ambulatory Visit: Payer: Medicaid Other

## 2021-02-08 ENCOUNTER — Other Ambulatory Visit: Payer: Self-pay

## 2021-02-08 DIAGNOSIS — Z21 Asymptomatic human immunodeficiency virus [HIV] infection status: Secondary | ICD-10-CM

## 2021-02-09 LAB — T-HELPER CELL (CD4) - (RCID CLINIC ONLY)
CD4 % Helper T Cell: 12 % — ABNORMAL LOW (ref 33–65)
CD4 T Cell Abs: 322 /uL — ABNORMAL LOW (ref 400–1790)

## 2021-02-14 LAB — HIV-1 RNA QUANT-NO REFLEX-BLD
HIV 1 RNA Quant: 30 Copies/mL — ABNORMAL HIGH
HIV-1 RNA Quant, Log: 1.47 Log cps/mL — ABNORMAL HIGH

## 2021-02-14 LAB — HLA B*5701: HLA-B*5701 w/rflx HLA-B High: NEGATIVE

## 2021-02-14 LAB — HEPATITIS B SURFACE ANTIBODY,QUALITATIVE: Hep B S Ab: REACTIVE — AB

## 2021-02-14 LAB — HEPATITIS A ANTIBODY, TOTAL: Hepatitis A AB,Total: NONREACTIVE

## 2021-02-21 ENCOUNTER — Encounter: Payer: Medicaid Other | Admitting: Infectious Diseases

## 2021-02-22 ENCOUNTER — Encounter: Payer: Self-pay | Admitting: Infectious Diseases

## 2021-02-22 ENCOUNTER — Ambulatory Visit (INDEPENDENT_AMBULATORY_CARE_PROVIDER_SITE_OTHER): Payer: Self-pay | Admitting: Infectious Diseases

## 2021-02-22 ENCOUNTER — Other Ambulatory Visit: Payer: Self-pay

## 2021-02-22 VITALS — BP 137/79 | HR 69 | Temp 98.0°F | Wt 257.0 lb

## 2021-02-22 DIAGNOSIS — Z6841 Body Mass Index (BMI) 40.0 and over, adult: Secondary | ICD-10-CM

## 2021-02-22 DIAGNOSIS — R7989 Other specified abnormal findings of blood chemistry: Secondary | ICD-10-CM

## 2021-02-22 DIAGNOSIS — Z113 Encounter for screening for infections with a predominantly sexual mode of transmission: Secondary | ICD-10-CM

## 2021-02-22 DIAGNOSIS — Z23 Encounter for immunization: Secondary | ICD-10-CM

## 2021-02-22 DIAGNOSIS — B20 Human immunodeficiency virus [HIV] disease: Secondary | ICD-10-CM

## 2021-02-22 NOTE — Addendum Note (Signed)
Addended by: Harley Alto on: 02/22/2021 03:32 PM   Modules accepted: Orders

## 2021-02-22 NOTE — Assessment & Plan Note (Signed)
Unclear if tenofovir related vs other factors - no improvement on TAF sparing regimen and ultimately back on biktarvy once daily. Was not able to afford nephrology work up at this time but we are keeping an eye on things. Urinalysis, BUN and electrolytes are all normal.

## 2021-02-22 NOTE — Addendum Note (Signed)
Addended by: Tressa Busman T on: 02/22/2021 03:41 PM   Modules accepted: Orders

## 2021-02-22 NOTE — Progress Notes (Signed)
Name: Alex Mitchell  DOB: 1981-07-03 MRN: [id number] PCP: Kallie Locks, FNP    Brief Narrative:  Alex Mitchell is a 40 y.o. 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  History of OIs: PJP pna, esophageal candidiasis   Previous Regimens: . Biktarvy 09-2018    Genotypes: . Not performed in hospital    Subjective:   Chief Complaint  Patient presents with  . Follow-up    B20     HPI:   Continues to work for [employer]   Needs a dental visit - he has no immediate needs but does want routine cleanings.    Review of Systems  Constitutional: Negative for appetite change, chills, fatigue, fever and unexpected weight change.  Eyes: Negative for visual disturbance.  Respiratory: Negative for cough and shortness of breath.   Cardiovascular: Negative for chest pain and leg swelling.  Gastrointestinal: Negative for abdominal pain, diarrhea and nausea.  Genitourinary: Negative for dysuria, genital sores and penile discharge.  Musculoskeletal: Negative for joint swelling.  Skin: Negative for color change and rash.  Neurological: Negative for dizziness and headaches.  Hematological: Negative for adenopathy.  Psychiatric/Behavioral: Negative for sleep disturbance. The patient is not nervous/anxious.       Outpatient Medications Prior to Visit  Medication Sig Dispense Refill  . BIKTARVY 50-200-25 MG TABS tablet TAKE 1 TABLET BY MOUTH DAILY 30 tablet 0  . cetirizine (ZYRTEC) 10 MG tablet Take 1 tablet (10 mg total) by mouth daily. 30 tablet 11  . Cranberry 250 MG CHEW Chew by mouth.    . ferrous sulfate 325 (65 FE) MG tablet Take 325 mg by mouth daily with breakfast.    . COVID-19 mRNA vaccine, Pfizer, 30 MCG/0.3ML injection USE AS DIRECTED (Patient not taking: Reported on 02/22/2021) .3 mL 0   No facility-administered medications prior to visit.     No Known Allergies  Social History   Tobacco Use  .  Smoking status: Never Smoker  . Smokeless tobacco: Never Used  Vaping Use  . Vaping Use: Never used  Substance Use Topics  . Alcohol use: Not Currently  . Drug use: No    Social History   Substance and Sexual Activity  Sexual Activity Not Currently     Objective:   Vitals:   02/22/21 1456  BP: 137/79  Pulse: 69  Temp: 98 F (36.7 C)  TempSrc: Oral  Weight: 257 lb (116.6 kg)   Body mass index is 37.95 kg/m.   Physical Exam Constitutional:      Appearance: Normal appearance. He is not ill-appearing.  HENT:     Head: Normocephalic.     Mouth/Throat:     Mouth: Mucous membranes are moist.     Pharynx: Oropharynx is clear.  Eyes:     General: No scleral icterus. Pulmonary:     Effort: Pulmonary effort is normal.  Musculoskeletal:        General: Normal range of motion.     Cervical back: Normal range of motion.  Skin:    Coloration: Skin is not jaundiced or pale.  Neurological:     Mental Status: He is alert and oriented to person, place, and time.  Psychiatric:        Mood and Affect: Mood normal.        Judgment: Judgment normal.     Lab Results Lab Results  Component Value Date   WBC 7.2 06/29/2020   HGB  14.2 07/20/2020   HCT 40.9 07/20/2020   MCV 96.2 06/29/2020   PLT 177 06/29/2020    Lab Results  Component Value Date   CREATININE 1.74 (H) 09/20/2020   BUN 17 09/20/2020   NA 139 09/20/2020   K 4.4 09/20/2020   CL 104 09/20/2020   CO2 26 09/20/2020    Lab Results  Component Value Date   ALT 13 06/29/2020   AST 21 06/29/2020   ALKPHOS 132 (H) 10/21/2018   BILITOT 0.4 06/29/2020    Lab Results  Component Value Date   CHOL 191 06/29/2020   HDL 39 (L) 06/29/2020   LDLCALC 137 (H) 06/29/2020   TRIG 61 06/29/2020   CHOLHDL 4.9 06/29/2020   HIV 1 RNA Quant (Copies/mL)  Date Value  02/08/2021 30 (H)  09/20/2020 32 (H)  06/29/2020 74 (H)   CD4 T Cell Abs (/uL)  Date Value  02/08/2021 322 (L)  09/20/2020 316 (L)  06/29/2020 297  (L)    Assessment & Plan:   Problem List Items Addressed This Visit      Unprioritized   HIV disease (HCC) - Primary (Chronic)    Doing well with very low viral load of 30, low enough to be undetectable. CD4 continues to have some slow improvements but nadir seems to be settling out around 350. No concern for OI today or HIV related conditions.  STI screening politely declined today.  Will give menveo vaccine #1 and second dose in 2 months.  FU in 6 months with labs prior to the visit.       Relevant Orders   HIV-1 RNA quant-no reflex-bld   T-helper cell (CD4)- ([facility] clinic only)   CBC with Differential/Platelet   Elevated serum creatinine    Unclear if tenofovir related vs other factors - no improvement on TAF sparing regimen and ultimately back on biktarvy once daily. Was not able to afford nephrology work up at this time but we are keeping an eye on things. Urinalysis, BUN and electrolytes are all normal.       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 Discussed DO-IT research trial and if he is interested can be a participant looking at possible association between weight gain and integrase inhibitors.        Other Visit Diagnoses    Routine screening for STI (sexually transmitted infection)       Relevant Orders   RPR   Urine cytology ancillary only   Urinalysis      Rexene Alberts, MSN, NP-C [facility]  [email or web address] Pager: [phone] Office: [phone] [facility] Main Line: [phone]

## 2021-02-22 NOTE — Patient Instructions (Signed)
Always nice to see you!   No changes from me - keep up with your biktarvy and read over the consent for the research study.  Totally up to you if you want to participate or not.   Would like to see you back in 6 months - will repeat your kidney function at that time as well.   We gave you your first dose of meningitis vaccine today - please schedule a quick nurse visit for 2 months to get your second dose, then you are done for 5 years!  Hope you have a great summer!

## 2021-02-22 NOTE — Assessment & Plan Note (Signed)
Doing well with very low viral load of 30, low enough to be undetectable. CD4 continues to have some slow improvements but nadir seems to be settling out around 350. No concern for OI today or HIV related conditions.  STI screening politely declined today.  Will give menveo vaccine #1 and second dose in 2 months.  FU in 6 months with labs prior to the visit.

## 2021-02-22 NOTE — Assessment & Plan Note (Signed)
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 Discussed DO-IT research trial and if he is interested can be a participant looking at possible association between weight gain and integrase inhibitors.

## 2021-03-01 ENCOUNTER — Encounter: Payer: Self-pay | Admitting: *Deleted

## 2021-03-01 ENCOUNTER — Other Ambulatory Visit: Payer: Self-pay

## 2021-03-01 VITALS — HR 56 | Temp 98.3°F | Ht 69.0 in | Wt 256.5 lb

## 2021-03-01 DIAGNOSIS — Z006 Encounter for examination for normal comparison and control in clinical research program: Secondary | ICD-10-CM

## 2021-03-01 NOTE — Research (Signed)
Vrishank was here for screening for the [id number] Do-It study. He had reviewed the consent at home before coming in today and was able to verbalize the reasons for doing the study and his responsibilities on study. 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 He has been on Biktarvy since then. Entry is planned for 4/28 if he is eligible.

## 2021-03-04 LAB — COMPREHENSIVE METABOLIC PANEL
AG Ratio: 1.7 (calc) (ref 1.0–2.5)
ALT: 8 U/L — ABNORMAL LOW (ref 9–46)
AST: 16 U/L (ref 10–40)
Albumin: 4.5 g/dL (ref 3.6–5.1)
Alkaline phosphatase (APISO): 54 U/L (ref 36–130)
BUN/Creatinine Ratio: 9 (calc) (ref 6–22)
BUN: 16 mg/dL (ref 7–25)
CO2: 26 mmol/L (ref 20–32)
Calcium: 9.7 mg/dL (ref 8.6–10.3)
Chloride: 103 mmol/L (ref 98–110)
Creat: 1.82 mg/dL — ABNORMAL HIGH (ref 0.60–1.35)
Globulin: 2.7 g/dL (calc) (ref 1.9–3.7)
Glucose, Bld: 97 mg/dL (ref 65–99)
Potassium: 4.2 mmol/L (ref 3.5–5.3)
Sodium: 137 mmol/L (ref 135–146)
Total Bilirubin: 0.3 mg/dL (ref 0.2–1.2)
Total Protein: 7.2 g/dL (ref 6.1–8.1)

## 2021-03-04 LAB — HIV-1 RNA QUANT-NO REFLEX-BLD
HIV 1 RNA Quant: 25 Copies/mL — ABNORMAL HIGH
HIV-1 RNA Quant, Log: 1.39 Log cps/mL — ABNORMAL HIGH

## 2021-03-04 LAB — BILIRUBIN, DIRECT: Bilirubin, Direct: 0.1 mg/dL (ref 0.0–0.2)

## 2021-03-04 LAB — PHOSPHORUS: Phosphorus: 3.2 mg/dL (ref 2.5–4.5)

## 2021-03-10 ENCOUNTER — Other Ambulatory Visit: Payer: Self-pay | Admitting: Infectious Diseases

## 2021-03-10 DIAGNOSIS — B20 Human immunodeficiency virus [HIV] disease: Secondary | ICD-10-CM

## 2021-03-16 ENCOUNTER — Encounter: Payer: Medicaid Other | Admitting: *Deleted

## 2021-03-30 ENCOUNTER — Encounter: Payer: Self-pay | Admitting: Infectious Diseases

## 2021-03-30 ENCOUNTER — Ambulatory Visit (INDEPENDENT_AMBULATORY_CARE_PROVIDER_SITE_OTHER): Payer: Self-pay | Admitting: Infectious Diseases

## 2021-03-30 ENCOUNTER — Other Ambulatory Visit: Payer: Self-pay

## 2021-03-30 VITALS — BP 145/85 | HR 63 | Resp 16 | Ht 69.0 in | Wt 250.0 lb

## 2021-03-30 DIAGNOSIS — G629 Polyneuropathy, unspecified: Secondary | ICD-10-CM | POA: Insufficient documentation

## 2021-03-30 DIAGNOSIS — B20 Human immunodeficiency virus [HIV] disease: Secondary | ICD-10-CM

## 2021-03-30 DIAGNOSIS — R52 Pain, unspecified: Secondary | ICD-10-CM

## 2021-03-30 DIAGNOSIS — M79604 Pain in right leg: Secondary | ICD-10-CM

## 2021-03-30 MED ORDER — TIZANIDINE HCL 4 MG PO TABS
4.0000 mg | ORAL_TABLET | Freq: Four times a day (QID) | ORAL | 0 refills | Status: DC | PRN
  Filled 2021-03-30: qty 5, 2d supply, fill #0

## 2021-03-30 MED ORDER — CELECOXIB 200 MG PO CAPS
200.0000 mg | ORAL_CAPSULE | Freq: Two times a day (BID) | ORAL | 0 refills | Status: DC
  Filled 2021-03-30: qty 30, 15d supply, fill #0

## 2021-03-30 NOTE — Progress Notes (Signed)
Name: Alex Mitchell  DOB: May 02, 1981 MRN: [id number] PCP: Pcp, No    Brief Narrative:  Alex Mitchell is a 40 y.o. 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  History of OIs: PJP pna, esophageal candidiasis   Previous Regimens: . Biktarvy 09-2018    Genotypes: . Not performed in hospital    Subjective:   Chief Complaint  Patient presents with  . Follow-up     HPI:  Alex Mitchell is here to have me look at his right leg. For the last 1 week he has had increased tenderness and throbbing pain espeacilaly at night from his right anterior/lateral ankle up behind the calf and to the upper posterior thigh. He says nothing is wrong with the knee or hip joint and has not had any back injury at work and no back pain. Feels "tender and tight" but not swollen or warm. He has been working in the same [employer] for a long time now. Has not tried much at home for the pain. He is having a hard time with work activities due to the pain.  He is pleased he lost some weight again.    Review of Systems  Constitutional: Positive for activity change.  Cardiovascular: Negative for leg swelling.  Gastrointestinal: Negative.   Musculoskeletal: Positive for myalgias. Negative for back pain.  Neurological: Negative for weakness and numbness.  Hematological: Negative for adenopathy.      Outpatient Medications Prior to Visit  Medication Sig Dispense Refill  . BIKTARVY 50-200-25 MG TABS tablet TAKE 1 TABLET BY MOUTH DAILY 30 tablet 5  . cetirizine (ZYRTEC) 10 MG tablet Take 1 tablet (10 mg total) by mouth daily. 30 tablet 11  . Cranberry 250 MG CHEW Chew by mouth.    . ferrous sulfate 325 (65 FE) MG tablet Take 325 mg by mouth daily with breakfast.    . COVID-19 mRNA vaccine, Pfizer, 30 MCG/0.3ML injection USE AS DIRECTED (Patient not taking: Reported on 02/22/2021) .3 mL 0   No facility-administered medications prior to visit.      No Known Allergies  Social History   Tobacco Use  . Smoking status: Never Smoker  . Smokeless tobacco: Never Used  Vaping Use  . Vaping Use: Never used  Substance Use Topics  . Alcohol use: Not Currently  . Drug use: No    Social History   Substance and Sexual Activity  Sexual Activity Not Currently     Objective:   Vitals:   03/30/21 1005  BP: (!) 145/85  Pulse: 63  Resp: 16  SpO2: 100%  Weight: 250 lb (113.4 kg)  Height: 5\' 9"  (1.753 m)   Body mass index is 36.92 kg/m.   Physical Exam Vitals reviewed.  Constitutional:      Appearance: Normal appearance. He is not ill-appearing.  Cardiovascular:     Rate and Rhythm: Normal rate.  Pulmonary:     Effort: Pulmonary effort is normal.  Musculoskeletal:        General: No swelling.       Legs:  Skin:    General: Skin is warm and dry.  Neurological:     Mental Status: He is alert.     Lab Results Lab Results  Component Value Date   WBC 7.2 06/29/2020   HGB 14.2 07/20/2020   HCT 40.9 07/20/2020   MCV 96.2 06/29/2020   PLT 177 06/29/2020    Lab Results  Component  Value Date   CREATININE 1.82 (H) 03/01/2021   BUN 16 03/01/2021   NA 137 03/01/2021   K 4.2 03/01/2021   CL 103 03/01/2021   CO2 26 03/01/2021    Lab Results  Component Value Date   ALT 8 (L) 03/01/2021   AST 16 03/01/2021   ALKPHOS 132 (H) 10/21/2018   BILITOT 0.3 03/01/2021    Lab Results  Component Value Date   CHOL 191 06/29/2020   HDL 39 (L) 06/29/2020   LDLCALC 137 (H) 06/29/2020   TRIG 61 06/29/2020   CHOLHDL 4.9 06/29/2020   HIV 1 RNA Quant (Copies/mL)  Date Value  03/01/2021 25 (H)  02/08/2021 30 (H)  09/20/2020 32 (H)   CD4 T Cell Abs (/uL)  Date Value  02/08/2021 322 (L)  09/20/2020 316 (L)  06/29/2020 297 (L)    Assessment & Plan:   Problem List Items Addressed This Visit      Unprioritized   Leg pain, anterior, right - Primary    New onset pain in anterior right lower leg wrapping around  calf behind knee up to posterior thigh without any swelling, warmth or restricted ROM. Suspect muscular pain. Does not seem to be radicular and no low back pain / injury.  Will treat acute pain with Celebrex BID and QHS zanaflex for a few days. I don't think this is a blood clot but we discussed what this may look like that would warrant an ultrasound.  I recommended he get new supportive shoes for work - he has been using the same ones for months with high volume of walking. Probably should replace every 3-4 months or so.  Will write a note to help him rest until return on Tuesday.       Relevant Medications   celecoxib (CELEBREX) 200 MG capsule   tiZANidine (ZANAFLEX) 4 MG tablet      Rexene Alberts, MSN, NP-C [facility]  [email or web address] Pager: [phone] Office: [phone] [facility] Main Line: [phone]

## 2021-03-30 NOTE — Assessment & Plan Note (Signed)
New onset pain in anterior right lower leg wrapping around calf behind knee up to posterior thigh without any swelling, warmth or restricted ROM. Suspect muscular pain. Does not seem to be radicular and no low back pain / injury.  Will treat acute pain with Celebrex BID and QHS zanaflex for a few days. I don't think this is a blood clot but we discussed what this may look like that would warrant an ultrasound.  I recommended he get new supportive shoes for work - he has been using the same ones for months with high volume of walking. Probably should replace every 3-4 months or so.  Will write a note to help him rest until return on Tuesday.

## 2021-03-30 NOTE — Patient Instructions (Signed)
Rest, Ice (15 min at a time 2-4 times a day, especially at night), elevation.   A calf compression sleeve may also provide you more comfort   If you get swelling, warmth or increased pain around the calf please let me know that is a sign you need an ultrasound to evaluate a blood clot (which is probably not going on)  Will give a small supply of Zanaflex (muscle relaxer) to take at night for 3 days or so.  Will treat the pain with a medication called Celebrex twice a day. Take consistently for 5-7 days then as needed if you have any left.

## 2021-05-03 ENCOUNTER — Other Ambulatory Visit: Payer: Self-pay | Admitting: Infectious Diseases

## 2021-05-03 DIAGNOSIS — B20 Human immunodeficiency virus [HIV] disease: Secondary | ICD-10-CM

## 2021-06-01 ENCOUNTER — Encounter (INDEPENDENT_AMBULATORY_CARE_PROVIDER_SITE_OTHER): Payer: Self-pay | Admitting: *Deleted

## 2021-06-01 ENCOUNTER — Other Ambulatory Visit: Payer: Self-pay

## 2021-06-01 ENCOUNTER — Ambulatory Visit: Payer: Medicaid Other

## 2021-06-01 VITALS — BP 138/80 | HR 69 | Temp 98.4°F | Ht 69.0 in | Wt 253.0 lb

## 2021-06-01 DIAGNOSIS — Z006 Encounter for examination for normal comparison and control in clinical research program: Secondary | ICD-10-CM

## 2021-06-01 NOTE — Research (Signed)
Alex Mitchell  came in today to rescreen for the A5391 study. He was previously screened in April but it was determined that he had not been on his Biktarvy long enough to qualify for the study. He was treated for muscle strain in May but has not needed to take anything for it over the past month and denies any other problems. He was reconsented with the latest version of the consent and was able to verbalize the purpose of the study and would like to participate. He takes ceterizine as needed for allergies, but other than that only takes his Biktarvy. We have entry planned for 06/22/21 as long as he is determined to be eligible.

## 2021-06-02 ENCOUNTER — Encounter: Payer: Self-pay | Admitting: Infectious Diseases

## 2021-06-05 LAB — PHOSPHORUS: Phosphorus: 3.5 mg/dL (ref 2.5–4.5)

## 2021-06-05 LAB — BILIRUBIN, DIRECT: Bilirubin, Direct: 0 mg/dL (ref 0.0–0.2)

## 2021-06-05 LAB — COMPREHENSIVE METABOLIC PANEL
AG Ratio: 1.7 (calc) (ref 1.0–2.5)
ALT: 12 U/L (ref 9–46)
AST: 16 U/L (ref 10–40)
Albumin: 4.5 g/dL (ref 3.6–5.1)
Alkaline phosphatase (APISO): 58 U/L (ref 36–130)
BUN/Creatinine Ratio: 10 (calc) (ref 6–22)
BUN: 18 mg/dL (ref 7–25)
CO2: 26 mmol/L (ref 20–32)
Calcium: 9.3 mg/dL (ref 8.6–10.3)
Chloride: 106 mmol/L (ref 98–110)
Creat: 1.76 mg/dL — ABNORMAL HIGH (ref 0.60–1.26)
Globulin: 2.6 g/dL (calc) (ref 1.9–3.7)
Glucose, Bld: 95 mg/dL (ref 65–99)
Potassium: 4.5 mmol/L (ref 3.5–5.3)
Sodium: 139 mmol/L (ref 135–146)
Total Bilirubin: 0.4 mg/dL (ref 0.2–1.2)
Total Protein: 7.1 g/dL (ref 6.1–8.1)

## 2021-06-05 LAB — HIV-1 RNA QUANT-NO REFLEX-BLD
HIV 1 RNA Quant: <20 Copies/mL — ABNORMAL HIGH
HIV-1 RNA Quant, Log: <1.3 Log cps/mL — ABNORMAL HIGH

## 2021-06-22 ENCOUNTER — Encounter (INDEPENDENT_AMBULATORY_CARE_PROVIDER_SITE_OTHER): Payer: Self-pay | Admitting: *Deleted

## 2021-06-22 ENCOUNTER — Other Ambulatory Visit: Payer: Self-pay

## 2021-06-22 VITALS — BP 129/77 | HR 68 | Temp 98.3°F | Wt 257.1 lb

## 2021-06-22 DIAGNOSIS — Z006 Encounter for examination for normal comparison and control in clinical research program: Secondary | ICD-10-CM

## 2021-06-22 MED ORDER — STUDY - DO-IT A5391 - DORAVIRINE (PIFELTRO) 100MG TABLET (PI-VAN DAM): 100.0000 mg | ORAL_TABLET | Freq: Every day | ORAL | Status: DC

## 2021-06-22 MED ORDER — EMTRICITABINE-TENOFOVIR AF 200-25 MG PO TABS: 1.0000 | ORAL_TABLET | Freq: Every day | ORAL | 5 refills | Status: DC

## 2021-06-22 NOTE — Research (Signed)
Alex Mitchell was here today to enroll in the Do-It study. After eligibility was confirmed, he was randomized to receive doravirine 100mg  and descovy daily. He will stop his Biktarvy which he took yesterday. We gave him the doravirine and he will pick up the descovy from the pharmacy. He denied any current problems or new medications and was instructed on dosing. He will go over and get his DEXA today at [facility] and return in 4 weeks.

## 2021-06-23 LAB — COMPREHENSIVE METABOLIC PANEL
AG Ratio: 1.7 (calc) (ref 1.0–2.5)
ALT: 11 U/L (ref 9–46)
AST: 17 U/L (ref 10–40)
Albumin: 4.6 g/dL (ref 3.6–5.1)
Alkaline phosphatase (APISO): 51 U/L (ref 36–130)
BUN/Creatinine Ratio: 9 (calc) (ref 6–22)
BUN: 16 mg/dL (ref 7–25)
CO2: 26 mmol/L (ref 20–32)
Calcium: 9.4 mg/dL (ref 8.6–10.3)
Chloride: 105 mmol/L (ref 98–110)
Creat: 1.8 mg/dL — ABNORMAL HIGH (ref 0.60–1.26)
Globulin: 2.7 g/dL (calc) (ref 1.9–3.7)
Glucose, Bld: 91 mg/dL (ref 65–99)
Potassium: 4.4 mmol/L (ref 3.5–5.3)
Sodium: 138 mmol/L (ref 135–146)
Total Bilirubin: 0.4 mg/dL (ref 0.2–1.2)
Total Protein: 7.3 g/dL (ref 6.1–8.1)

## 2021-06-23 LAB — LIPID PANEL
Cholesterol: 213 mg/dL — ABNORMAL HIGH (ref <200)
HDL: 47 mg/dL (ref >=40)
LDL Cholesterol (Calc): 148 mg/dL (calc) — ABNORMAL HIGH
Non-HDL Cholesterol (Calc): 166 mg/dL (calc) — ABNORMAL HIGH (ref <130)
Total CHOL/HDL Ratio: 4.5 (calc) (ref <5.0)
Triglycerides: 76 mg/dL (ref <150)

## 2021-06-23 LAB — URINALYSIS, ROUTINE W REFLEX MICROSCOPIC
Bacteria, UA: NONE SEEN /HPF
Bilirubin Urine: NEGATIVE
Glucose, UA: NEGATIVE
Hgb urine dipstick: NEGATIVE
Hyaline Cast: NONE SEEN /LPF
Ketones, ur: NEGATIVE
Nitrite: NEGATIVE
Protein, ur: NEGATIVE
RBC / HPF: NONE SEEN /HPF (ref 0–2)
Specific Gravity, Urine: 1.015 (ref 1.001–1.035)
Squamous Epithelial / HPF: NONE SEEN /HPF (ref <=5)
WBC, UA: NONE SEEN /HPF (ref 0–5)
pH: 6.5 (ref 5.0–8.0)

## 2021-06-23 LAB — BILIRUBIN, DIRECT: Bilirubin, Direct: 0.1 mg/dL (ref 0.0–0.2)

## 2021-06-23 LAB — PHOSPHORUS: Phosphorus: 2.5 mg/dL (ref 2.5–4.5)

## 2021-07-10 LAB — HIV-1 RNA QUANT-NO REFLEX-BLD: HIV 1 RNA Quant: <40

## 2021-07-12 ENCOUNTER — Encounter: Payer: Self-pay | Admitting: *Deleted

## 2021-07-12 LAB — CD4/CD8 (T-HELPER/T-SUPPRESSOR CELL)
CD4 % Helper T Cell: 14.2
CD4 Count: 383
CD8 % Suppressor T Cell: 57
CD8 T Cell Abs: 1539

## 2021-07-20 ENCOUNTER — Encounter: Payer: Self-pay | Admitting: Infectious Disease

## 2021-07-20 ENCOUNTER — Encounter (INDEPENDENT_AMBULATORY_CARE_PROVIDER_SITE_OTHER): Payer: Self-pay | Admitting: *Deleted

## 2021-07-20 ENCOUNTER — Other Ambulatory Visit: Payer: Self-pay

## 2021-07-20 VITALS — BP 131/75 | HR 63 | Temp 98.2°F | Wt 258.5 lb

## 2021-07-20 DIAGNOSIS — Z006 Encounter for examination for normal comparison and control in clinical research program: Secondary | ICD-10-CM

## 2021-07-20 NOTE — Research (Signed)
Alex Mitchell here for his week 4 visit for A5391, the Do-It study. Excellent adherence with his study medications. No new complaints. States that he continues to have discomfort to bilat feet and lower legs, especially first thing in the morning. We discussed getting good supporting shoes for work, gentle stretching exercises and OTC tylenol or ibuprofen if needed. He is scheduled to return in October for his next study visit and to see Ivin Poot, NP for his 6 month follow-up.

## 2021-07-21 LAB — COMPREHENSIVE METABOLIC PANEL
AG Ratio: 1.8 (calc) (ref 1.0–2.5)
ALT: 12 U/L (ref 9–46)
AST: 19 U/L (ref 10–40)
Albumin: 4.5 g/dL (ref 3.6–5.1)
Alkaline phosphatase (APISO): 49 U/L (ref 36–130)
BUN/Creatinine Ratio: 11 (calc) (ref 6–22)
BUN: 17 mg/dL (ref 7–25)
CO2: 26 mmol/L (ref 20–32)
Calcium: 9.3 mg/dL (ref 8.6–10.3)
Chloride: 105 mmol/L (ref 98–110)
Creat: 1.56 mg/dL — ABNORMAL HIGH (ref 0.60–1.26)
Globulin: 2.5 g/dL (calc) (ref 1.9–3.7)
Glucose, Bld: 90 mg/dL (ref 65–99)
Potassium: 4.5 mmol/L (ref 3.5–5.3)
Sodium: 139 mmol/L (ref 135–146)
Total Bilirubin: 0.4 mg/dL (ref 0.2–1.2)
Total Protein: 7 g/dL (ref 6.1–8.1)

## 2021-07-21 LAB — BILIRUBIN, DIRECT: Bilirubin, Direct: 0.1 mg/dL (ref 0.0–0.2)

## 2021-07-21 LAB — PHOSPHORUS: Phosphorus: 2.8 mg/dL (ref 2.5–4.5)

## 2021-07-26 ENCOUNTER — Encounter: Payer: Self-pay | Admitting: Nurse Practitioner

## 2021-07-26 ENCOUNTER — Ambulatory Visit (INDEPENDENT_AMBULATORY_CARE_PROVIDER_SITE_OTHER): Payer: Self-pay | Admitting: Nurse Practitioner

## 2021-07-26 ENCOUNTER — Other Ambulatory Visit: Payer: Self-pay

## 2021-07-26 ENCOUNTER — Encounter: Payer: Medicaid Other | Admitting: Family Medicine

## 2021-07-26 VITALS — BP 136/76 | HR 63 | Temp 99.1°F | Ht 69.0 in | Wt 256.6 lb

## 2021-07-26 DIAGNOSIS — Z0000 Encounter for general adult medical examination without abnormal findings: Secondary | ICD-10-CM

## 2021-07-26 DIAGNOSIS — D508 Other iron deficiency anemias: Secondary | ICD-10-CM

## 2021-07-26 DIAGNOSIS — R7989 Other specified abnormal findings of blood chemistry: Secondary | ICD-10-CM

## 2021-07-26 LAB — POCT URINALYSIS DIP (CLINITEK)
Bilirubin, UA: NEGATIVE
Blood, UA: NEGATIVE
Glucose, UA: NEGATIVE mg/dL
Ketones, POC UA: NEGATIVE mg/dL
Leukocytes, UA: NEGATIVE
Nitrite, UA: NEGATIVE
POC PROTEIN,UA: NEGATIVE
Spec Grav, UA: 1.025 (ref 1.010–1.025)
Urobilinogen, UA: 0.2 E.U./dL
pH, UA: 6.5 (ref 5.0–8.0)

## 2021-07-26 NOTE — Patient Instructions (Signed)
Health Maintenance, Male Adopting a healthy lifestyle and getting preventive care are important in promoting health and wellness. Ask your health care provider about: The right schedule for you to have regular tests and exams. Things you can do on your own to prevent diseases and keep yourself healthy. What should I know about diet, weight, and exercise? Eat a healthy diet  Eat a diet that includes plenty of vegetables, fruits, low-fat dairy products, and lean protein. Do not eat a lot of foods that are high in solid fats, added sugars, or sodium. Maintain a healthy weight Body mass index (BMI) is a measurement that can be used to identify possible weight problems. It estimates body fat based on height and weight. Your health care provider can help determine your BMI and help you achieve or maintain a healthy weight. Get regular exercise Get regular exercise. This is one of the most important things you can do for your health. Most adults should: Exercise for at least 150 minutes each week. The exercise should increase your heart rate and make you sweat (moderate-intensity exercise). Do strengthening exercises at least twice a week. This is in addition to the moderate-intensity exercise. Spend less time sitting. Even light physical activity can be beneficial. Watch cholesterol and blood lipids Have your blood tested for lipids and cholesterol at 40 years of age, then have this test every 5 years. You may need to have your cholesterol levels checked more often if: Your lipid or cholesterol levels are high. You are older than 40 years of age. You are at high risk for heart disease. What should I know about cancer screening? Many types of cancers can be detected early and may often be prevented. Depending on your health history and family history, you may need to have cancer screening at various ages. This may include screening for: Colorectal cancer. Prostate cancer. Skin cancer. Lung  cancer. What should I know about heart disease, diabetes, and high blood pressure? Blood pressure and heart disease High blood pressure causes heart disease and increases the risk of stroke. This is more likely to develop in people who have high blood pressure readings, are of African descent, or are overweight. Talk with your health care provider about your target blood pressure readings. Have your blood pressure checked: Every 3-5 years if you are 18-39 years of age. Every year if you are 40 years old or older. If you are between the ages of 65 and 75 and are a current or former smoker, ask your health care provider if you should have a one-time screening for abdominal aortic aneurysm (AAA). Diabetes Have regular diabetes screenings. This checks your fasting blood sugar level. Have the screening done: Once every three years after age 45 if you are at a normal weight and have a low risk for diabetes. More often and at a younger age if you are overweight or have a high risk for diabetes. What should I know about preventing infection? Hepatitis B If you have a higher risk for hepatitis B, you should be screened for this virus. Talk with your health care provider to find out if you are at risk for hepatitis B infection. Hepatitis C Blood testing is recommended for: Everyone born from 1945 through 1965. Anyone with known risk factors for hepatitis C. Sexually transmitted infections (STIs) You should be screened each year for STIs, including gonorrhea and chlamydia, if: You are sexually active and are younger than 40 years of age. You are older than 40 years   of age and your health care provider tells you that you are at risk for this type of infection. Your sexual activity has changed since you were last screened, and you are at increased risk for chlamydia or gonorrhea. Ask your health care provider if you are at risk. Ask your health care provider about whether you are at high risk for HIV.  Your health care provider may recommend a prescription medicine to help prevent HIV infection. If you choose to take medicine to prevent HIV, you should first get tested for HIV. You should then be tested every 3 months for as long as you are taking the medicine. Follow these instructions at home: Lifestyle Do not use any products that contain nicotine or tobacco, such as cigarettes, e-cigarettes, and chewing tobacco. If you need help quitting, ask your health care provider. Do not use street drugs. Do not share needles. Ask your health care provider for help if you need support or information about quitting drugs. Alcohol use Do not drink alcohol if your health care provider tells you not to drink. If you drink alcohol: Limit how much you have to 0-2 drinks a day. Be aware of how much alcohol is in your drink. In the [place] one drink equals one 12 oz bottle of beer (355 mL), one 5 oz glass of wine (148 mL), or one 1 oz glass of hard liquor (44 mL). General instructions Schedule regular health, dental, and eye exams. Stay current with your vaccines. Tell your health care provider if: You often feel depressed. You have ever been abused or do not feel safe at home. Summary Adopting a healthy lifestyle and getting preventive care are important in promoting health and wellness. Follow your health care provider's instructions about healthy diet, exercising, and getting tested or screened for diseases. Follow your health care provider's instructions on monitoring your cholesterol and blood pressure. This information is not intended to replace advice given to you by your health care provider. Make sure you discuss any questions you have with your health care provider. Document Revised: 01/13/2021 Document Reviewed: 10/29/2018 Elsevier Patient Education  2022 Elsevier Inc.  

## 2021-07-26 NOTE — Progress Notes (Signed)
a  [facility] [place], [place]  [address] Phone:  [phone]   Fax:  [phone]   Established Patient Office Visit  Subjective:  Patient ID: Alex Mitchell, male    DOB: August 10, 1981  Age: 40 y.o. MRN: [id number]  CC:  Chief Complaint  Patient presents with   Annual Exam    Pt is here today for his annual physical and labs. Pt has no concerns or issues to discuss today.    HPI Alex Mitchell presents for annual exam. He  has a past medical history of AIDS (acquired immune deficiency syndrome) ([place]) (10/31/2018), [place], and Mouth sores.   He has a history of iron deficiency anemia.  He is currently on ferrous sulfate 325 mg daily.  He denies any problems with hemorrhoids or chronic blood loss.  He reports never being really told he was iron deficient. He does follow-up with infectious disease and is currently on a study medication for about 4 weeks.  He reports that the study is related to evaluating possible causes of weight gain and his current treatment; Descovy. He has a history of elevated serum creatinine which seems to have improved over the last month.  His BUN is still within normal range.  Estimated GFR not calculated. He works full-time at [employer].  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 He has had recent labs due to the new study.  Past Medical History:  Diagnosis Date   AIDS (acquired immune deficiency syndrome) ([place]) 10/31/2018   [place]    Mouth sores     History reviewed. No pertinent surgical history.  Family History  Problem Relation Age of Onset   Hypertension Other    Hypertension Father    Diabetes Neg Hx     Social History   Socioeconomic History   Marital status: Single    Spouse name: Not on file   Number of children: Not on file   Years of education: Not on file   Highest education level: Not on file  Occupational History   Not on file   Tobacco Use   Smoking status: Never   Smokeless tobacco: Never  Vaping Use   Vaping Use: Never used  Substance and Sexual Activity   Alcohol use: Not Currently   Drug use: No   Sexual activity: Not Currently  Other Topics Concern   Not on file  Social History Narrative   Not on file   Social Determinants of Health   Financial Resource Strain: Not on file  Food Insecurity: Not on file  Transportation Needs: Not on file  Physical Activity: Not on file  Stress: Not on file  Social Connections: Not on file  Intimate Partner Violence: Not on file    Outpatient Medications Prior to Visit  Medication Sig Dispense Refill   cetirizine (ZYRTEC) 10 MG tablet Take 1 tablet (10 mg total) by mouth daily. 30 tablet 11   emtricitabine-tenofovir AF (DESCOVY) 200-25 MG tablet Take 1 tablet by mouth daily. 30 tablet 5   ferrous sulfate 325 (65 FE) MG tablet Take 325 mg by mouth daily with breakfast.     Study - DO-IT A5391 - doravirine (PIFELTRO) 100 mg tablet ([place]) Take 1 tablet (100 mg total) by mouth daily.     Cranberry 250 MG CHEW Chew by mouth. (Patient not taking: Reported on 07/26/2021)     tiZANidine (ZANAFLEX) 4 MG  tablet Take 1 tablet (4 mg total) by mouth every 6 (six) hours as needed for muscle spasms. (Patient not taking: Reported on 07/26/2021) 5 tablet 0   No facility-administered medications prior to visit.    No Known Allergies  ROS Review of Systems    Objective:    Physical Exam Constitutional:      General: He is not in acute distress.    Appearance: He is obese. He is not ill-appearing, toxic-appearing or diaphoretic.  HENT:     Head: Normocephalic and atraumatic.     Nose: Nose normal.     Mouth/Throat:     Mouth: Mucous membranes are moist.  Cardiovascular:     Rate and Rhythm: Normal rate and regular rhythm.     Pulses: Normal pulses.     Heart sounds: Normal heart sounds.  Pulmonary:     Effort: Pulmonary effort is normal.     Breath sounds:  Normal breath sounds.  Abdominal:     Palpations: Abdomen is soft.     Comments: hypoactive  Musculoskeletal:        General: Normal range of motion.     Cervical back: Normal range of motion and neck supple.  Skin:    General: Skin is warm and dry.     Capillary Refill: Capillary refill takes less than 2 seconds.  Neurological:     General: No focal deficit present.     Mental Status: He is alert and oriented to person, place, and time.  Psychiatric:        Mood and Affect: Mood normal.        Behavior: Behavior normal.        Thought Content: Thought content normal.        Judgment: Judgment normal.    BP 136/76   Pulse 63   Temp 99.1 F (37.3 C)   Ht _0  (1.753 m)   Wt 256 lb 9.6 oz (116.4 kg)   SpO2 100%   BMI 37.89 kg/m  Wt Readings from Last 3 Encounters:  07/26/21 256 lb 9.6 oz (116.4 kg)  07/20/21 258 lb 8 oz (117.3 kg)  06/22/21 257 lb 2 oz (116.6 kg)     Health Maintenance Due  Topic Date Due   COVID-19 Vaccine (4 - Booster for Pfizer series) 02/21/2021   INFLUENZA VACCINE  06/19/2021    There are no preventive care reminders to display for this patient.  Lab Results  Component Value Date   TSH 1.030 10/21/2018   Lab Results  Component Value Date   WBC 7.2 06/29/2020   HGB 14.2 07/20/2020   HCT 40.9 07/20/2020   MCV 96.2 06/29/2020   PLT 177 06/29/2020   Lab Results  Component Value Date   NA 139 07/20/2021   K 4.5 07/20/2021   CO2 26 07/20/2021   GLUCOSE 90 07/20/2021   BUN 17 07/20/2021   CREATININE 1.56 (H) 07/20/2021   BILITOT 0.4 07/20/2021   ALKPHOS 132 (H) 10/21/2018   AST 19 07/20/2021   ALT 12 07/20/2021   PROT 7.0 07/20/2021   ALBUMIN 2.6 (L) 10/21/2018   CALCIUM 9.3 07/20/2021   ANIONGAP 11 10/29/2018   Lab Results  Component Value Date   CHOL 213 (H) 06/22/2021   Lab Results  Component Value Date   HDL 47 06/22/2021   Lab Results  Component Value Date   LDLCALC 148 (H) 06/22/2021   Lab Results  Component  Value Date   TRIG 76 06/22/2021  Lab Results  Component Value Date   CHOLHDL 4.5 06/22/2021   Lab Results  Component Value Date   HGBA1C 5.2 12/30/2019      Assessment & Plan:   Problem List Items Addressed This Visit       Other   Iron deficiency anemia Continue with current regimen.  No changes warranted. Good patient compliance.    Other Visit Diagnoses     Annual physical exam    -  Primary Discussed male health maintenance; self exams of breast and testicles  and annual exams. Discussed prostate age related concerns Discussed general safety in vehicle and COVID Discussed regular hydration with water Discussed healthy diet and exercise and weight management Discussed sexual health  Discussed mental health Encouraged to call our office for an appointment with in ongoing concerns for questions.      Relevant Orders   POCT URINALYSIS DIP (CLINITEK) (Completed)   CBC with Differential/Platelet   Iron, TIBC and Ferritin Panel   Elevated serum creatinine Encourage Mr Grobe to make sure that the new treatment medication is not excreted renally due to his current level. eGFR is needed. From previous CMP Jan 2021 eGFR was low at 55. This will be reevaluated.    No orders of the defined types were placed in this encounter.   Follow-up: Return in about 6 months (around 01/23/2022).    Vevelyn Francois, NP

## 2021-07-27 ENCOUNTER — Other Ambulatory Visit: Payer: Self-pay | Admitting: Nurse Practitioner

## 2021-07-27 DIAGNOSIS — R7989 Other specified abnormal findings of blood chemistry: Secondary | ICD-10-CM

## 2021-07-27 LAB — CBC WITH DIFFERENTIAL/PLATELET
Basophils Absolute: 0 10*3/uL (ref 0.0–0.2)
Basos: 1 %
EOS (ABSOLUTE): 0.4 10*3/uL (ref 0.0–0.4)
Eos: 7 %
Hematocrit: 36.8 % — ABNORMAL LOW (ref 37.5–51.0)
Hemoglobin: 12.2 g/dL — ABNORMAL LOW (ref 13.0–17.7)
Immature Grans (Abs): 0 10*3/uL (ref 0.0–0.1)
Immature Granulocytes: 0 %
Lymphocytes Absolute: 2.5 10*3/uL (ref 0.7–3.1)
Lymphs: 43 %
MCH: 31 pg (ref 26.6–33.0)
MCHC: 33.2 g/dL (ref 31.5–35.7)
MCV: 94 fL (ref 79–97)
Monocytes Absolute: 0.6 10*3/uL (ref 0.1–0.9)
Monocytes: 10 %
Neutrophils Absolute: 2.2 10*3/uL (ref 1.4–7.0)
Neutrophils: 39 %
Platelets: 192 10*3/uL (ref 150–450)
RBC: 3.93 x10E6/uL — ABNORMAL LOW (ref 4.14–5.80)
RDW: 13.6 % (ref 11.6–15.4)
WBC: 5.7 10*3/uL (ref 3.4–10.8)

## 2021-07-27 LAB — IRON,TIBC AND FERRITIN PANEL
Ferritin: 264 ng/mL (ref 30–400)
Iron Saturation: 19 % (ref 15–55)
Iron: 61 ug/dL (ref 38–169)
Total Iron Binding Capacity: 325 ug/dL (ref 250–450)
UIBC: 264 ug/dL (ref 111–343)

## 2021-07-27 NOTE — Progress Notes (Signed)
   [facility] [address] [place],   [address] Phone:  [phone]   Fax:  [phone] 

## 2021-08-04 ENCOUNTER — Encounter: Payer: Self-pay | Admitting: *Deleted

## 2021-08-04 LAB — HIV-1 RNA QUANT-NO REFLEX-BLD: HIV 1 RNA Quant: <40

## 2021-08-23 ENCOUNTER — Encounter: Payer: Self-pay | Admitting: Nurse Practitioner

## 2021-08-23 ENCOUNTER — Ambulatory Visit (INDEPENDENT_AMBULATORY_CARE_PROVIDER_SITE_OTHER): Payer: Self-pay | Admitting: Nurse Practitioner

## 2021-08-23 ENCOUNTER — Other Ambulatory Visit: Payer: Self-pay

## 2021-08-23 VITALS — BP 134/76 | HR 64 | Temp 97.4°F | Ht 69.0 in | Wt 251.0 lb

## 2021-08-23 DIAGNOSIS — T148XXA Other injury of unspecified body region, initial encounter: Secondary | ICD-10-CM

## 2021-08-23 DIAGNOSIS — S76912A Strain of unspecified muscles, fascia and tendons at thigh level, left thigh, initial encounter: Secondary | ICD-10-CM

## 2021-08-23 DIAGNOSIS — S76911A Strain of unspecified muscles, fascia and tendons at thigh level, right thigh, initial encounter: Secondary | ICD-10-CM

## 2021-08-23 DIAGNOSIS — M62838 Other muscle spasm: Secondary | ICD-10-CM

## 2021-08-23 MED ORDER — NAPROXEN 500 MG PO TABS: 500.0000 mg | ORAL_TABLET | Freq: Two times a day (BID) | ORAL | 0 refills | Status: DC | PRN

## 2021-08-23 MED ORDER — METHOCARBAMOL 500 MG PO TABS: 500.0000 mg | ORAL_TABLET | Freq: Four times a day (QID) | ORAL | 0 refills | Status: DC | PRN

## 2021-08-23 MED ORDER — METHYLPREDNISOLONE SODIUM SUCC 40 MG IJ SOLR
40.0000 mg | Freq: Once | INTRAMUSCULAR | Status: AC
Start: 2021-08-23 — End: 2021-08-23
  Administered 2021-08-23: 40 mg via INTRAMUSCULAR

## 2021-08-23 NOTE — Patient Instructions (Signed)
You were seen today in the [facility] for bilateral leg pain . Labs were collected, results will be available via MyChart or, if abnormal, you will be contacted by clinic staff. You were prescribed medications, please take as directed. Please follow up in 1-2 wks as needed for reevaluation.

## 2021-08-23 NOTE — Progress Notes (Signed)
[facility] Patient [facility] [address] [place], [place]  [address] Phone:  [phone]   Fax:  [phone] Subjective:   Patient ID: Alex Mitchell, male    DOB: 05/09/1981, 40 y.o.   MRN: [id number]  Chief Complaint  Patient presents with   Leg Pain    Bilateral, works for [employer] Has gotten worse this week, does not take anything for pain.   Leg Pain   Arlin Lorenda Hatchet 40 y.o. male with history of AIDS and chancre to the [facility] for bilateral leg pain. Patient states that he has had intermittent bilateral leg pain for several months that returned this week. Pain radiates from bilateral thighs to beneath feet. Described as pressure and aching. Currently rates 10/10. Worsens with lifting, walking and certain movements. Denies any pain at rest. Was evaluated and treated a few months ago with only mild improvement in symptoms. Denies any back pain. 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 Denies any other complaints today.  Denies any fatigue, chest pain, shortness of breath, HA or dizziness. Denies any blurred vision, numbness or tingling.   Past Medical History:  Diagnosis Date   AIDS (acquired immune deficiency syndrome) (HCC) 10/31/2018   Chancre    Mouth sores     No past surgical history on file.  Family History  Problem Relation Age of Onset   Hypertension Other    Hypertension Father    Diabetes Neg Hx     Social History   Socioeconomic History   Marital status: Single    Spouse name: Not on file   Number of children: Not on file   Years of education: Not on file   Highest education level: Not on file  Occupational History   Not on file  Tobacco Use   Smoking status: Never   Smokeless tobacco: Never  Vaping Use   Vaping Use: Never used  Substance and Sexual Activity   Alcohol use: Not Currently   Drug use: No   Sexual activity: Not Currently  Other Topics Concern   Not on file  Social History Narrative   Not on file   Social  Determinants of Health   Financial Resource Strain: Not on file  Food Insecurity: Not on file  Transportation Needs: Not on file  Physical Activity: Not on file  Stress: Not on file  Social Connections: Not on file  Intimate Partner Violence: Not on file    Outpatient Medications Prior to Visit  Medication Sig Dispense Refill   cetirizine (ZYRTEC) 10 MG tablet Take 1 tablet (10 mg total) by mouth daily. 30 tablet 11   Cranberry 250 MG CHEW Chew by mouth.     emtricitabine-tenofovir AF (DESCOVY) 200-25 MG tablet Take 1 tablet by mouth daily. 30 tablet 5   ferrous sulfate 325 (65 FE) MG tablet Take 325 mg by mouth daily with breakfast.     Study - DO-IT A5391 - doravirine (PIFELTRO) 100 mg tablet (PI-[place]) Take 1 tablet (100 mg total) by mouth daily.     No facility-administered medications prior to visit.    No Known Allergies  Review of Systems  Constitutional:  Negative for chills, fever and malaise/fatigue.  Respiratory:  Negative for cough and shortness of breath.   Cardiovascular:  Negative for chest pain, palpitations and leg swelling.  Gastrointestinal:  Negative for abdominal pain, blood in stool, constipation, diarrhea, nausea and vomiting.  Musculoskeletal:  Positive for myalgias.  Skin: Negative.   Neurological: Negative.  Psychiatric/Behavioral:  Negative for depression. The patient is not nervous/anxious.   All other systems reviewed and are negative.     Objective:    Physical Exam Constitutional:      General: He is not in acute distress.    Appearance: Normal appearance. He is normal weight.  HENT:     Head: Normocephalic.     Right Ear: Tympanic membrane, ear canal and external ear normal.     Left Ear: Tympanic membrane, ear canal and external ear normal.     Nose: Nose normal.     Mouth/Throat:     Mouth: Mucous membranes are moist.  Eyes:     Extraocular Movements: Extraocular movements intact.     Conjunctiva/sclera: Conjunctivae normal.      Pupils: Pupils are equal, round, and reactive to light.  Cardiovascular:     Rate and Rhythm: Normal rate and regular rhythm.     Pulses: Normal pulses.     Heart sounds: Normal heart sounds.     Comments: No obvious peripheral edema Pulmonary:     Effort: Pulmonary effort is normal.     Breath sounds: Normal breath sounds.  Musculoskeletal:        General: Tenderness present. No swelling, deformity or signs of injury. Normal range of motion.     Cervical back: Normal range of motion and neck supple.     Right lower leg: No edema.     Left lower leg: No edema.     Comments: Spinal column and diffuse lower back non tender to palpation. Diffuse bilateral thighs tender to palpation.  Skin:    General: Skin is warm and dry.     Capillary Refill: Capillary refill takes less than 2 seconds.  Neurological:     General: No focal deficit present.     Mental Status: He is alert and oriented to person, place, and time. Mental status is at baseline.  Psychiatric:        Mood and Affect: Mood normal.        Behavior: Behavior normal.        Thought Content: Thought content normal.        Judgment: Judgment normal.    BP 134/76 (BP Location: Right Arm, Patient Position: Sitting)   Pulse 64   Temp (!) 97.4 F (36.3 C)   Ht 5\' 9"  (1.753 m)   Wt 251 lb 0.2 oz (113.9 kg)   SpO2 100%   BMI 37.07 kg/m  Wt Readings from Last 3 Encounters:  08/23/21 251 lb 0.2 oz (113.9 kg)  07/26/21 256 lb 9.6 oz (116.4 kg)  07/20/21 258 lb 8 oz (117.3 kg)    Immunization History  Administered Date(s) Administered   Influenza,inj,Quad PF,6+ Mos 11/24/2018, 08/13/2019, 10/11/2020   Meningococcal Mcv4o 02/22/2021   PFIZER(Purple Top)SARS-COV-2 Vaccination 02/11/2020, 03/07/2020, 11/23/2020   Pneumococcal Conjugate-13 08/13/2019, 10/13/2019   Pneumococcal Polysaccharide-23 10/11/2020   Tdap 01/01/2015    Diabetic Foot Exam - Simple   No data filed     Lab Results  Component Value Date   TSH 1.030  10/21/2018   Lab Results  Component Value Date   WBC 5.7 07/26/2021   HGB 12.2 (L) 07/26/2021   HCT 36.8 (L) 07/26/2021   MCV 94 07/26/2021   PLT 192 07/26/2021   Lab Results  Component Value Date   NA 139 07/20/2021   K 4.5 07/20/2021   CO2 26 07/20/2021   GLUCOSE 90 07/20/2021   BUN 17 07/20/2021  CREATININE 1.56 (H) 07/20/2021   BILITOT 0.4 07/20/2021   ALKPHOS 132 (H) 10/21/2018   AST 19 07/20/2021   ALT 12 07/20/2021   PROT 7.0 07/20/2021   ALBUMIN 2.6 (L) 10/21/2018   CALCIUM 9.3 07/20/2021   ANIONGAP 11 10/29/2018   Lab Results  Component Value Date   CHOL 213 (H) 06/22/2021   CHOL 191 06/29/2020   Lab Results  Component Value Date   HDL 47 06/22/2021   HDL 39 (L) 06/29/2020   Lab Results  Component Value Date   LDLCALC 148 (H) 06/22/2021   LDLCALC 137 (H) 06/29/2020   Lab Results  Component Value Date   TRIG 76 06/22/2021   TRIG 61 06/29/2020   Lab Results  Component Value Date   CHOLHDL 4.5 06/22/2021   CHOLHDL 4.9 06/29/2020   Lab Results  Component Value Date   HGBA1C 5.2 12/30/2019   HGBA1C 5.4 11/05/2018       Assessment & Plan:  Likely diagnosis musculoskeletal strain due to overuse, but maintain concern for possible electrolyte abnormality and/ other underlying musculoskeletal etiology/ disease process given the chronicity of pain and patient medical history, will refer to orthopedics and order magnesium level.   Problem List Items Addressed This Visit   None Visit Diagnoses     Muscle strain    -  Primary   Relevant Medications   methylPREDNISolone sodium succinate (SOLU-MEDROL) 40 mg/mL injection 40 mg   methocarbamol (ROBAXIN) 500 MG tablet   naproxen (NAPROSYN) 500 MG tablet   Other Relevant Orders   Ambulatory referral to Orthopedic Surgery   Muscle spasms of both lower extremities       Relevant Medications   methylPREDNISolone sodium succinate (SOLU-MEDROL) 40 mg/mL injection 40 mg   methocarbamol (ROBAXIN) 500 MG  tablet   naproxen (NAPROSYN) 500 MG tablet   Other Relevant Orders   Magnesium   Ambulatory referral to Orthopedic Surgery   Maintain previously established follow up, sooner as needed    I am having Archie Patten start on methocarbamol and naproxen. I am also having him maintain his cetirizine, ferrous sulfate, Cranberry, emtricitabine-tenofovir AF, and Do-IT A5391 doravirine. We will continue to administer methylPREDNISolone sodium succinate.  Meds ordered this encounter  Medications   methylPREDNISolone sodium succinate (SOLU-MEDROL) 40 mg/mL injection 40 mg   methocarbamol (ROBAXIN) 500 MG tablet    Sig: Take 1 tablet (500 mg total) by mouth every 6 (six) hours as needed for muscle spasms.    Dispense:  30 tablet    Refill:  0   naproxen (NAPROSYN) 500 MG tablet    Sig: Take 1 tablet (500 mg total) by mouth 2 (two) times daily as needed for mild pain or moderate pain.    Dispense:  20 tablet    Refill:  0     Kathrynn Speed, NP

## 2021-08-24 ENCOUNTER — Other Ambulatory Visit: Payer: Self-pay

## 2021-08-24 DIAGNOSIS — R7989 Other specified abnormal findings of blood chemistry: Secondary | ICD-10-CM

## 2021-08-24 DIAGNOSIS — Z113 Encounter for screening for infections with a predominantly sexual mode of transmission: Secondary | ICD-10-CM

## 2021-08-24 DIAGNOSIS — B20 Human immunodeficiency virus [HIV] disease: Secondary | ICD-10-CM

## 2021-08-24 LAB — MAGNESIUM: Magnesium: 1.9 mg/dL (ref 1.6–2.3)

## 2021-08-25 LAB — T-HELPER CELL (CD4) - (RCID CLINIC ONLY)
CD4 % Helper T Cell: 12 % — ABNORMAL LOW (ref 33–65)
CD4 T Cell Abs: 442 /uL (ref 400–1790)

## 2021-08-27 LAB — COMPLETE METABOLIC PANEL WITH GFR
AG Ratio: 1.8 (calc) (ref 1.0–2.5)
ALT: 13 U/L (ref 9–46)
AST: 21 U/L (ref 10–40)
Albumin: 4.8 g/dL (ref 3.6–5.1)
Alkaline phosphatase (APISO): 57 U/L (ref 36–130)
BUN/Creatinine Ratio: 13 (calc) (ref 6–22)
BUN: 24 mg/dL (ref 7–25)
CO2: 24 mmol/L (ref 20–32)
Calcium: 9.9 mg/dL (ref 8.6–10.3)
Chloride: 103 mmol/L (ref 98–110)
Creat: 1.82 mg/dL — ABNORMAL HIGH (ref 0.60–1.26)
Globulin: 2.6 g/dL (calc) (ref 1.9–3.7)
Glucose, Bld: 103 mg/dL — ABNORMAL HIGH (ref 65–99)
Potassium: 4.3 mmol/L (ref 3.5–5.3)
Sodium: 138 mmol/L (ref 135–146)
Total Bilirubin: 0.4 mg/dL (ref 0.2–1.2)
Total Protein: 7.4 g/dL (ref 6.1–8.1)
eGFR: 48 mL/min/{1.73_m2} — ABNORMAL LOW (ref >=60)

## 2021-08-27 LAB — CBC WITH DIFFERENTIAL/PLATELET
Absolute Monocytes: 1053 cells/uL — ABNORMAL HIGH (ref 200–950)
Basophils Absolute: 27 cells/uL (ref 0–200)
Basophils Relative: 0.3 %
Eosinophils Absolute: 180 cells/uL (ref 15–500)
Eosinophils Relative: 2 %
HCT: 38.3 % — ABNORMAL LOW (ref 38.5–50.0)
Hemoglobin: 12.8 g/dL — ABNORMAL LOW (ref 13.2–17.1)
Lymphs Abs: 3663 cells/uL (ref 850–3900)
MCH: 31.8 pg (ref 27.0–33.0)
MCHC: 33.4 g/dL (ref 32.0–36.0)
MCV: 95 fL (ref 80.0–100.0)
MPV: 11.8 fL (ref 7.5–12.5)
Monocytes Relative: 11.7 %
Neutro Abs: 4077 cells/uL (ref 1500–7800)
Neutrophils Relative %: 45.3 %
Platelets: 210 10*3/uL (ref 140–400)
RBC: 4.03 10*6/uL — ABNORMAL LOW (ref 4.20–5.80)
RDW: 13.1 % (ref 11.0–15.0)
Total Lymphocyte: 40.7 %
WBC: 9 10*3/uL (ref 3.8–10.8)

## 2021-08-27 LAB — HIV-1 RNA QUANT-NO REFLEX-BLD
HIV 1 RNA Quant: NOT DETECTED Copies/mL
HIV-1 RNA Quant, Log: NOT DETECTED Log cps/mL

## 2021-08-27 LAB — RPR: RPR Ser Ql: NONREACTIVE

## 2021-09-07 ENCOUNTER — Other Ambulatory Visit: Payer: Self-pay

## 2021-09-07 ENCOUNTER — Encounter (INDEPENDENT_AMBULATORY_CARE_PROVIDER_SITE_OTHER): Payer: Self-pay | Admitting: *Deleted

## 2021-09-07 ENCOUNTER — Ambulatory Visit (INDEPENDENT_AMBULATORY_CARE_PROVIDER_SITE_OTHER): Payer: Self-pay | Admitting: Infectious Diseases

## 2021-09-07 VITALS — BP 125/69 | HR 64 | Temp 98.3°F | Wt 254.5 lb

## 2021-09-07 VITALS — BP 125/69 | HR 64 | Temp 98.3°F | Resp 16 | Ht 69.0 in | Wt 254.8 lb

## 2021-09-07 DIAGNOSIS — B20 Human immunodeficiency virus [HIV] disease: Secondary | ICD-10-CM

## 2021-09-07 DIAGNOSIS — Z6837 Body mass index (BMI) 37.0-37.9, adult: Secondary | ICD-10-CM

## 2021-09-07 DIAGNOSIS — R7989 Other specified abnormal findings of blood chemistry: Secondary | ICD-10-CM

## 2021-09-07 DIAGNOSIS — Z006 Encounter for examination for normal comparison and control in clinical research program: Secondary | ICD-10-CM

## 2021-09-07 DIAGNOSIS — Z23 Encounter for immunization: Secondary | ICD-10-CM

## 2021-09-07 NOTE — Assessment & Plan Note (Addendum)
Doing well on Biktarvy once daily. Viral load is undetectable and his immune system has recovered nicely now CD4 > 400. Flu shot given today for health maintenance. He is otherwise up to date for recommended vaccines. Would be eligible for bivalent COVID booster as well.   HIV 1 RNA Quant  Date Value  08/24/2021 Not Detected Copies/mL  07/20/2021 <40  06/22/2021 <40   CD4 T Cell Abs (/uL)  Date Value  08/24/2021 442  02/08/2021 322 (L)  09/20/2020 316 (L)   We tried switching off TAF in the past which had no effect on creatinine levels, and he preferred to go back to [employer] due to this. Will see about helping Jill get PCAP to help pay forward insurance premiums to allow access to specialty care (nephrology, orthopedics).   RTC in 34m

## 2021-09-07 NOTE — Progress Notes (Signed)
Name: Alex Mitchell  DOB: May 16, 1981 MRN: [id number] PCP: Barbette Merino, NP    Brief Narrative:  Alex Mitchell is a 40 y.o. 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  History of OIs: PJP pna, esophageal candidiasis   Previous Regimens: Biktarvy 09-2018    Genotypes: Not performed in hospital    Subjective:   Chief Complaint  Patient presents with   Follow-up      HPI:  Alex Mitchell is here for 6 month follow up for routine HIV care. Working with research team for ACTG study re: weight loss study and currently taking Pifeltro and Descovy once daily. Prefers single tablet regimen but the Descovy is fortunately very small and makes 2-pill regimen tolerable. Met with Alex Mitchell today. Would like to get flu and bivalent COVID today.   Still with ongoing muscle spasm/musculoskeletal pain. Referred to Orthopedics was placed. He had a good response after getting steroid injection recently and rest at home from work. He does take NSAIDS and BC powders periodically for tooth pain and headaches; was more frequently lately due to acute tooth pain - he has a dental visit coming up.   Wt Readings from Last 3 Encounters:  09/07/21 254 lb 12.8 oz (115.6 kg)  09/07/21 254 lb 8 oz (115.4 kg)  08/23/21 251 lb 0.2 oz (113.9 kg)   Review of Systems  Constitutional: negative for fever, chills, activity change or weight change. Cardiovascular: Negative for leg swelling.  Gastrointestinal: Negative for abdominal pain, distension, diarrhea Musculoskeletal: Negative for myalgias, arthralgias, back pain   Neurological: Negative for weakness and numbness.  GU: negative for urinary symptoms Hematological: Negative for adenopathy.     Outpatient Medications Prior to Visit  Medication Sig Dispense Refill   cetirizine (ZYRTEC) 10 MG tablet Take 1 tablet (10 mg total) by mouth daily. 30 tablet 11   Cranberry 250 MG CHEW Chew by mouth.      emtricitabine-tenofovir AF (DESCOVY) 200-25 MG tablet Take 1 tablet by mouth daily. 30 tablet 5   ferrous sulfate 325 (65 FE) MG tablet Take 325 mg by mouth daily with breakfast.     methocarbamol (ROBAXIN) 500 MG tablet Take 1 tablet (500 mg total) by mouth every 6 (six) hours as needed for muscle spasms. 30 tablet 0   naproxen (NAPROSYN) 500 MG tablet Take 1 tablet (500 mg total) by mouth 2 (two) times daily as needed for mild pain or moderate pain. 20 tablet 0   Study - DO-IT A5391 - doravirine (PIFELTRO) 100 mg tablet (PI-[place]) Take 1 tablet (100 mg total) by mouth daily.     No facility-administered medications prior to visit.     No Known Allergies  Social History   Tobacco Use   Smoking status: Never   Smokeless tobacco: Never  Vaping Use   Vaping Use: Never used  Substance Use Topics   Alcohol use: Not Currently   Drug use: No    Social History   Substance and Sexual Activity  Sexual Activity Not Currently     Objective:   Vitals:   09/07/21 0855  BP: 125/69  Pulse: 64  Resp: 16  Temp: 98.3 F (36.8 C)  TempSrc: Oral  Weight: 254 lb 12.8 oz (115.6 kg)  Height: 5\' 9"  (1.753 m)    Body mass index is 37.63 kg/m.   Physical Exam:  Vitals reviewed  Consitutional: well appearing  Cardiovascular: regular rate and rhythm Pulmonary:  normal effort, no coughing or wheezing Musculoskeletal: normal gait Skin: warm, dry, well perfused, no rashes Neurologic: alert and oriented. Pleasant.     Lab Results Lab Results  Component Value Date   WBC 9.0 08/24/2021   HGB 12.8 (L) 08/24/2021   HCT 38.3 (L) 08/24/2021   MCV 95.0 08/24/2021   PLT 210 08/24/2021    Lab Results  Component Value Date   CREATININE 1.82 (H) 08/24/2021   BUN 24 08/24/2021   NA 138 08/24/2021   K 4.3 08/24/2021   CL 103 08/24/2021   CO2 24 08/24/2021    Lab Results  Component Value Date   ALT 13 08/24/2021   AST 21 08/24/2021   ALKPHOS 132 (H) 10/21/2018   BILITOT 0.4  08/24/2021    Lab Results  Component Value Date   CHOL 213 (H) 06/22/2021   HDL 47 06/22/2021   LDLCALC 148 (H) 06/22/2021   TRIG 76 06/22/2021   CHOLHDL 4.5 06/22/2021   HIV 1 RNA Quant  Date Value  08/24/2021 Not Detected Copies/mL  07/20/2021 <40  06/22/2021 <40   CD4 T Cell Abs (/uL)  Date Value  08/24/2021 442  02/08/2021 322 (L)  09/20/2020 316 (L)    Assessment & Plan:   Problem List Items Addressed This Visit       Unprioritized   HIV disease ([place]) (Chronic)    Doing well on Biktarvy once daily. Viral load is undetectable and his immune system has recovered nicely now CD4 > 400. Flu shot given today for health maintenance. He is otherwise up to date for recommended vaccines. Would be eligible for bivalent COVID booster as well.   HIV 1 RNA Quant  Date Value  08/24/2021 Not Detected Copies/mL  07/20/2021 <40  06/22/2021 <40   CD4 T Cell Abs (/uL)  Date Value  08/24/2021 442  02/08/2021 322 (L)  09/20/2020 316 (L)   We tried switching off TAF in the past which had no effect on creatinine levels, and he preferred to go back to [place] due to this. Will see about helping Hagen get PCAP to help pay forward insurance premiums to allow access to specialty care (nephrology, orthopedics).   RTC in 62m      Relevant Orders   HIV-1 RNA quant-no reflex-bld   T-helper cell (CD4)- ([facility] clinic only)   Elevated serum creatinine    Lab Results  Component Value Date   CREATININE 1.82 (H) 08/24/2021   CREATININE 1.56 (H) 07/20/2021   CREATININE 1.80 (H) 06/22/2021  Fluctuating between 1.5-1.8 creatinine. He was on TAF sparing regimen for 3 months (Dovato) and there was no change in creatinine; he elected to go back to [place] at that time. I would like get nephrology's opinion on other contributors. BP looks great. Counseled to avoid NSAIDS and prefer tylenol as 1st line OTC analgesic. Referral to nephrology once he has insurance access.  Check urinalysis today.  Renal function panel Q104m.        Relevant Orders   Urinalysis   Renal Function Panel   BMI 37.0-37.9, adult    Has maintained weight around 250 after losing weight. BMI 37.5 today.  Continues on integrase sparing ARV regimen for this reason.        Janene Madeira, MSN, NP-C [facility] [email or web address] Pager: [phone] Office: [phone] [place]: [phone]

## 2021-09-07 NOTE — Assessment & Plan Note (Addendum)
Lab Results  Component Value Date   CREATININE 1.82 (H) 08/24/2021   CREATININE 1.56 (H) 07/20/2021   CREATININE 1.80 (H) 06/22/2021   Fluctuating between 1.5-1.8 creatinine. He was on TAF sparing regimen for 3 months (Dovato) and there was no change in creatinine; he elected to go back to [place] at that time. I would like get nephrology's opinion on other contributors. BP looks great. Counseled to avoid NSAIDS and prefer tylenol as 1st line OTC analgesic. Referral to nephrology once he has insurance access.  Check urinalysis today. Renal function panel Q66m.

## 2021-09-07 NOTE — Assessment & Plan Note (Signed)
Has maintained weight around 250 after losing weight. BMI 37.5 today.  Continues on integrase sparing ARV regimen for this reason.

## 2021-09-07 NOTE — Patient Instructions (Addendum)
Please continue your current medications - no changes from me.   For pain medications - please try to stick to tylenol only. Can do 2 extra strength 3 times a day. This is safer for your kidneys. It's probably OK to use a BC powder every once in a while but try the tylenol first to see if that helps.   Flu and COVID booster given today.   Please schedule 2 appointments when you leave:  Financial team before mid-November so we can help get you information about a program that helps you with health insurance to see other specialists.  Appt with Judeth Cornfield in 6 months - can do lab visit 2 weeks before if you prefer to have them ahead of time. Just schedule accordingly.   Don't work to hard!

## 2021-09-07 NOTE — Research (Signed)
Alex Mitchell here today for his week 12 visit for A5391, the Do-It study. Excellent adherence with his medications. Verbalized occasional loose stools since starting his new study regimen otherwise no problems with his medications. He will return in January for his next study visit.

## 2021-09-08 LAB — COMPREHENSIVE METABOLIC PANEL
AG Ratio: 1.7 (calc) (ref 1.0–2.5)
ALT: 11 U/L (ref 9–46)
AST: 17 U/L (ref 10–40)
Albumin: 4.5 g/dL (ref 3.6–5.1)
Alkaline phosphatase (APISO): 53 U/L (ref 36–130)
BUN/Creatinine Ratio: 10 (calc) (ref 6–22)
BUN: 18 mg/dL (ref 7–25)
CO2: 25 mmol/L (ref 20–32)
Calcium: 9.3 mg/dL (ref 8.6–10.3)
Chloride: 106 mmol/L (ref 98–110)
Creat: 1.77 mg/dL — ABNORMAL HIGH (ref 0.60–1.26)
Globulin: 2.6 g/dL (calc) (ref 1.9–3.7)
Glucose, Bld: 99 mg/dL (ref 65–99)
Potassium: 4.3 mmol/L (ref 3.5–5.3)
Sodium: 140 mmol/L (ref 135–146)
Total Bilirubin: 0.3 mg/dL (ref 0.2–1.2)
Total Protein: 7.1 g/dL (ref 6.1–8.1)

## 2021-09-08 LAB — URINALYSIS
Bilirubin Urine: NEGATIVE
Glucose, UA: NEGATIVE
Hgb urine dipstick: NEGATIVE
Ketones, ur: NEGATIVE
Leukocytes,Ua: NEGATIVE
Nitrite: NEGATIVE
Protein, ur: NEGATIVE
Specific Gravity, Urine: 1.019 (ref 1.001–1.035)
pH: 6.5 (ref 5.0–8.0)

## 2021-09-08 LAB — PHOSPHORUS: Phosphorus: 3.4 mg/dL (ref 2.5–4.5)

## 2021-09-08 LAB — BILIRUBIN, DIRECT: Bilirubin, Direct: 0.1 mg/dL (ref 0.0–0.2)

## 2021-09-26 ENCOUNTER — Telehealth: Payer: Self-pay

## 2021-09-26 NOTE — Telephone Encounter (Signed)
Attempted to schedule patient for 2nd MPX vaccine this week as [facility] has limited supply. Patient confirmed that he recently had COVID booster which he would have to wait 4 week until getting his send MPX vaccine. Patient will send [facility] a MyChart message with updated COVID card information to confirm date of vaccination as this information is not showing up in [facility]. Valarie Cones

## 2021-09-27 LAB — HIV-1 RNA QUANT-NO REFLEX-BLD: HIV 1 RNA Quant: <40

## 2021-10-05 ENCOUNTER — Encounter: Payer: Self-pay | Admitting: *Deleted

## 2021-12-06 ENCOUNTER — Other Ambulatory Visit: Payer: Self-pay

## 2021-12-06 ENCOUNTER — Encounter (INDEPENDENT_AMBULATORY_CARE_PROVIDER_SITE_OTHER): Payer: Self-pay | Admitting: *Deleted

## 2021-12-06 VITALS — BP 121/71 | HR 69 | Temp 98.2°F | Wt 256.6 lb

## 2021-12-06 DIAGNOSIS — Z006 Encounter for examination for normal comparison and control in clinical research program: Secondary | ICD-10-CM

## 2021-12-06 LAB — URINALYSIS, COMPLETE
Bacteria, UA: NONE SEEN /HPF
Bilirubin Urine: NEGATIVE
Glucose, UA: NEGATIVE
Hgb urine dipstick: NEGATIVE
Hyaline Cast: NONE SEEN /LPF
Ketones, ur: NEGATIVE
Leukocytes,Ua: NEGATIVE
Nitrite: NEGATIVE
Protein, ur: NEGATIVE
RBC / HPF: NONE SEEN /HPF (ref 0–2)
Specific Gravity, Urine: 1.008 (ref 1.001–1.035)
Squamous Epithelial / HPF: NONE SEEN /HPF (ref <=5)
WBC, UA: NONE SEEN /HPF (ref 0–5)
pH: 6 (ref 5.0–8.0)

## 2021-12-06 LAB — PHOSPHORUS: Phosphorus: 3.3 mg/dL (ref 2.5–4.5)

## 2021-12-06 LAB — LIPID PANEL
Cholesterol: 240 mg/dL — ABNORMAL HIGH (ref <200)
HDL: 47 mg/dL (ref >=40)
LDL Cholesterol (Calc): 176 mg/dL — ABNORMAL HIGH
Non-HDL Cholesterol (Calc): 193 mg/dL — ABNORMAL HIGH (ref <130)
Total CHOL/HDL Ratio: 5.1 (calc) — ABNORMAL HIGH (ref <5.0)
Triglycerides: 69 mg/dL (ref <150)

## 2021-12-06 LAB — BILIRUBIN, DIRECT: Bilirubin, Direct: 0.1 mg/dL (ref 0.0–0.2)

## 2021-12-06 NOTE — Research (Signed)
Blease seen today for his week 24 visit for A5391, the Do-It study. No new complaints. Verbalized excellent adherence with his study medications. He will return in April for his next study visit and is scheduled to see Janene Madeira, NP in May.

## 2021-12-07 LAB — COMPREHENSIVE METABOLIC PANEL
AG Ratio: 2 (calc) (ref 1.0–2.5)
ALT: 11 U/L (ref 9–46)
AST: 18 U/L (ref 10–40)
Albumin: 4.5 g/dL (ref 3.6–5.1)
Alkaline phosphatase (APISO): 54 U/L (ref 36–130)
BUN/Creatinine Ratio: 10 (calc) (ref 6–22)
BUN: 15 mg/dL (ref 7–25)
CO2: 26 mmol/L (ref 20–32)
Calcium: 9.7 mg/dL (ref 8.6–10.3)
Chloride: 104 mmol/L (ref 98–110)
Creat: 1.54 mg/dL — ABNORMAL HIGH (ref 0.60–1.29)
Globulin: 2.3 g/dL (calc) (ref 1.9–3.7)
Glucose, Bld: 99 mg/dL (ref 65–99)
Potassium: 4.4 mmol/L (ref 3.5–5.3)
Sodium: 137 mmol/L (ref 135–146)
Total Bilirubin: 0.5 mg/dL (ref 0.2–1.2)
Total Protein: 6.8 g/dL (ref 6.1–8.1)

## 2021-12-12 ENCOUNTER — Encounter: Payer: Self-pay | Admitting: Infectious Disease

## 2021-12-12 LAB — CD4/CD8 (T-HELPER/T-SUPPRESSOR CELL)
CD4 Count: 324
CD4%: 16.2
CD8 % Suppressor T Cell: 55.8
CD8: 1116

## 2021-12-13 ENCOUNTER — Encounter: Payer: Self-pay | Admitting: Infectious Diseases

## 2021-12-15 ENCOUNTER — Other Ambulatory Visit: Payer: Self-pay

## 2021-12-18 MED ORDER — EMTRICITABINE-TENOFOVIR AF 200-25 MG PO TABS: 1.0000 | ORAL_TABLET | Freq: Every day | ORAL | 5 refills | Status: DC

## 2021-12-23 ENCOUNTER — Emergency Department (HOSPITAL_COMMUNITY)
Admission: EM | Admit: 2021-12-23 | Discharge: 2021-12-23 | Disposition: A | Discharge status: Home / Self Care | Payer: Medicaid Other | Attending: Emergency Medicine | Admitting: Emergency Medicine

## 2021-12-23 ENCOUNTER — Other Ambulatory Visit: Payer: Self-pay

## 2021-12-23 ENCOUNTER — Encounter (HOSPITAL_COMMUNITY): Payer: Self-pay

## 2021-12-23 DIAGNOSIS — Y990 Civilian activity done for income or pay: Secondary | ICD-10-CM | POA: Insufficient documentation

## 2021-12-23 DIAGNOSIS — W540XXA Bitten by dog, initial encounter: Secondary | ICD-10-CM | POA: Insufficient documentation

## 2021-12-23 DIAGNOSIS — W540XXD Bitten by dog, subsequent encounter: Secondary | ICD-10-CM

## 2021-12-23 DIAGNOSIS — S81859A Open bite, unspecified lower leg, initial encounter: Secondary | ICD-10-CM | POA: Insufficient documentation

## 2021-12-23 NOTE — Discharge Instructions (Signed)
Since you have confirmed with animal control that the dog that bit you is up-to-date on vaccinations, the rabies vaccination is considered optional.  It is reasonable for you not to take it today if you prefer to avoid the vaccination.  Please keep the wound very clean and dry.  Change the bandage daily and start your antibiotic from urgent care tonight.  When you are at rest, elevate the leg to alleviate swelling.  You can use ice and heat to help with pain and swelling as well.  Alternate Tylenol and Motrin for pain.  If the wound shows signs of infection including becoming very warm, draining what appears to be pus or worsening pain, please return to the emergency department for further treatment.

## 2021-12-23 NOTE — ED Triage Notes (Signed)
Pt reports he was bitten by a dog today and went to an UC. Pt received tetanus shot at UC, but was unable to get rabies shot.

## 2021-12-23 NOTE — ED Provider Notes (Signed)
[facility]-EMERGENCY DEPT Provider Note   CSN: [id number] Arrival date & time: 12/23/21  1813     History  Chief Complaint  Patient presents with   Animal Bite    Alex Mitchell is a 41 y.o. male who presents to the ED from urgent care for evaluation of a dog bite he sustained while at work today.  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  He was seen in urgent care where the wound was cleaned and wrapped.  He was given Tdap but not rabies.  He was sent here to get his rabies shot.  Since then, animal control has located the animal and has verified vaccination records.  Patient no longer wishes to obtain rabies vaccine.  He was given a course of antibiotics from urgent care.  He has no other complaints   Animal Bite Associated symptoms: no fever and no rash       Home Medications Prior to Admission medications   Medication Sig Start Date End Date Taking? Authorizing Provider  cetirizine (ZYRTEC) 10 MG tablet Take 1 tablet (10 mg total) by mouth daily. 09/30/19   Kallie Locks, FNP  Cranberry 250 MG CHEW Chew by mouth.    [provider]  emtricitabine-tenofovir AF (DESCOVY) 200-25 MG tablet Take 1 tablet by mouth daily. 12/18/21   Horton Finer, PA-C  ferrous sulfate 325 (65 FE) MG tablet Take 325 mg by mouth daily with breakfast.    [provider]  methocarbamol (ROBAXIN) 500 MG tablet Take 1 tablet (500 mg total) by mouth every 6 (six) hours as needed for muscle spasms. 08/23/21   Passmore, Enid Derry I, NP  naproxen (NAPROSYN) 500 MG tablet Take 1 tablet (500 mg total) by mouth 2 (two) times daily as needed for mild pain or moderate pain. 08/23/21   Orion Crook I, NP  Study - DO-IT [phone] - doravirine (PIFELTRO) 100 mg tablet (PI-[place]) Take 1 tablet (100 mg total) by mouth daily. 06/22/21   Randall Hiss, MD      Allergies    Patient has no known allergies.    Review of Systems   Review of  Systems  Constitutional:  Negative for fever.  HENT: Negative.    Eyes: Negative.   Respiratory:  Negative for shortness of breath.   Cardiovascular: Negative.   Gastrointestinal:  Negative for abdominal pain and vomiting.  Endocrine: Negative.   Genitourinary: Negative.   Musculoskeletal: Negative.   Skin:  Negative for rash.  Neurological:  Negative for headaches.  All other systems reviewed and are negative.  Physical Exam Updated Vital Signs BP (!) 151/94 (BP Location: Right Arm)    Pulse 71    Temp 97.8 F (36.6 C) (Oral)    Resp 18    SpO2 100%  Physical Exam Vitals and nursing note reviewed.  Constitutional:      General: He is not in acute distress.    Appearance: He is not ill-appearing.  HENT:     Head: Atraumatic.  Eyes:     Conjunctiva/sclera: Conjunctivae normal.  Cardiovascular:     Rate and Rhythm: Normal rate and regular rhythm.     Pulses: Normal pulses.     Heart sounds: No murmur heard. Pulmonary:     Effort: Pulmonary effort is normal. No respiratory distress.     Breath sounds: Normal breath sounds.  Abdominal:     General: Abdomen is flat. There is  no distension.     Palpations: Abdomen is soft.     Tenderness: There is no abdominal tenderness.  Musculoskeletal:        General: Normal range of motion.     Cervical back: Normal range of motion.  Skin:    General: Skin is warm and dry.     Capillary Refill: Capillary refill takes less than 2 seconds.  Neurological:     General: No focal deficit present.     Mental Status: He is alert.  Psychiatric:        Mood and Affect: Mood normal.    ED Results / Procedures / Treatments   Labs (all labs ordered are listed, but only abnormal results are displayed) Labs Reviewed - No data to display  EKG None  Radiology No results found.  Procedures Procedures    Medications Ordered in ED Medications - No data to display  ED Course/ Medical Decision Making/ A&P                            Medical Decision Making  History:   Disposition:  After consideration of the physical exam, history I feel that the patent would benefit from discharge with strict return precautions.   Dog bite subsequent encounter: Patient was initially sent here for rabies shot, however vaccine records were confirmed on the offending animal.  That dog is now in quarantine.  Patient does not have insurance and wishes to not proceed with rabies vaccination dog's vaccination status is known.  This is reasonable option.  Return precautions were discussed.  Discharged home in good condition    Final Clinical Impression(s) / ED Diagnoses Final diagnoses:  Dog bite, subsequent encounter    Rx / DC Orders ED Discharge Orders     None         Delight Ovens 12/23/21 2202    Lorre Nick, MD 12/24/21 1413

## 2021-12-25 ENCOUNTER — Encounter: Payer: Self-pay | Admitting: Infectious Disease

## 2021-12-25 LAB — HIV RNA, QUANTITATIVE, PCR: HIV 1 RNA Quant: <40

## 2022-01-05 ENCOUNTER — Other Ambulatory Visit: Payer: Self-pay | Admitting: Physician Assistant

## 2022-01-05 DIAGNOSIS — B20 Human immunodeficiency virus [HIV] disease: Secondary | ICD-10-CM

## 2022-01-05 MED ORDER — BIKTARVY 50-200-25 MG PO TABS
1.0000 | ORAL_TABLET | Freq: Every day | ORAL | 5 refills | Status: DC
Start: 2022-01-05 — End: 2022-03-22

## 2022-01-10 ENCOUNTER — Other Ambulatory Visit: Payer: Self-pay

## 2022-01-10 ENCOUNTER — Encounter (INDEPENDENT_AMBULATORY_CARE_PROVIDER_SITE_OTHER): Payer: Self-pay | Admitting: *Deleted

## 2022-01-10 VITALS — BP 120/75 | HR 54 | Temp 98.0°F | Wt 261.6 lb

## 2022-01-10 DIAGNOSIS — Z006 Encounter for examination for normal comparison and control in clinical research program: Secondary | ICD-10-CM

## 2022-01-10 LAB — URINALYSIS, COMPLETE
Bacteria, UA: NONE SEEN /HPF
Bilirubin Urine: NEGATIVE
Glucose, UA: NEGATIVE
Hgb urine dipstick: NEGATIVE
Hyaline Cast: NONE SEEN /LPF
Ketones, ur: NEGATIVE
Leukocytes,Ua: NEGATIVE
Nitrite: NEGATIVE
Protein, ur: NEGATIVE
RBC / HPF: NONE SEEN /HPF (ref 0–2)
Specific Gravity, Urine: 1.018 (ref 1.001–1.035)
Squamous Epithelial / HPF: NONE SEEN /HPF (ref <=5)
WBC, UA: NONE SEEN /HPF (ref 0–5)
pH: 7 (ref 5.0–8.0)

## 2022-01-10 NOTE — Research (Signed)
Alex Mitchell seen today for his premature discontinuation visit for [phone]. We discussed the reason for this discontinuation. He verbalized understanding and questions were answered. He took his last dose of study medication this morning and plans to restart [employer]. New prescription sent to pharmacy. He is scheduled to see his PCP in March and has a visit with Rexene Alberts, NP at [facility] in May. States that he is interested in participating in other studies in the future.  He was seen at the urgent care earlier this month for a dog bite to his (R) calf. States that the dog was quarantined for 10 days and found to be up-to-date on all its shots. Area healed. Denied any pain or discomfort to area.

## 2022-01-11 LAB — COMPREHENSIVE METABOLIC PANEL
AG Ratio: 1.8 (calc) (ref 1.0–2.5)
ALT: 13 U/L (ref 9–46)
AST: 16 U/L (ref 10–40)
Albumin: 4.6 g/dL (ref 3.6–5.1)
Alkaline phosphatase (APISO): 53 U/L (ref 36–130)
BUN/Creatinine Ratio: 11 (calc) (ref 6–22)
BUN: 19 mg/dL (ref 7–25)
CO2: 26 mmol/L (ref 20–32)
Calcium: 9.4 mg/dL (ref 8.6–10.3)
Chloride: 104 mmol/L (ref 98–110)
Creat: 1.74 mg/dL — ABNORMAL HIGH (ref 0.60–1.29)
Globulin: 2.5 g/dL (calc) (ref 1.9–3.7)
Glucose, Bld: 101 mg/dL — ABNORMAL HIGH (ref 65–99)
Potassium: 4.4 mmol/L (ref 3.5–5.3)
Sodium: 135 mmol/L (ref 135–146)
Total Bilirubin: 0.5 mg/dL (ref 0.2–1.2)
Total Protein: 7.1 g/dL (ref 6.1–8.1)

## 2022-01-11 LAB — LIPID PANEL
Cholesterol: 231 mg/dL — ABNORMAL HIGH (ref <200)
HDL: 44 mg/dL (ref >=40)
LDL Cholesterol (Calc): 168 mg/dL (calc) — ABNORMAL HIGH
Non-HDL Cholesterol (Calc): 187 mg/dL (calc) — ABNORMAL HIGH (ref <130)
Total CHOL/HDL Ratio: 5.3 (calc) — ABNORMAL HIGH (ref <5.0)
Triglycerides: 83 mg/dL (ref <150)

## 2022-01-11 LAB — PHOSPHORUS: Phosphorus: 3.2 mg/dL (ref 2.5–4.5)

## 2022-01-11 LAB — BILIRUBIN, DIRECT: Bilirubin, Direct: 0.1 mg/dL (ref 0.0–0.2)

## 2022-01-12 ENCOUNTER — Ambulatory Visit (HOSPITAL_COMMUNITY)
Admission: EM | Admit: 2022-01-12 | Discharge: 2022-01-12 | Disposition: A | Discharge status: Home / Self Care | Payer: Medicaid Other | Attending: Internal Medicine | Admitting: Internal Medicine

## 2022-01-12 ENCOUNTER — Other Ambulatory Visit: Payer: Self-pay

## 2022-01-12 ENCOUNTER — Encounter (HOSPITAL_COMMUNITY): Payer: Self-pay

## 2022-01-12 DIAGNOSIS — A084 Viral intestinal infection, unspecified: Secondary | ICD-10-CM | POA: Insufficient documentation

## 2022-01-12 DIAGNOSIS — R6889 Other general symptoms and signs: Secondary | ICD-10-CM | POA: Insufficient documentation

## 2022-01-12 DIAGNOSIS — R112 Nausea with vomiting, unspecified: Secondary | ICD-10-CM | POA: Insufficient documentation

## 2022-01-12 DIAGNOSIS — Z20822 Contact with and (suspected) exposure to covid-19: Secondary | ICD-10-CM | POA: Insufficient documentation

## 2022-01-12 LAB — POC INFLUENZA A AND B ANTIGEN (URGENT CARE ONLY)
INFLUENZA A ANTIGEN, POC: NEGATIVE
INFLUENZA B ANTIGEN, POC: NEGATIVE

## 2022-01-12 MED ORDER — ACETAMINOPHEN 325 MG PO TABS
650.0000 mg | ORAL_TABLET | Freq: Once | ORAL | Status: AC
  Administered 2022-01-12: 650 mg via ORAL

## 2022-01-12 MED ORDER — ACETAMINOPHEN 325 MG PO TABS
ORAL_TABLET | ORAL | Status: AC
  Filled 2022-01-12: qty 2

## 2022-01-12 MED ORDER — ONDANSETRON 4 MG PO TBDP: 4.0000 mg | ORAL_TABLET | Freq: Three times a day (TID) | ORAL | 0 refills | Status: DC | PRN

## 2022-01-12 NOTE — ED Provider Notes (Signed)
[facility]    CSN: [id number] Arrival date & time: 01/12/22  1920      History   Chief Complaint Chief Complaint  Patient presents with   Nausea   Emesis    HPI Alex Mitchell is a 41 y.o. male.   Patient reports 1 day history of fever, body aches, dizziness, headache, nausea, vomiting, nonbloody diarrhea, and decreased appetite.  He is also been more tired than normal.  He reports his symptoms started suddenly today while at work.  He denies any cough, shortness of breath, wheezing, chest tightness, congestion, sore throat, sinus pressure, tooth or ear pain, any new rash on his skin.  He has not taken anything for his symptoms.  He took an at home COVID test today that was negative.   Past Medical History:  Diagnosis Date   AIDS (acquired immune deficiency syndrome) ([place]) 10/31/2018   [place]    Mouth sores     Patient Active Problem List   Diagnosis Date Noted   Leg pain, anterior, right 03/30/2021   Elevated serum creatinine 03/29/2020   At risk for adverse drug interaction 03/29/2020   BMI 37.0-37.9, adult 09/30/2019   Seasonal allergies 09/30/2019   Iron deficiency anemia 05/13/2019   Healthcare maintenance 10/31/2018   HIV disease ([place]) 10/31/2018    History reviewed. No pertinent surgical history.     Home Medications    Prior to Admission medications   Medication Sig Start Date End Date Taking? Authorizing Provider  ondansetron (ZOFRAN-ODT) 4 MG disintegrating tablet Take 1 tablet (4 mg total) by mouth every 8 (eight) hours as needed for nausea or vomiting. 01/12/22  Yes Noemi Chapel A, NP  bictegravir-emtricitabine-tenofovir AF (BIKTARVY) 50-200-25 MG TABS tablet Take 1 tablet by mouth daily. 01/05/22   Robert Bellow, PA-C  cetirizine (ZYRTEC) 10 MG tablet Take 1 tablet (10 mg total) by mouth daily. 09/30/19   Azzie Glatter, FNP  Cranberry 250 MG CHEW Chew by mouth.    [provider]  ferrous sulfate 325 (65 FE) MG tablet  Take 325 mg by mouth daily with breakfast.    [provider]  methocarbamol (ROBAXIN) 500 MG tablet Take 1 tablet (500 mg total) by mouth every 6 (six) hours as needed for muscle spasms. 08/23/21   Bo Merino I, NP    Family History Family History  Problem Relation Age of Onset   Hypertension Other    Hypertension Father    Diabetes Neg Hx     Social History Social History   Tobacco Use   Smoking status: Never   Smokeless tobacco: Never  Vaping Use   Vaping Use: Never used  Substance Use Topics   Alcohol use: Not Currently   Drug use: No     Allergies   Patient has no known allergies.   Review of Systems Review of Systems Per HPI  Physical Exam Triage Vital Signs ED Triage Vitals  Enc Vitals Group     BP 01/12/22 1952 137/75     Pulse Rate 01/12/22 1952 (!) 104     Resp 01/12/22 1952 20     Temp 01/12/22 1952 (!) 100.6 F (38.1 C)     Temp Source 01/12/22 1952 Oral     SpO2 01/12/22 1952 100 %     Weight --      Height --      Head Circumference --      Peak Flow --      Pain Score 01/12/22  1951 4     Pain Loc --      Pain Edu? --      Excl. in [place]? --    No data found.  Updated Vital Signs BP 137/75 (BP Location: Left Arm)    Pulse (!) 104    Temp (!) 100.6 F (38.1 C) (Oral)    Resp 20    SpO2 100%   Visual Acuity Right Eye Distance:   Left Eye Distance:   Bilateral Distance:    Right Eye Near:   Left Eye Near:    Bilateral Near:     Physical Exam Vitals and nursing note reviewed.  Constitutional:      General: He is not in acute distress.    Appearance: Normal appearance. He is ill-appearing. He is not toxic-appearing.  HENT:     Head: Normocephalic and atraumatic.     Right Ear: Tympanic membrane, ear canal and external ear normal. There is no impacted cerumen.     Left Ear: Tympanic membrane, ear canal and external ear normal. There is no impacted cerumen.     Nose: Congestion present. No rhinorrhea.     Mouth/Throat:      Mouth: Mucous membranes are moist.     Pharynx: Oropharynx is clear. No oropharyngeal exudate or posterior oropharyngeal erythema.  Eyes:     General: No scleral icterus.    Extraocular Movements: Extraocular movements intact.  Cardiovascular:     Rate and Rhythm: Normal rate and regular rhythm.  Pulmonary:     Effort: Pulmonary effort is normal. No respiratory distress.     Breath sounds: Normal breath sounds. No wheezing, rhonchi or rales.  Abdominal:     General: Abdomen is flat. Bowel sounds are normal. There is no distension.     Palpations: Abdomen is soft. There is no mass.     Tenderness: There is abdominal tenderness (generalized). There is no right CVA tenderness, left CVA tenderness or guarding. Negative signs include Murphy's sign and McBurney's sign.  Musculoskeletal:     Cervical back: Normal range of motion.  Lymphadenopathy:     Cervical: No cervical adenopathy.  Skin:    General: Skin is warm and dry.     Capillary Refill: Capillary refill takes less than 2 seconds.     Coloration: Skin is not jaundiced or pale.     Findings: No erythema.  Neurological:     Mental Status: He is alert and oriented to person, place, and time.     Motor: No weakness.     Gait: Gait normal.  Psychiatric:        Mood and Affect: Mood normal.        Behavior: Behavior normal.        Thought Content: Thought content normal.        Judgment: Judgment normal.     UC Treatments / Results  Labs (all labs ordered are listed, but only abnormal results are displayed) Labs Reviewed  SARS CORONAVIRUS 2 (TAT 6-24 HRS)  POC INFLUENZA A AND B ANTIGEN (URGENT CARE ONLY)    EKG   Radiology No results found.  Procedures Procedures (including critical care time)  Medications Ordered in UC Medications  acetaminophen (TYLENOL) tablet 650 mg (650 mg Oral Given 01/12/22 2002)    Initial Impression / Assessment and Plan / UC Course  I have reviewed the triage vital signs and the  nursing notes.  Pertinent labs & imaging results that were available during my care of the  patient were reviewed by me and considered in my medical decision making (see chart for details).     Influenza test today is negative.  We have also checked for COVID-19.  Symptoms are consistent with viral gastroenteritis.  Discussed pushing fluids with sugar-free electrolyte solution or Pedialyte.  Encourage treating fever with Tylenol/ibuprofen.  Prescription sent in for antiemetic to help with nausea.  Follow-up with PCP if symptoms have not improved early next week.  If symptoms worsen, go to the emergency room. Final Clinical Impressions(s) / UC Diagnoses   Final diagnoses:  Flu-like symptoms  Nausea and vomiting, unspecified vomiting type  Viral gastroenteritis     Discharge Instructions      Your flu test today was negative.  If your COVID test comes back positive, we will send in antiviral medication for you.  Please try to push fluids (sugar free electrolyte solution or pedialyte).  You can use ondansetron for nausea or vomiting.  Please come back to see [place] or follow up with your PCP if your symptoms have not improved early next week.     ED Prescriptions     Medication Sig Dispense Auth. Provider   ondansetron (ZOFRAN-ODT) 4 MG disintegrating tablet Take 1 tablet (4 mg total) by mouth every 8 (eight) hours as needed for nausea or vomiting. 20 tablet Eulogio Bear, NP      PDMP not reviewed this encounter.   Eulogio Bear, NP 01/12/22 2028

## 2022-01-12 NOTE — Discharge Instructions (Addendum)
Your flu test today was negative.  If your COVID test comes back positive, we will send in antiviral medication for you.  Please try to push fluids (sugar free electrolyte solution or pedialyte).  You can use ondansetron for nausea or vomiting.  Please come back to see [place] or follow up with your PCP if your symptoms have not improved early next week.  If symptoms worsen and you are unable to keep any food or fluid down for more than 48 hours, please go to the emergency room.

## 2022-01-12 NOTE — ED Triage Notes (Signed)
Pt reports feeling, weak, nauseas, body aches, stomach soreness and chills X 1 day.   States he was vomiting and feeling dizzy.

## 2022-01-13 LAB — SARS CORONAVIRUS 2 (TAT 6-24 HRS): SARS Coronavirus 2: NEGATIVE

## 2022-01-15 ENCOUNTER — Encounter: Payer: Self-pay | Admitting: *Deleted

## 2022-01-15 ENCOUNTER — Other Ambulatory Visit (HOSPITAL_COMMUNITY): Payer: Self-pay

## 2022-01-15 LAB — CD4/CD8 (T-HELPER/T-SUPPRESSOR CELL)
CD4 Count: 380
CD4 Count: 380
CD4%: 16.5
CD4%: 16.5
CD8 % Suppressor T Cell: 54.7
CD8 % Suppressor T Cell: 54.7
CD8: 1258
CD8: 1258

## 2022-01-24 ENCOUNTER — Ambulatory Visit: Payer: Medicaid Other | Admitting: Nurse Practitioner

## 2022-01-29 ENCOUNTER — Encounter: Payer: Self-pay | Admitting: Infectious Disease

## 2022-01-29 LAB — HIV RNA, QUANTITATIVE, PCR: HIV 1 RNA Quant: <40

## 2022-03-07 ENCOUNTER — Other Ambulatory Visit: Payer: Self-pay

## 2022-03-07 DIAGNOSIS — R7989 Other specified abnormal findings of blood chemistry: Secondary | ICD-10-CM

## 2022-03-07 DIAGNOSIS — B20 Human immunodeficiency virus [HIV] disease: Secondary | ICD-10-CM

## 2022-03-08 ENCOUNTER — Other Ambulatory Visit: Payer: Self-pay

## 2022-03-10 LAB — RENAL FUNCTION PANEL
Albumin: 4.6 g/dL (ref 3.6–5.1)
BUN/Creatinine Ratio: 10 (calc) (ref 6–22)
BUN: 19 mg/dL (ref 7–25)
CO2: 25 mmol/L (ref 20–32)
Calcium: 9.6 mg/dL (ref 8.6–10.3)
Chloride: 105 mmol/L (ref 98–110)
Creat: 1.87 mg/dL — ABNORMAL HIGH (ref 0.60–1.29)
Glucose, Bld: 98 mg/dL (ref 65–99)
Phosphorus: 3.5 mg/dL (ref 2.5–4.5)
Potassium: 4.1 mmol/L (ref 3.5–5.3)
Sodium: 138 mmol/L (ref 135–146)

## 2022-03-10 LAB — HELPER T-LYMPH-CD4 (ARMC ONLY)
% CD 4 Pos. Lymph.: 13.8 % — ABNORMAL LOW (ref 30.8–58.5)
Absolute CD 4 Helper: 455 /uL (ref 359–1519)
Basophils Absolute: 0 10*3/uL (ref 0.0–0.2)
Basos: 0 %
EOS (ABSOLUTE): 0.4 10*3/uL (ref 0.0–0.4)
Eos: 5 %
Hematocrit: 38.8 % (ref 37.5–51.0)
Hemoglobin: 13 g/dL (ref 13.0–17.7)
Immature Grans (Abs): 0 10*3/uL (ref 0.0–0.1)
Immature Granulocytes: 0 %
Lymphocytes Absolute: 3.3 10*3/uL — ABNORMAL HIGH (ref 0.7–3.1)
Lymphs: 47 %
MCH: 31.9 pg (ref 26.6–33.0)
MCHC: 33.5 g/dL (ref 31.5–35.7)
MCV: 95 fL (ref 79–97)
Monocytes Absolute: 0.4 10*3/uL (ref 0.1–0.9)
Monocytes: 6 %
Neutrophils Absolute: 3 10*3/uL (ref 1.4–7.0)
Neutrophils: 42 %
Platelets: 214 10*3/uL (ref 150–450)
RBC: 4.08 x10E6/uL — ABNORMAL LOW (ref 4.14–5.80)
RDW: 12.8 % (ref 11.6–15.4)
WBC: 7.1 10*3/uL (ref 3.4–10.8)

## 2022-03-10 LAB — HIV-1 RNA QUANT-NO REFLEX-BLD
HIV 1 RNA Quant: NOT DETECTED copies/mL
HIV-1 RNA Quant, Log: NOT DETECTED Log copies/mL

## 2022-03-13 LAB — T-HELPER CELL (CD4) - (RCID CLINIC ONLY)

## 2022-03-22 ENCOUNTER — Other Ambulatory Visit: Payer: Self-pay

## 2022-03-22 ENCOUNTER — Ambulatory Visit (INDEPENDENT_AMBULATORY_CARE_PROVIDER_SITE_OTHER): Payer: Self-pay | Admitting: Infectious Diseases

## 2022-03-22 DIAGNOSIS — G629 Polyneuropathy, unspecified: Secondary | ICD-10-CM

## 2022-03-22 DIAGNOSIS — B20 Human immunodeficiency virus [HIV] disease: Secondary | ICD-10-CM

## 2022-03-22 DIAGNOSIS — R7989 Other specified abnormal findings of blood chemistry: Secondary | ICD-10-CM

## 2022-03-22 DIAGNOSIS — Z21 Asymptomatic human immunodeficiency virus [HIV] infection status: Secondary | ICD-10-CM

## 2022-03-22 DIAGNOSIS — N289 Disorder of kidney and ureter, unspecified: Secondary | ICD-10-CM

## 2022-03-22 MED ORDER — GABAPENTIN 300 MG PO CAPS: 300.0000 mg | ORAL_CAPSULE | Freq: Three times a day (TID) | ORAL | 0 refills | Status: DC

## 2022-03-22 MED ORDER — DOVATO 50-300 MG PO TABS: 1.0000 | ORAL_TABLET | Freq: Every day | ORAL | 11 refills | Status: DC

## 2022-03-22 NOTE — Assessment & Plan Note (Addendum)
Suspect he has neuropathy based on description of burning pains now. Will trial PM gabapentin 300 mg. Can increase up to 3 times a day if he tolerates without too many side effects. Work note provided for recovery during acute flare - suggested he look into getting FMLA set up for job protection.  ?

## 2022-03-22 NOTE — Progress Notes (Signed)
? ?Name: Alex Mitchell  ?DOB: May 01, 1981 ?MRN: [id number] ?PCP: Barbette Merino, NP  ? ?VIRTUAL CARE ENCOUNTER ? ?I connected with Alex Mitchell on 03/22/22 at  9:30 AM EDT by TELEPHONE and verified that I am speaking with the correct person using two identifiers. ?  ?I discussed the limitations, risks, security and privacy concerns of performing an evaluation and management service by telephone and the availability of in person appointments. I also discussed with the patient that there may be a patient responsible charge related to this service. The patient expressed understanding and agreed to proceed. ? ?Patient Location: [facility]  ? ?Other Participants:  ? ?Provider Location: [facility] Office  ? ?Brief Narrative:  ?Alex Mitchell is a 41 y.o. male with HIV infection (+) AIDS with CD4 6, VL 770,000 copies; dx during hospitalization 09-2018 ?HIV Risk: MSM.  ?History of OIs: PJP pna, esophageal candidiasis  ? ?Previous Regimens: ?Biktarvy 09-2018  ?Dovato 03/2022 ? ? ?Genotypes: ?Not performed in hospital  ? ? ?Subjective:  ? ?CC:  ?HIV follow up.  ?Burning feet pains; needs a letter to be out of work  ? ? ? ?HPI:  ?Alex Mitchell is calling from home today. He had to take the day off because of recurrent foot/leg sharp and burning pains that make it difficult to walk.  His feet/calves are still causing some significant problems with him. He is working so hard and so much. When he takes time off of work and rests the pain improves. He does not find anything in particular helps it.  ? ?Continues to take his Biktarvy everyday without missed doses or concern for side effects or access. No other new concerns.  ? ? ?Review of Systems  ?Review of Systems ? ? ?Outpatient Medications Prior to Visit  ?Medication Sig Dispense Refill  ? cetirizine (ZYRTEC) 10 MG tablet Take 1 tablet (10 mg total) by mouth daily. 30 tablet 11  ? Cranberry 250 MG CHEW Chew by mouth.    ? ferrous sulfate 325 (65 FE) MG tablet Take 325 mg by mouth daily with  breakfast.    ? methocarbamol (ROBAXIN) 500 MG tablet Take 1 tablet (500 mg total) by mouth every 6 (six) hours as needed for muscle spasms. 30 tablet 0  ? ondansetron (ZOFRAN-ODT) 4 MG disintegrating tablet Take 1 tablet (4 mg total) by mouth every 8 (eight) hours as needed for nausea or vomiting. 20 tablet 0  ? bictegravir-emtricitabine-tenofovir AF (BIKTARVY) 50-200-25 MG TABS tablet Take 1 tablet by mouth daily. 30 tablet 5  ? ?No facility-administered medications prior to visit.  ?  ? ?No Known Allergies ? ?Social History  ? ?Tobacco Use  ? Smoking status: Never  ? Smokeless tobacco: Never  ?Vaping Use  ? Vaping Use: Never used  ?Substance Use Topics  ? Alcohol use: Not Currently  ? Drug use: No  ? ? ?Social History  ? ?Substance and Sexual Activity  ?Sexual Activity Not Currently  ? ? ? ?Objective:  ? ?There were no vitals filed for this visit. ? ?There is no height or weight on file to calculate BMI. ? ? ?Physical Exam ?Vitals reviewed.  ?Pulmonary:  ?   Effort: Pulmonary effort is normal.  ?   Comments: No shortness of breath detected in conversation.  ?Neurological:  ?   Mental Status: He is oriented to person, place, and time.  ?Psychiatric:     ?   Mood and Affect: Mood normal.     ?   Behavior: Behavior  normal.     ?   Thought Content: Thought content normal.     ?   Judgment: Judgment normal.  ? ? ? ?Lab Results ?Lab Results  ?Component Value Date  ? WBC 7.1 03/07/2022  ? HGB 13.0 03/07/2022  ? HCT 38.8 03/07/2022  ? MCV 95 03/07/2022  ? PLT 214 03/07/2022  ?  ?Lab Results  ?Component Value Date  ? CREATININE 1.87 (H) 03/07/2022  ? BUN 19 03/07/2022  ? NA 138 03/07/2022  ? K 4.1 03/07/2022  ? CL 105 03/07/2022  ? CO2 25 03/07/2022  ?  ?Lab Results  ?Component Value Date  ? ALT 13 01/10/2022  ? AST 16 01/10/2022  ? ALKPHOS 132 (H) 10/21/2018  ? BILITOT 0.5 01/10/2022  ?  ?Lab Results  ?Component Value Date  ? CHOL 231 (H) 01/10/2022  ? HDL 44 01/10/2022  ? LDLCALC 168 (H) 01/10/2022  ? TRIG 83 01/10/2022   ? CHOLHDL 5.3 (H) 01/10/2022  ? ?HIV 1 RNA Quant  ?Date Value  ?03/07/2022 NOT DETECTED copies/mL  ?01/10/2022 <40  ?12/06/2021 <40  ? ?CD4 T Cell Abs (/uL)  ?Date Value  ?08/24/2021 442  ?02/08/2021 322 (L)  ?09/20/2020 316 (L)  ? ? ?Assessment & Plan:  ? ?Problem List Items Addressed This Visit   ? ?  ? Unprioritized  ? HIV disease (HCC) (Chronic)  ?  Perfectly controlled on Biktarvy - will switch back to Dovato for TAF sparing regimen for kidney protection.  ?Reviewed labs with him over the phone and answered all questions.  ? ?  ?  ? Relevant Medications  ? dolutegravir-lamiVUDine (DOVATO) 50-300 MG tablet  ? Elevated serum creatinine  ?  Lab Results  ?Component Value Date  ? CREATININE 1.87 (H) 03/07/2022  ? CREATININE 1.74 (H) 01/10/2022  ? CREATININE 1.54 (H) 12/06/2021  ?Persistently elevated - switch back to Dovato to remove TAF confounder and refer to Nephrology for further assistance with work up.  ?  ?  ? Relevant Orders  ? Ambulatory referral to Nephrology  ? Neuropathy  ?  Suspect he has neuropathy based on description of burning pains now. Will trial PM gabapentin 300 mg. Can increase up to 3 times a day if he tolerates without too many side effects. Work note provided for recovery during acute flare - suggested he look into getting FMLA set up for job protection.  ? ?  ?  ? ?Other Visit Diagnoses   ? ? Asymptomatic HIV infection, with no history of HIV-related illness (HCC)    -  Primary  ? Relevant Medications  ? dolutegravir-lamiVUDine (DOVATO) 50-300 MG tablet  ? Nephropathy      ? Relevant Medications  ? gabapentin (NEURONTIN) 300 MG capsule  ? ?  ? ? ?Follow Up Instructions: ?As above  ?  ?I discussed the assessment and treatment plan with the patient. The patient was provided an opportunity to ask questions and all were answered. The patient agreed with the plan and demonstrated an understanding of the instructions. ?  ?The patient was advised to call back or seek an in-person evaluation if  the symptoms worsen or if the condition fails to improve as anticipated. ? ?I provided 16 minutes of non-face-to-face time during this encounter. ? ? ?Rexene Alberts, MSN, NP-C ?[facility] ?[facility]  ?[email or web address] ?Pager: [phone] ?Office: [phone] ?[facility] Main Line: [phone]  ? ? ?

## 2022-03-22 NOTE — Assessment & Plan Note (Signed)
Lab Results  ?Component Value Date  ? CREATININE 1.87 (H) 03/07/2022  ? CREATININE 1.74 (H) 01/10/2022  ? CREATININE 1.54 (H) 12/06/2021  ? ?Persistently elevated - switch back to Dovato to remove TAF confounder and refer to Nephrology for further assistance with work up.  ?

## 2022-03-22 NOTE — Assessment & Plan Note (Signed)
Perfectly controlled on Biktarvy - will switch back to Dovato for TAF sparing regimen for kidney protection.  ?Reviewed labs with him over the phone and answered all questions.  ?

## 2022-05-24 ENCOUNTER — Other Ambulatory Visit: Payer: Self-pay

## 2022-05-24 ENCOUNTER — Ambulatory Visit: Payer: Self-pay

## 2022-05-24 DIAGNOSIS — B20 Human immunodeficiency virus [HIV] disease: Secondary | ICD-10-CM

## 2022-05-25 ENCOUNTER — Encounter: Payer: Self-pay | Admitting: Infectious Diseases

## 2022-05-27 LAB — HIV-1 RNA QUANT-NO REFLEX-BLD
HIV 1 RNA Quant: NOT DETECTED Copies/mL
HIV-1 RNA Quant, Log: NOT DETECTED Log cps/mL

## 2022-06-14 ENCOUNTER — Other Ambulatory Visit: Payer: Self-pay

## 2022-06-14 ENCOUNTER — Encounter: Payer: Self-pay | Admitting: Infectious Diseases

## 2022-06-14 ENCOUNTER — Ambulatory Visit (INDEPENDENT_AMBULATORY_CARE_PROVIDER_SITE_OTHER): Payer: Self-pay | Admitting: Infectious Diseases

## 2022-06-14 DIAGNOSIS — G629 Polyneuropathy, unspecified: Secondary | ICD-10-CM

## 2022-06-14 DIAGNOSIS — Z79899 Other long term (current) drug therapy: Secondary | ICD-10-CM

## 2022-06-14 DIAGNOSIS — R7989 Other specified abnormal findings of blood chemistry: Secondary | ICD-10-CM

## 2022-06-14 DIAGNOSIS — B20 Human immunodeficiency virus [HIV] disease: Secondary | ICD-10-CM

## 2022-06-14 MED ORDER — PITAVASTATIN CALCIUM 4 MG PO TABS: 1.0000 | ORAL_TABLET | Freq: Every day | ORAL | 11 refills | Status: DC

## 2022-06-14 NOTE — Assessment & Plan Note (Signed)
Changed to TAF sparing regimen in consideration of this - unable to see nephrology at this time for further work up.  Has been stable with Cr 1.6-1.8. Will repeat Renal panel and urinalysis at next OV in December.

## 2022-06-14 NOTE — Progress Notes (Signed)
Name: Alex Mitchell  DOB: 15-Dec-1980 MRN: [id number] PCP: Barbette Merino, NP   VIRTUAL CARE ENCOUNTER  I connected with Alex Mitchell on 06/14/22 at  9:00 AM EDT by TELEPHONE and verified that I am speaking with the correct person using two identifiers.   I discussed the limitations, risks, security and privacy concerns of performing an evaluation and management service by telephone and the availability of in person appointments. I also discussed with the patient that there may be a patient responsible charge related to this service. The patient expressed understanding and agreed to proceed.  Patient Location: [facility]   Other Participants:   Provider Location: [facility] Office   Brief Narrative:  Alex Mitchell is a 41 y.o. 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  History of OIs: PJP pna, esophageal candidiasis   Previous Regimens: Biktarvy 09-2018  Dovato 03/2022   Genotypes: Not performed in hospital    Subjective:   Chief Complaint  Patient presents with   Follow-up    Discuss labs        HPI:  Alex Mitchell is doing well - no concerns today.   Burning feet pains are better - he has been doing well lately. Still working for [employer]    Review of Systems  Review of Systems   Outpatient Medications Prior to Visit  Medication Sig Dispense Refill   cetirizine (ZYRTEC) 10 MG tablet Take 1 tablet (10 mg total) by mouth daily. 30 tablet 11   Cranberry 250 MG CHEW Chew by mouth.     dolutegravir-lamiVUDine (DOVATO) 50-300 MG tablet Take 1 tablet by mouth daily. 30 tablet 11   ferrous sulfate 325 (65 FE) MG tablet Take 325 mg by mouth daily with breakfast.     gabapentin (NEURONTIN) 300 MG capsule Take 1 capsule (300 mg total) by mouth 3 (three) times daily. (Patient not taking: Reported on 06/14/2022) 90 capsule 0   methocarbamol (ROBAXIN) 500 MG tablet Take 1 tablet (500 mg total) by mouth every 6  (six) hours as needed for muscle spasms. (Patient not taking: Reported on 06/14/2022) 30 tablet 0   ondansetron (ZOFRAN-ODT) 4 MG disintegrating tablet Take 1 tablet (4 mg total) by mouth every 8 (eight) hours as needed for nausea or vomiting. (Patient not taking: Reported on 06/14/2022) 20 tablet 0   No facility-administered medications prior to visit.     No Known Allergies  Social History   Tobacco Use   Smoking status: Never   Smokeless tobacco: Never  Vaping Use   Vaping Use: Never used  Substance Use Topics   Alcohol use: Not Currently   Drug use: No    Social History   Substance and Sexual Activity  Sexual Activity Not Currently     Objective:   There were no vitals filed for this visit.  There is no height or weight on file to calculate BMI.   Physical Exam Vitals reviewed.  Pulmonary:     Effort: Pulmonary effort is normal.     Comments: No shortness of breath detected in conversation.  Neurological:     Mental Status: He is oriented to person, place, and time.  Psychiatric:        Mood and Affect: Mood normal.        Behavior: Behavior normal.        Thought Content: Thought content normal.        Judgment: Judgment normal.  Lab Results Lab Results  Component Value Date   WBC 7.1 03/07/2022   HGB 13.0 03/07/2022   HCT 38.8 03/07/2022   MCV 95 03/07/2022   PLT 214 03/07/2022    Lab Results  Component Value Date   CREATININE 1.87 (H) 03/07/2022   BUN 19 03/07/2022   NA 138 03/07/2022   K 4.1 03/07/2022   CL 105 03/07/2022   CO2 25 03/07/2022    Lab Results  Component Value Date   ALT 13 01/10/2022   AST 16 01/10/2022   ALKPHOS 132 (H) 10/21/2018   BILITOT 0.5 01/10/2022    Lab Results  Component Value Date   CHOL 231 (H) 01/10/2022   HDL 44 01/10/2022   LDLCALC 168 (H) 01/10/2022   TRIG 83 01/10/2022   CHOLHDL 5.3 (H) 01/10/2022   HIV 1 RNA Quant  Date Value  05/24/2022 Not Detected Copies/mL  03/07/2022 NOT DETECTED  copies/mL  01/10/2022 <40   CD4 T Cell Abs (/uL)  Date Value  08/24/2021 442  02/08/2021 322 (L)  09/20/2020 316 (L)    Assessment & Plan:   Problem List Items Addressed This Visit       Unprioritized   HIV disease (HCC) (Chronic)    Very well controlled on once daily Dovato. Viral load is not surprisingly still suppressed nicely with the med switch.  No concerns with access or adherence to medication. They are tolerating the medication well without side effects. No drug interactions identified.  Reminded about ADAP re-enrollment in July / January - current for this enrollment.  Vaccines discussed today - see health maintenance section.   With new research coming out by way of the REPRIEVE study regarding CVD risk reduction for PLWH, discussed that over the 8 year trial initiation of statin therapy was shown to reduce CV disease by 35% independent of other risk factors.  We discussed the patient's 10-year CVD Risk Score to be moderately elevated, and would benefit from intervention given HIV+ and > 17 yo.   Will start pitavastatin 4 mg daily given non-diabetic and no previous CV event on history.  CMP and current diabetes screen next office visit along with BP check   Next appt scheduled in person with labs prior to in December        Relevant Orders   Hemoglobin A1c   Renal Function Panel   Hepatic function panel   HIV 1 RNA quant-no reflex-bld   T-helper cells (CD4) count   RPR   Urine cytology ancillary only([place])   Neuropathy    Using gabapentin as needed at night for pain and does seem to help. He has plenty of medication available to him from last fill.       Elevated serum creatinine - Primary    Changed to TAF sparing regimen in consideration of this - unable to see nephrology at this time for further work up.  Has been stable with Cr 1.6-1.8. Will repeat Renal panel and urinalysis at next OV in December.       Relevant Orders   Urinalysis   Other  Visit Diagnoses     On statin therapy       Relevant Orders   Hepatic function panel   Lipid panel      Follow Up Instructions: As above    I discussed the assessment and treatment plan with the patient. The patient was provided an opportunity to ask questions and all were answered. The patient agreed with the plan  and demonstrated an understanding of the instructions.   The patient was advised to call back or seek an in-person evaluation if the symptoms worsen or if the condition fails to improve as anticipated.  I provided 11 minutes of non-face-to-face time during this encounter.   Rexene Alberts, MSN, NP-C [facility]  [email or web address] Pager: [phone] Office: [phone] [facility] Main Line: [phone]

## 2022-06-14 NOTE — Assessment & Plan Note (Signed)
Using gabapentin as needed at night for pain and does seem to help. He has plenty of medication available to him from last fill.

## 2022-06-14 NOTE — Assessment & Plan Note (Signed)
Very well controlled on once daily Dovato. Viral load is not surprisingly still suppressed nicely with the med switch.  No concerns with access or adherence to medication. They are tolerating the medication well without side effects. No drug interactions identified.  Reminded about ADAP re-enrollment in July / January - current for this enrollment.  Vaccines discussed today - see health maintenance section.   With new research coming out by way of the REPRIEVE study regarding CVD risk reduction for PLWH, discussed that over the 8 year trial initiation of statin therapy was shown to reduce CV disease by 35% independent of other risk factors.  We discussed the patient's 10-year CVD Risk Score to be moderately elevated, and would benefit from intervention given HIV+ and > 51 yo.   Will start pitavastatin 4 mg daily given non-diabetic and no previous CV event on history.  CMP and current diabetes screen next office visit along with BP check   Next appt scheduled in person with labs prior to in December

## 2022-09-05 ENCOUNTER — Encounter: Payer: Self-pay | Admitting: Nurse Practitioner

## 2022-09-05 ENCOUNTER — Ambulatory Visit (INDEPENDENT_AMBULATORY_CARE_PROVIDER_SITE_OTHER): Payer: Self-pay | Admitting: Nurse Practitioner

## 2022-09-05 VITALS — BP 136/80 | HR 66 | Temp 97.8°F | Ht 68.0 in | Wt 267.2 lb

## 2022-09-05 DIAGNOSIS — Z6841 Body Mass Index (BMI) 40.0 and over, adult: Secondary | ICD-10-CM

## 2022-09-05 DIAGNOSIS — Z23 Encounter for immunization: Secondary | ICD-10-CM | POA: Insufficient documentation

## 2022-09-05 DIAGNOSIS — E782 Mixed hyperlipidemia: Secondary | ICD-10-CM

## 2022-09-05 DIAGNOSIS — R7989 Other specified abnormal findings of blood chemistry: Secondary | ICD-10-CM

## 2022-09-05 DIAGNOSIS — Z0000 Encounter for general adult medical examination without abnormal findings: Secondary | ICD-10-CM | POA: Insufficient documentation

## 2022-09-05 DIAGNOSIS — E785 Hyperlipidemia, unspecified: Secondary | ICD-10-CM | POA: Insufficient documentation

## 2022-09-05 DIAGNOSIS — E669 Obesity, unspecified: Secondary | ICD-10-CM | POA: Insufficient documentation

## 2022-09-05 NOTE — Assessment & Plan Note (Addendum)
Wt Readings from Last 3 Encounters:  09/05/22 267 lb 3.2 oz (121.2 kg)  01/10/22 261 lb 9 oz (118.6 kg)  12/06/21 256 lb 9 oz (116.4 kg)  Patient reports that he could do better with his diet, drives for [employer] Increase intake of whole food consisting mainly vegetables and protein less carbohydrate drinking at least 64 ounces of water daily, importance of portion control also discussed.  Bennett feeds of healthy weight discussed with patient.

## 2022-09-05 NOTE — Assessment & Plan Note (Addendum)
Lab Results  Component Value Date   CHOL 231 (H) 01/10/2022   HDL 44 01/10/2022   LDLCALC 168 (H) 01/10/2022   TRIG 83 01/10/2022   CHOLHDL 5.3 (H) 01/10/2022  Currently on atorvastatin 4 mg daily avoid fried fatty foods Has upcoming appointment with labs with ID.

## 2022-09-05 NOTE — Assessment & Plan Note (Addendum)
Lab Results  Component Value Date   NA 138 03/07/2022   K 4.1 03/07/2022   CO2 25 03/07/2022   GLUCOSE 98 03/07/2022   BUN 19 03/07/2022   CREATININE 1.87 (H) 03/07/2022   CALCIUM 9.6 03/07/2022   EGFR 48 (L) 08/24/2021   GFRNONAA 48 (L) 12/07/2019  Has upcoming labs with ID Avoid NSAIDs and other nephrotoxic agents Patient will benefit from an SGLT2 if labs remain elevated at next lab check for  kidney protection  , states that ID is working on getting him to see a nephrologist

## 2022-09-05 NOTE — Assessment & Plan Note (Signed)
Annual exam as documented.  Counseling done include healthy lifestyle involving committing to 150 minutes of exercise per week, heart healthy diet, and attaining healthy weight. The importance of adequate sleep also discussed.  Regular use of seat belt and home safety were also discussed . Changes in health habits are decided on by patient with goals  for achieving them. Immunization  needs are specifically addressed at this visit.

## 2022-09-05 NOTE — Progress Notes (Signed)
New Patient Office Visit  Subjective:  Patient ID: Alex Mitchell, male    DOB: Mar 15, 1981  Age: 41 y.o. MRN: [id number]  CC:  Chief Complaint  Patient presents with   Follow-up    Needs flu shot.     HPI Alex Mitchell is a 41 y.o. male with past medical history of HIV disease, obesity who presents to establish care with new provider and for annual physical examination.  He has been following up regularly with the infectious disease specialist  Patient denies any adverse reactions to current medications taking all his medications as prescribed,  He denies any concerns today  Due for flu vaccine flu vaccine given in the office today        Past Medical History:  Diagnosis Date   AIDS (acquired immune deficiency syndrome) ([place]) 10/31/2018   [place]    Mouth sores     History reviewed. No pertinent surgical history.  Family History  Problem Relation Age of Onset   Hypertension Father    Hypertension Other     Social History   Socioeconomic History   Marital status: Single    Spouse name: Not on file   Number of children: Not on file   Years of education: Not on file   Highest education level: Not on file  Occupational History   Not on file  Tobacco Use   Smoking status: Never   Smokeless tobacco: Never  Vaping Use   Vaping Use: Never used  Substance and Sexual Activity   Alcohol use: Not Currently   Drug use: No   Sexual activity: Not Currently  Other Topics Concern   Not on file  Social History Narrative   Lives with his room mate    Social Determinants of Health   Financial Resource Strain: Not on file  Food Insecurity: Not on file  Transportation Needs: Not on file  Physical Activity: Not on file  Stress: Not on file  Social Connections: Not on file  Intimate Partner Violence: Not on file    ROS Review of Systems  Constitutional: Negative.  Negative for activity change, appetite change, chills, diaphoresis and fatigue.  HENT: Negative.   Negative for congestion and dental problem.   Eyes: Negative.   Respiratory:  Negative for apnea, choking and shortness of breath.   Cardiovascular: Negative.  Negative for chest pain, palpitations and leg swelling.  Gastrointestinal: Negative.  Negative for abdominal distention and abdominal pain.  Endocrine: Negative.   Genitourinary: Negative.   Musculoskeletal: Negative.   Allergic/Immunologic: Negative for food allergies and immunocompromised state.  Neurological: Negative.  Negative for dizziness, facial asymmetry and headaches.  Hematological: Negative.  Negative for adenopathy. Does not bruise/bleed easily.  Psychiatric/Behavioral: Negative.  Negative for agitation, behavioral problems and confusion.     Objective:   Today's Vitals: BP 136/80   Pulse 66   Temp 97.8 F (36.6 C) (Temporal)   Ht 5' 8"  (1.727 m)   Wt 267 lb 3.2 oz (121.2 kg)   SpO2 100%   BMI 40.63 kg/m   Physical Exam Constitutional:      General: He is not in acute distress.    Appearance: Normal appearance. He is obese. He is not ill-appearing, toxic-appearing or diaphoretic.  HENT:     Head: Normocephalic.     Right Ear: Tympanic membrane, ear canal and external ear normal. There is no impacted cerumen.     Left Ear: Tympanic membrane, ear canal and external ear normal. There is no impacted  cerumen.     Nose: Nose normal. No congestion or rhinorrhea.     Mouth/Throat:     Mouth: Mucous membranes are moist.     Pharynx: Oropharynx is clear. No oropharyngeal exudate or posterior oropharyngeal erythema.  Eyes:     General: No scleral icterus.       Right eye: No discharge.        Left eye: No discharge.     Extraocular Movements: Extraocular movements intact.     Conjunctiva/sclera: Conjunctivae normal.     Pupils: Pupils are equal, round, and reactive to light.  Neck:     Vascular: No carotid bruit.  Cardiovascular:     Rate and Rhythm: Normal rate and regular rhythm.     Pulses: Normal pulses.      Heart sounds: Normal heart sounds. No murmur heard.    No friction rub. No gallop.  Pulmonary:     Effort: Pulmonary effort is normal. No respiratory distress.     Breath sounds: Normal breath sounds. No stridor. No wheezing, rhonchi or rales.  Chest:     Chest wall: No tenderness.  Abdominal:     General: There is no distension.     Palpations: Abdomen is soft. There is no mass.     Tenderness: There is no abdominal tenderness. There is no right CVA tenderness, left CVA tenderness, guarding or rebound.     Hernia: No hernia is present.  Musculoskeletal:        General: No swelling, tenderness, deformity or signs of injury.     Cervical back: Normal range of motion and neck supple. No rigidity or tenderness.     Right lower leg: No edema.     Left lower leg: No edema.  Lymphadenopathy:     Cervical: No cervical adenopathy.  Skin:    General: Skin is warm and dry.     Capillary Refill: Capillary refill takes less than 2 seconds.     Coloration: Skin is not jaundiced or pale.     Findings: No bruising, erythema or lesion.  Neurological:     Mental Status: He is alert and oriented to person, place, and time.     Cranial Nerves: No cranial nerve deficit.     Sensory: No sensory deficit.     Motor: No weakness.     Coordination: Coordination normal.     Gait: Gait normal.  Psychiatric:        Mood and Affect: Mood normal.        Behavior: Behavior normal.        Thought Content: Thought content normal.        Judgment: Judgment normal.     Assessment & Plan:   Problem List Items Addressed This Visit       Other   Elevated serum creatinine    Lab Results  Component Value Date   NA 138 03/07/2022   K 4.1 03/07/2022   CO2 25 03/07/2022   GLUCOSE 98 03/07/2022   BUN 19 03/07/2022   CREATININE 1.87 (H) 03/07/2022   CALCIUM 9.6 03/07/2022   EGFR 48 (L) 08/24/2021   GFRNONAA 48 (L) 12/07/2019  Has upcoming labs with ID Avoid NSAIDs and other nephrotoxic  agents Patient will benefit from an SGLT2 if labs remain elevated at next lab check for  kidney protection  , states that ID is working on getting him to see a nephrologist       Annual physical exam - Primary  Annual exam as documented.  Counseling done include healthy lifestyle involving committing to 150 minutes of exercise per week, heart healthy diet, and attaining healthy weight. The importance of adequate sleep also discussed.  Regular use of seat belt and home safety were also discussed . Changes in health habits are decided on by patient with goals  for achieving them. Immunization  needs are specifically addressed at this visit.        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  Bennett feeds of healthy weight discussed with patient.      Need for immunization against influenza    Patient educated on CDC recommendation for the influenza vaccine. Verbal consent was obtained from the patient, vaccine administered by nurse, no sign of adverse reactions noted at this time. Patient education on arm soreness and use of tylenol for this patient  was discussed.       Relevant Orders   Flu Vaccine QUAD 60moIM (Fluarix, Fluzone & Alfiuria Quad PF) (Completed)   Hyperlipidemia    Lab Results  Component Value Date   CHOL 231 (H) 01/10/2022   HDL 44 01/10/2022   LDLCALC 168 (H) 01/10/2022   TRIG 83 01/10/2022   CHOLHDL 5.3 (H) 01/10/2022  Currently on atorvastatin 4 mg daily However fried fatty foods Has upcoming appointment with labs with ID.       Outpatient Encounter  Medications as of 09/05/2022  Medication Sig   cetirizine (ZYRTEC) 10 MG tablet Take 1 tablet (10 mg total) by mouth daily.   Cranberry 250 MG CHEW Chew by mouth.   dolutegravir-lamiVUDine (DOVATO) 50-300 MG tablet Take 1 tablet by mouth daily.   Pitavastatin Calcium 4 MG TABS Take 1 tablet (4 mg total) by mouth daily.   ferrous sulfate 325 (65 FE) MG tablet Take 325 mg by mouth daily with breakfast. (Patient not taking: Reported on 09/05/2022)   [DISCONTINUED] gabapentin (NEURONTIN) 300 MG capsule Take 1 capsule (300 mg total) by mouth 3 (three) times daily. (Patient not taking: Reported on 06/14/2022)   [DISCONTINUED] methocarbamol (ROBAXIN) 500 MG tablet Take 1 tablet (500 mg total) by mouth every 6 (six) hours as needed for muscle spasms. (Patient not taking: Reported on 06/14/2022)   [DISCONTINUED] ondansetron (ZOFRAN-ODT) 4 MG disintegrating tablet Take 1 tablet (4 mg total) by mouth every 8 (eight) hours as needed for nausea or vomiting. (Patient not taking: Reported on 06/14/2022)   No facility-administered encounter medications on file as of 09/05/2022.    Follow-up: Return in about 1 year (around 09/06/2023) for cpe.   FRenee Rival FNP

## 2022-09-05 NOTE — Assessment & Plan Note (Addendum)
Patient educated on CDC recommendation for the influenza  vaccine. Verbal consent was obtained from the patient, vaccine administered by nurse, no sign of adverse reactions noted at this time. Patient education on arm soreness and use of tylenol  for this patient  was discussed. 

## 2022-09-05 NOTE — Patient Instructions (Signed)

## 2022-09-06 LAB — TSH: TSH: 1.54 u[IU]/mL (ref 0.450–4.500)

## 2022-09-06 LAB — VITAMIN D 25 HYDROXY (VIT D DEFICIENCY, FRACTURES): Vit D, 25-Hydroxy: 25.8 ng/mL — ABNORMAL LOW (ref 30.0–100.0)

## 2022-09-07 ENCOUNTER — Other Ambulatory Visit: Payer: Self-pay | Admitting: Nurse Practitioner

## 2022-09-07 DIAGNOSIS — E559 Vitamin D deficiency, unspecified: Secondary | ICD-10-CM

## 2022-09-07 NOTE — Progress Notes (Signed)
Vitamin D level is low, start  OTC  vitamin D 1000 units daily  Normal thyroid function

## 2022-10-24 ENCOUNTER — Other Ambulatory Visit: Payer: Self-pay

## 2022-10-24 ENCOUNTER — Other Ambulatory Visit (HOSPITAL_COMMUNITY)
Admission: RE | Admit: 2022-10-24 | Discharge: 2022-10-24 | Disposition: A | Discharge status: Home / Self Care | Payer: Medicaid Other | Source: Ambulatory Visit | Attending: Infectious Diseases | Admitting: Infectious Diseases

## 2022-10-24 ENCOUNTER — Other Ambulatory Visit: Payer: Medicaid Other

## 2022-10-24 DIAGNOSIS — B20 Human immunodeficiency virus [HIV] disease: Secondary | ICD-10-CM | POA: Diagnosis present

## 2022-10-24 DIAGNOSIS — Z79899 Other long term (current) drug therapy: Secondary | ICD-10-CM

## 2022-10-24 DIAGNOSIS — R7989 Other specified abnormal findings of blood chemistry: Secondary | ICD-10-CM

## 2022-10-24 LAB — URINALYSIS
Bilirubin Urine: NEGATIVE
Glucose, UA: NEGATIVE
Hgb urine dipstick: NEGATIVE
Ketones, ur: NEGATIVE
Nitrite: NEGATIVE
Protein, ur: NEGATIVE
Specific Gravity, Urine: 1.02 (ref 1.001–1.035)
pH: 6.5 (ref 5.0–8.0)

## 2022-10-25 ENCOUNTER — Other Ambulatory Visit: Payer: Medicaid Other

## 2022-10-25 LAB — T-HELPER CELLS (CD4) COUNT (NOT AT ARMC)
CD4 % Helper T Cell: 15 % — ABNORMAL LOW (ref 33–65)
CD4 T Cell Abs: 424 /uL (ref 400–1790)

## 2022-10-25 LAB — URINE CYTOLOGY ANCILLARY ONLY
Chlamydia: NEGATIVE
Comment: NEGATIVE
Comment: NORMAL
Neisseria Gonorrhea: NEGATIVE

## 2022-10-26 ENCOUNTER — Telehealth: Payer: Medicaid Other | Admitting: Physician Assistant

## 2022-10-26 DIAGNOSIS — R6889 Other general symptoms and signs: Secondary | ICD-10-CM | POA: Diagnosis not present

## 2022-10-26 MED ORDER — ALBUTEROL SULFATE HFA 108 (90 BASE) MCG/ACT IN AERS: 1.0000 | INHALATION_SPRAY | Freq: Four times a day (QID) | RESPIRATORY_TRACT | 0 refills | Status: AC | PRN

## 2022-10-26 MED ORDER — BENZONATATE 100 MG PO CAPS: 100.0000 mg | ORAL_CAPSULE | Freq: Three times a day (TID) | ORAL | 0 refills | Status: DC | PRN

## 2022-10-26 MED ORDER — FLUTICASONE PROPIONATE 50 MCG/ACT NA SUSP: 2.0000 | Freq: Every day | NASAL | 0 refills | Status: DC

## 2022-10-26 NOTE — Progress Notes (Signed)
E visit for Flu like symptoms   We are sorry that you are not feeling well.  Here is how we plan to help! Based on what you have shared with me it looks like you may have flu-like symptoms that should be watched but do not seem to indicate anti-viral treatment.  Influenza or "the flu" is   an infection caused by a respiratory virus. The flu virus is highly contagious and persons who did not receive their yearly flu vaccination may "catch" the flu from close contact.  We have anti-viral medications to treat the viruses that cause this infection. They are not a "cure" and only shorten the course of the infection. These prescriptions are most effective when they are given within the first 2 days of "flu" symptoms. Antiviral medication are indicated if you have a high risk of complications from the flu. You should  also consider an antiviral medication if you are in close contact with someone who is at risk. These medications can help patients avoid complications from the flu  but have side effects that you should know. Possible side effects from Tamiflu or oseltamivir include nausea, vomiting, diarrhea, dizziness, headaches, eye redness, sleep problems or other respiratory symptoms. You should not take Tamiflu if you have an allergy to oseltamivir or any to the ingredients in Tamiflu.  Based upon your symptoms and potential risk factors I recommend that you follow the flu symptoms recommendation that I have listed below.  This is an infection that is most likely caused by a virus. There are no specific treatments other than to help you with the symptoms until the infection runs its course.  We are sorry you are not feeling well.  Here is how we plan to help!   For nasal congestion, you may use an oral decongestants such as Mucinex D or if you have glaucoma or high blood pressure use plain Mucinex.  Saline nasal spray or nasal drops can help and can safely be used as often as needed for congestion.  For  your congestion, I have prescribed Fluticasone nasal spray one spray in each nostril twice a day  If you do not have a history of heart disease, hypertension, diabetes or thyroid disease, prostate/bladder issues or glaucoma, you may also use Sudafed to treat nasal congestion.  It is highly recommended that you consult with a pharmacist or your primary care physician to ensure this medication is safe for you to take.     If you have a cough, you may use cough suppressants such as Delsym and Robitussin.  If you have glaucoma or high blood pressure, you can also use Coricidin HBP.   For cough I have prescribed for you A prescription cough medication called Tessalon Perles 100 mg. You may take 1-2 capsules every 8 hours as needed for cough  If you have a sore or scratchy throat, use a saltwater gargle-  to  teaspoon of salt dissolved in a 4-ounce to 8-ounce glass of warm water.  Gargle the solution for approximately 15-30 seconds and then spit.  It is important not to swallow the solution.  You can also use throat lozenges/cough drops and Chloraseptic spray to help with throat pain or discomfort.  Warm or cold liquids can also be helpful in relieving throat pain.  For headache, pain or general discomfort, you can use Ibuprofen or Tylenol as directed.   Some authorities believe that zinc sprays or the use of Echinacea may shorten the course of your symptoms.  I have prescribed Albuterol inhaler for wheezing. Use 1-2 puffs every 6 hours as needed for wheezing and shortness of breath.   ANYONE WHO HAS FLU SYMPTOMS SHOULD: Stay home. The flu is highly contagious and going out or to work exposes others! Be sure to drink plenty of fluids. Water is fine as well as fruit juices, sodas and electrolyte beverages. You may want to stay away from caffeine or alcohol. If you are nauseated, try taking small sips of liquids. How do you know if you are getting enough fluid? Your urine should be a pale yellow or almost  colorless. Get rest. Taking a steamy shower or using a humidifier may help nasal congestion and ease sore throat pain. Using a saline nasal spray works much the same way. Cough drops, hard candies and sore throat lozenges may ease your cough. Line up a caregiver. Have someone check on you regularly.   GET HELP RIGHT AWAY IF: You cannot keep down liquids or your medications. You become short of breath Your fell like you are going to pass out or loose consciousness. Your symptoms persist after you have completed your treatment plan MAKE SURE YOU  Understand these instructions. Will watch your condition. Will get help right away if you are not doing well or get worse.  Your e-visit answers were reviewed by a board certified advanced clinical practitioner to complete your personal care plan.  Depending on the condition, your plan could have included both over the counter or prescription medications.  If there is a problem please reply  once you have received a response from your provider.  Your safety is important to [place].  If you have drug allergies check your prescription carefully.    You can use MyChart to ask questions about today's visit, request a non-urgent call back, or ask for a work or school excuse for 24 hours related to this e-Visit. If it has been greater than 24 hours you will need to follow up with your provider, or enter a new e-Visit to address those concerns.  You will get an e-mail in the next two days asking about your experience.  I hope that your e-visit has been valuable and will speed your recovery. Thank you for using e-visits.  I have spent 5 minutes in review of e-visit questionnaire, review and updating patient chart, medical decision making and response to patient.   Margaretann Loveless, PA-C

## 2022-10-28 LAB — RENAL FUNCTION PANEL
Albumin: 4.6 g/dL (ref 3.6–5.1)
BUN/Creatinine Ratio: 10 (calc) (ref 6–22)
BUN: 17 mg/dL (ref 7–25)
CO2: 26 mmol/L (ref 20–32)
Calcium: 9.2 mg/dL (ref 8.6–10.3)
Chloride: 104 mmol/L (ref 98–110)
Creat: 1.74 mg/dL — ABNORMAL HIGH (ref 0.60–1.29)
Glucose, Bld: 96 mg/dL (ref 65–99)
Phosphorus: 3.1 mg/dL (ref 2.5–4.5)
Potassium: 4.3 mmol/L (ref 3.5–5.3)
Sodium: 138 mmol/L (ref 135–146)

## 2022-10-28 LAB — HEPATIC FUNCTION PANEL
AG Ratio: 1.7 (calc) (ref 1.0–2.5)
ALT: 11 U/L (ref 9–46)
AST: 18 U/L (ref 10–40)
Albumin: 4.6 g/dL (ref 3.6–5.1)
Alkaline phosphatase (APISO): 49 U/L (ref 36–130)
Bilirubin, Direct: 0.1 mg/dL (ref 0.0–0.2)
Globulin: 2.7 g/dL (calc) (ref 1.9–3.7)
Indirect Bilirubin: 0.5 mg/dL (calc) (ref 0.2–1.2)
Total Bilirubin: 0.6 mg/dL (ref 0.2–1.2)
Total Protein: 7.3 g/dL (ref 6.1–8.1)

## 2022-10-28 LAB — HIV-1 RNA QUANT-NO REFLEX-BLD
HIV 1 RNA Quant: NOT DETECTED Copies/mL
HIV-1 RNA Quant, Log: NOT DETECTED Log cps/mL

## 2022-10-28 LAB — LIPID PANEL
Cholesterol: 217 mg/dL — ABNORMAL HIGH (ref <200)
HDL: 37 mg/dL — ABNORMAL LOW (ref >=40)
LDL Cholesterol (Calc): 158 mg/dL (calc) — ABNORMAL HIGH
Non-HDL Cholesterol (Calc): 180 mg/dL (calc) — ABNORMAL HIGH (ref <130)
Total CHOL/HDL Ratio: 5.9 (calc) — ABNORMAL HIGH (ref <5.0)
Triglycerides: 103 mg/dL (ref <150)

## 2022-10-28 LAB — HEMOGLOBIN A1C
Hgb A1c MFr Bld: 5.8 % of total Hgb — ABNORMAL HIGH (ref <5.7)
Mean Plasma Glucose: 120 mg/dL
eAG (mmol/L): 6.6 mmol/L

## 2022-10-28 LAB — RPR: RPR Ser Ql: NONREACTIVE

## 2022-10-29 ENCOUNTER — Ambulatory Visit: Payer: Medicaid Other | Admitting: Nurse Practitioner

## 2022-11-01 ENCOUNTER — Encounter: Payer: Medicaid Other | Admitting: Infectious Diseases

## 2022-11-08 ENCOUNTER — Ambulatory Visit (INDEPENDENT_AMBULATORY_CARE_PROVIDER_SITE_OTHER): Payer: Medicaid Other | Admitting: Infectious Diseases

## 2022-11-08 ENCOUNTER — Encounter: Payer: Self-pay | Admitting: Infectious Diseases

## 2022-11-08 ENCOUNTER — Other Ambulatory Visit: Payer: Self-pay

## 2022-11-08 ENCOUNTER — Ambulatory Visit (INDEPENDENT_AMBULATORY_CARE_PROVIDER_SITE_OTHER): Payer: Medicaid Other

## 2022-11-08 VITALS — BP 136/85 | HR 67 | Temp 98.7°F | Ht 68.0 in | Wt 267.0 lb

## 2022-11-08 DIAGNOSIS — R7303 Prediabetes: Secondary | ICD-10-CM | POA: Diagnosis not present

## 2022-11-08 DIAGNOSIS — B20 Human immunodeficiency virus [HIV] disease: Secondary | ICD-10-CM

## 2022-11-08 DIAGNOSIS — S8992XA Unspecified injury of left lower leg, initial encounter: Secondary | ICD-10-CM | POA: Diagnosis present

## 2022-11-08 DIAGNOSIS — Z23 Encounter for immunization: Secondary | ICD-10-CM | POA: Diagnosis present

## 2022-11-08 DIAGNOSIS — R7989 Other specified abnormal findings of blood chemistry: Secondary | ICD-10-CM | POA: Diagnosis not present

## 2022-11-08 NOTE — Assessment & Plan Note (Signed)
Very well controlled on once daily dovato. No concerns with access or adherence to medication. They are tolerating the medication well without side effects. No drug interactions identified. Pertinent lab tests ordered today.   No changes to insurance coverage.  No dental needs today.  No concern over anxious/depressed mood.  Sexual health and family planning discussed - no needs discussed today.  Vaccines updated today - see health maintenance section.   Return in about 6 months (around 05/10/2023).

## 2022-11-08 NOTE — Patient Instructions (Addendum)
  For the knee,  Rest as much as you can  Compression sleeve or ACE bandage wrapped around  Ice it 4 times a day for 15 minutes.  Will place a referral for sports medicine.   Topical cream / gel diclofenac four times a day - they have this at the pharmacy in our building cheaper than at [facility] or the other pharmacies. It will say "arthritis relief" but it should work well for you.   Can take tylenol 2-3 times a day 650-500 mg   Would not do ibuprofen with your kidneys.    Your blood work shows concern for pre-diabetes.   6 month return visit - can do virtual at that time if you prefer.

## 2022-11-08 NOTE — Assessment & Plan Note (Addendum)
Point tenderness along medial quad tendon - no signs of joint instability. No boney tenderness along the joint at all. Quad tendon injury/bruise vs suprapatellar bursitis/injury. Discussed topical diclofenac, tylenol (avoid systemic NSAIDS with CKD) and rest, ice compression. Will write him out of work today and tomorrow.  Amb referral to sports medicine for assistance as well if this does not seem to improve with above measures.

## 2022-11-08 NOTE — Assessment & Plan Note (Signed)
Place referral for nephrology for work up now that he has acces to [facility].  BPs > 140/90 Recently pre-diabetic.  HIV treatment more kidney safe option and does not contain TAF/TDF.   Lab Results  Component Value Date   CREATININE 1.74 (H) 10/24/2022   CREATININE 1.87 (H) 03/07/2022   CREATININE 1.74 (H) 01/10/2022

## 2022-11-08 NOTE — Progress Notes (Signed)
Name: Alex Mitchell  DOB: 1981/01/27 MRN: [id number] PCP: Donell Beers, FNP    Brief Narrative:  Alex Mitchell is a 41 y.o. 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  History of OIs: PJP pna, esophageal candidiasis   Previous Regimens: Biktarvy 09-2018  Dovato    Genotypes: Not performed in hospital    Subjective:   Chief Complaint  Patient presents with   Follow-up      HPI:  Alex Mitchell is here for follow up care for HIV treatment. He continues to do very well on Dovato once daily without concern for missed doses, side effect or access to his medication. He has full [place] Medicaid now. Working with PCP and recently with diagnosis of pre-diabetes. Creatinine 1.78.   He has an acute injury of the left knee on Saturday 12/16. 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 Laceration to the right anterior lower shin and hit the knee - this has healed up nicely and scabbed over. No problems here. The left knee however has since Monday continued to feel tight and swollen. Painful when he walks on it to where he has to modify how he works and gets out of the truck. Has not tried anything to remedy the discomfort yet.  He has been working through it to help with finances and due to peak of delivery season.    Wt Readings from Last 3 Encounters:  11/08/22 267 lb (121.1 kg)  09/05/22 267 lb 3.2 oz (121.2 kg)  01/10/22 261 lb 9 oz (118.6 kg)    Review of Systems  Musculoskeletal:  Positive for gait problem and joint swelling.  All other systems reviewed and are negative.      Outpatient Medications Prior to Visit  Medication Sig Dispense Refill   albuterol (VENTOLIN HFA) 108 (90 Base) MCG/ACT inhaler Inhale 1-2 puffs into the lungs every 6 (six) hours as needed. 8 g 0   benzonatate (TESSALON) 100 MG capsule Take 1 capsule (100 mg total) by mouth 3 (three) times daily as needed.  30 capsule 0   cetirizine (ZYRTEC) 10 MG tablet Take 1 tablet (10 mg total) by mouth daily. 30 tablet 11   Cranberry 250 MG CHEW Chew by mouth.     dolutegravir-lamiVUDine (DOVATO) 50-300 MG tablet Take 1 tablet by mouth daily. 30 tablet 11   fluticasone (FLONASE) 50 MCG/ACT nasal spray Place 2 sprays into both nostrils daily. 16 g 0   Pitavastatin Calcium 4 MG TABS Take 1 tablet (4 mg total) by mouth daily. 30 tablet 11   ferrous sulfate 325 (65 FE) MG tablet Take 325 mg by mouth daily with breakfast. (Patient not taking: Reported on 09/05/2022)     No facility-administered medications prior to visit.     No Known Allergies  Social History   Tobacco Use   Smoking status: Never   Smokeless tobacco: Never  Vaping Use   Vaping Use: Never used  Substance Use Topics   Alcohol use: Not Currently   Drug use: No    Social History   Substance and Sexual Activity  Sexual Activity Not Currently     Objective:   Vitals:   11/08/22 0945  BP: 136/85  Pulse: 67  Temp: 98.7 F (37.1 C)  TempSrc: Temporal  SpO2: 99%  Weight: 267 lb (121.1 kg)  Height: 5\' 8"  (1.727 m)    Body mass index  is 40.6 kg/m.   Physical Exam Vitals reviewed.  Constitutional:      Appearance: Normal appearance. He is not ill-appearing.  Cardiovascular:     Rate and Rhythm: Normal rate.  Pulmonary:     Effort: Pulmonary effort is normal.  Abdominal:     General: Bowel sounds are normal. There is no distension.  Musculoskeletal:     Left knee: Swelling and effusion present. No erythema, lacerations or bony tenderness. Normal range of motion. Tenderness present. Normal meniscus.     Instability Tests: Anterior drawer test negative. Posterior drawer test negative. Anterior Lachman test negative. Medial McMurray test negative and lateral McMurray test negative.       Legs:  Skin:    General: Skin is warm and dry.     Comments: Small laceration that is healing on right lower anterior shin. Clean. No  signs of infection. Open to air.   Neurological:     Mental Status: He is alert.      Lab Results Lab Results  Component Value Date   WBC 7.1 03/07/2022   HGB 13.0 03/07/2022   HCT 38.8 03/07/2022   MCV 95 03/07/2022   PLT 214 03/07/2022    Lab Results  Component Value Date   CREATININE 1.74 (H) 10/24/2022   BUN 17 10/24/2022   NA 138 10/24/2022   K 4.3 10/24/2022   CL 104 10/24/2022   CO2 26 10/24/2022    Lab Results  Component Value Date   ALT 11 10/24/2022   AST 18 10/24/2022   ALKPHOS 132 (H) 10/21/2018   BILITOT 0.6 10/24/2022    Lab Results  Component Value Date   CHOL 217 (H) 10/24/2022   HDL 37 (L) 10/24/2022   LDLCALC 158 (H) 10/24/2022   TRIG 103 10/24/2022   CHOLHDL 5.9 (H) 10/24/2022   HIV 1 RNA Quant  Date Value  10/24/2022 Not Detected Copies/mL  05/24/2022 Not Detected Copies/mL  03/07/2022 NOT DETECTED copies/mL   CD4 T Cell Abs (/uL)  Date Value  10/24/2022 424  08/24/2021 442  02/08/2021 322 (L)    Assessment & Plan:   Problem List Items Addressed This Visit       Unprioritized   HIV disease (HCC) (Chronic)    Very well controlled on once daily dovato. No concerns with access or adherence to medication. They are tolerating the medication well without side effects. No drug interactions identified. Pertinent lab tests ordered today.   No changes to insurance coverage.  No dental needs today.  No concern over anxious/depressed mood.  Sexual health and family planning discussed - no needs discussed today.  Vaccines updated today - see health maintenance section.   Return in about 6 months (around 05/10/2023).        Elevated serum creatinine    Place referral for nephrology for work up now that he has acces to [facility].  BPs > 140/90 Recently pre-diabetic.  HIV treatment more kidney safe option and does not contain TAF/TDF.   Lab Results  Component Value Date   CREATININE 1.74 (H) 10/24/2022   CREATININE 1.87 (H) 03/07/2022    CREATININE 1.74 (H) 01/10/2022        Relevant Orders   Ambulatory referral to Nephrology   Left knee injury, initial encounter - Primary    Point tenderness along medial quad tendon - no signs of joint instability. No boney tenderness along the joint at all. Quad tendon injury/bruise vs suprapatellar bursitis/injury. Discussed topical diclofenac, tylenol (avoid systemic NSAIDS  with CKD) and rest, ice compression. Will write him out of work today and tomorrow.  Amb referral to sports medicine for assistance as well if this does not seem to improve with above measures.       Relevant Orders   AMB referral to sports medicine   Prediabetes    He is very interested in injectable GLP/GIP agonists. I think this would be very effective for him and would have no reservations with his HIV treatment.  He will discuss with his PCP at their next OV.         Rexene Alberts, MSN, NP-C [facility] for Infectious Disease [facility]  [email or web address] Pager: [phone] Office: [phone] [facility] Main Line: [phone]

## 2022-11-08 NOTE — Assessment & Plan Note (Signed)
He is very interested in injectable GLP/GIP agonists. I think this would be very effective for him and would have no reservations with his HIV treatment.  He will discuss with his PCP at their next OV.

## 2022-12-18 NOTE — Progress Notes (Unsigned)
[place] Phone: [phone] Subjective:   Fontaine No, am serving as a scribe for Dr. Hulan Saas.  I'm seeing this patient by the request  of:  Paseda, Folashade R, FNP  CC: Left knee pain  [id number]  Ryot Burrous is a 42 y.o. male coming in with complaint of L knee pain. 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 Notes pain and swelling in anterior aspect of his knee. Using Tylenol and icing. No history of knee pain. Painful with deep knee bending.        Past Medical History:  Diagnosis Date   AIDS (acquired immune deficiency syndrome) ([place]) 10/31/2018   [place]    Mouth sores    No past surgical history on file. Social History   Socioeconomic History   Marital status: Single    Spouse name: Not on file   Number of children: Not on file   Years of education: Not on file   Highest education level: Not on file  Occupational History   Not on file  Tobacco Use   Smoking status: Never   Smokeless tobacco: Never  Vaping Use   Vaping Use: Never used  Substance and Sexual Activity   Alcohol use: Not Currently   Drug use: No   Sexual activity: Not Currently  Other Topics Concern   Not on file  Social History Narrative   Lives with his room mate    Social Determinants of Health   Financial Resource Strain: Not on file  Food Insecurity: Not on file  Transportation Needs: Not on file  Physical Activity: Not on file  Stress: Not on file  Social Connections: Not on file   No Known Allergies Family History  Problem Relation Age of Onset   Hypertension Father    Hypertension Other      Current Outpatient Medications (Cardiovascular):    Pitavastatin Calcium 4 MG TABS, Take 1 tablet (4 mg total) by mouth daily.  Current Outpatient Medications (Respiratory):    albuterol (VENTOLIN HFA) 108 (90 Base) MCG/ACT inhaler, Inhale 1-2 puffs  into the lungs every 6 (six) hours as needed.   benzonatate (TESSALON) 100 MG capsule, Take 1 capsule (100 mg total) by mouth 3 (three) times daily as needed.   cetirizine (ZYRTEC) 10 MG tablet, Take 1 tablet (10 mg total) by mouth daily.   fluticasone (FLONASE) 50 MCG/ACT nasal spray, Place 2 sprays into both nostrils daily.   Current Outpatient Medications (Hematological):    ferrous sulfate 325 (65 FE) MG tablet, Take 325 mg by mouth daily with breakfast.  Current Outpatient Medications (Other):    Cranberry 250 MG CHEW, Chew by mouth.   dolutegravir-lamiVUDine (DOVATO) 50-300 MG tablet, Take 1 tablet by mouth daily.   Reviewed prior external information including notes and imaging from  primary care provider As well as notes that were available from care everywhere and other healthcare systems.  Past medical history, social, surgical and family history all reviewed in electronic medical record.  No pertanent information unless stated regarding to the chief complaint.   Review of Systems:  No headache, visual changes, nausea, vomiting, diarrhea, constipation, dizziness, abdominal pain, skin rash, fevers, chills, night sweats, weight loss, swollen lymph nodes, body aches,  chest pain, shortness of breath, mood changes. POSITIVE muscle aches, joint swelling  Objective  Blood pressure 110/78, pulse 63, height 5\' 8"  (1.727 m),  weight 266 lb (120.7 kg), SpO2 99 %.   General: No apparent distress alert and oriented x3 mood and affect normal, dressed appropriately.  HEENT: Pupils equal, extraocular movements intact  Respiratory: Patient's speak in full sentences and does not appear short of breath  Cardiovascular: No lower extremity edema, non tender, no erythema  Left knee exam does have significant limited range of motion secondary to recent significant effusion noted.  Unable to do significant testing secondary to the swelling and the discomfort patient is having.  Procedure: Real-time  Ultrasound Guided Injection of left knee Device: GE Logiq Q7 Ultrasound guided injection is preferred based studies that show increased duration, increased effect, greater accuracy, decreased procedural pain, increased response rate, and decreased cost with ultrasound guided versus blind injection.  Verbal informed consent obtained.  Time-out conducted.  Noted no overlying erythema, induration, or other signs of local infection.  Skin prepped in a sterile fashion.  Local anesthesia: Topical Ethyl chloride.  With sterile technique and under real time ultrasound guidance: With a 22-gauge 2 inch needle patient was injected with 4 cc of 0.5% Marcaine and aspirated 100 cc of straw-colored fluid then injected 1 cc of Kenalog 40 mg/dL. This was from a superior lateral approach.  Completed without difficulty  Pain immediately resolved suggesting accurate placement of the medication.  Advised to call if fevers/chills, erythema, induration, drainage, or persistent bleeding.  Impression: Technically successful ultrasound guided injection.    Impression and Recommendations:     The above documentation has been reviewed and is accurate and complete Lyndal Pulley, DO

## 2022-12-19 ENCOUNTER — Ambulatory Visit (INDEPENDENT_AMBULATORY_CARE_PROVIDER_SITE_OTHER): Payer: Medicaid Other | Admitting: Family Medicine

## 2022-12-19 ENCOUNTER — Ambulatory Visit: Payer: Self-pay

## 2022-12-19 ENCOUNTER — Ambulatory Visit (INDEPENDENT_AMBULATORY_CARE_PROVIDER_SITE_OTHER): Payer: Medicaid Other

## 2022-12-19 VITALS — BP 110/78 | HR 63 | Ht 68.0 in | Wt 266.0 lb

## 2022-12-19 DIAGNOSIS — M25562 Pain in left knee: Secondary | ICD-10-CM

## 2022-12-19 DIAGNOSIS — M25462 Effusion, left knee: Secondary | ICD-10-CM

## 2022-12-19 NOTE — Patient Instructions (Addendum)
Drained knee today Compression sleeve until we see you Ice 20 min 2x a day Xray  Will get better in a couple of week See me in 5-6 weeks

## 2022-12-19 NOTE — Assessment & Plan Note (Signed)
Patient did have aspiration of the knee done today.  Tolerated.  Patient still had some swelling at the end of the steroid.  Seems to be beneficial.  X-rays show a questionable old tibial plateau injury but seems to be relatively okay at the moment.  We discussed because of some of the consistency of the synovial fluid we will send to the lab to further evaluate if gout is potentially playing a role.  We discussed with patient about icing regimen and home exercises.  Worsening instability advanced imaging abnormality.  Follow-up with me after home exercises compression and icing in 4 weeks.

## 2022-12-20 LAB — SYNOVIAL FLUID ANALYSIS, COMPLETE
Basophils, %: 0 %
Eosinophils-Synovial: 0 % (ref 0–2)
Lymphocytes-Synovial Fld: 38 % (ref 0–74)
Monocyte/Macrophage: 62 % (ref 0–69)
Neutrophil, Synovial: 0 % (ref 0–24)
Synoviocytes, %: 0 % (ref 0–15)
WBC, Synovial: 410 cells/uL — ABNORMAL HIGH (ref <150)

## 2022-12-24 ENCOUNTER — Telehealth: Payer: Medicaid Other | Admitting: Family Medicine

## 2022-12-24 DIAGNOSIS — J069 Acute upper respiratory infection, unspecified: Secondary | ICD-10-CM | POA: Diagnosis not present

## 2022-12-24 MED ORDER — BENZONATATE 100 MG PO CAPS: 100.0000 mg | ORAL_CAPSULE | Freq: Two times a day (BID) | ORAL | 0 refills | Status: DC | PRN

## 2022-12-24 NOTE — Progress Notes (Signed)

## 2023-01-22 ENCOUNTER — Other Ambulatory Visit: Payer: Self-pay | Admitting: Infectious Diseases

## 2023-01-22 DIAGNOSIS — Z21 Asymptomatic human immunodeficiency virus [HIV] infection status: Secondary | ICD-10-CM

## 2023-01-23 NOTE — Progress Notes (Deleted)
[place] Phone: [phone] Subjective:    I'm seeing this patient by the request  of:  Renee Rival, FNP  CC:   [id number]  12/19/2022 Patient did have aspiration of the knee done today. Tolerated. Patient still had some swelling at the end of the steroid. Seems to be beneficial. X-rays show a questionable old tibial plateau injury but seems to be relatively okay at the moment. We discussed because of some of the consistency of the synovial fluid we will send to the lab to further evaluate if gout is potentially playing a role. We discussed with patient about icing regimen and home exercises. Worsening instability advanced imaging abnormality. Follow-up with me after home exercises compression and icing in 4 weeks.   Update 01/24/2023 Alex Mitchell is a 42 y.o. male coming in with complaint of L knee pain. Patient states        Past Medical History:  Diagnosis Date   AIDS (acquired immune deficiency syndrome) ([place]) 10/31/2018   [place]    Mouth sores    No past surgical history on file. Social History   Socioeconomic History   Marital status: Single    Spouse name: Not on file   Number of children: Not on file   Years of education: Not on file   Highest education level: Not on file  Occupational History   Not on file  Tobacco Use   Smoking status: Never   Smokeless tobacco: Never  Vaping Use   Vaping Use: Never used  Substance and Sexual Activity   Alcohol use: Not Currently   Drug use: No   Sexual activity: Not Currently  Other Topics Concern   Not on file  Social History Narrative   Lives with his room mate    Social Determinants of Health   Financial Resource Strain: Not on file  Food Insecurity: Not on file  Transportation Needs: Not on file  Physical Activity: Not on file  Stress: Not on file  Social Connections: Not on file   No Known Allergies Family History  Problem  Relation Age of Onset   Hypertension Father    Hypertension Other      Current Outpatient Medications (Cardiovascular):    Pitavastatin Calcium 4 MG TABS, Take 1 tablet (4 mg total) by mouth daily.  Current Outpatient Medications (Respiratory):    albuterol (VENTOLIN HFA) 108 (90 Base) MCG/ACT inhaler, Inhale 1-2 puffs into the lungs every 6 (six) hours as needed.   benzonatate (TESSALON) 100 MG capsule, Take 1 capsule (100 mg total) by mouth 2 (two) times daily as needed for cough.   cetirizine (ZYRTEC) 10 MG tablet, Take 1 tablet (10 mg total) by mouth daily.   fluticasone (FLONASE) 50 MCG/ACT nasal spray, Place 2 sprays into both nostrils daily.   Current Outpatient Medications (Hematological):    ferrous sulfate 325 (65 FE) MG tablet, Take 325 mg by mouth daily with breakfast.  Current Outpatient Medications (Other):    Cranberry 250 MG CHEW, Chew by mouth.   dolutegravir-lamiVUDine (DOVATO) 50-300 MG tablet, Take 1 tablet by mouth daily.   Reviewed prior external information including notes and imaging from  primary care provider As well as notes that were available from care everywhere and other healthcare systems.  Past medical history, social, surgical and family history all reviewed in electronic medical record.  No pertanent information unless stated regarding to the chief complaint.   Review of Systems:  No headache, visual changes, nausea, vomiting, diarrhea, constipation, dizziness, abdominal pain, skin rash, fevers, chills, night sweats, weight loss, swollen lymph nodes, body aches, joint swelling, chest pain, shortness of breath, mood changes. POSITIVE muscle aches  Objective  There were no vitals taken for this visit.   General: No apparent distress alert and oriented x3 mood and affect normal, dressed appropriately.  HEENT: Pupils equal, extraocular movements intact  Respiratory: Patient's speak in full sentences and does not appear short of breath   Cardiovascular: No lower extremity edema, non tender, no erythema      Impression and Recommendations:

## 2023-01-24 ENCOUNTER — Ambulatory Visit: Payer: Medicaid Other | Admitting: Family Medicine

## 2023-02-20 ENCOUNTER — Other Ambulatory Visit: Payer: Self-pay | Admitting: Infectious Diseases

## 2023-02-20 DIAGNOSIS — Z21 Asymptomatic human immunodeficiency virus [HIV] infection status: Secondary | ICD-10-CM

## 2023-03-11 ENCOUNTER — Telehealth: Payer: Medicaid Other | Admitting: Physician Assistant

## 2023-03-11 DIAGNOSIS — K047 Periapical abscess without sinus: Secondary | ICD-10-CM | POA: Diagnosis not present

## 2023-03-12 MED ORDER — AMOXICILLIN-POT CLAVULANATE 875-125 MG PO TABS: 1.0000 | ORAL_TABLET | Freq: Two times a day (BID) | ORAL | 0 refills | Status: DC

## 2023-03-12 NOTE — Progress Notes (Signed)
E-Visit for Dental Pain  We are sorry that you are not feeling well.  Here is how we plan to help!  Based on what you have shared with me in the questionnaire, it sounds like you have a dental infection. I have prescribed Augmentin 875-125mg  twice a day for 7 days. Continue pain relievers recommended by dental provider and I want you to contact them to let them know what is going on in case they want to get you back in for re-evaluation.   It is imperative that you see a dentist within 10 days of this eVisit to determine the cause of the dental pain and be sure it is adequately treated  A toothache or tooth pain is caused when the nerve in the root of a tooth or surrounding a tooth is irritated. Dental (tooth) infection, decay, injury, or loss of a tooth are the most common causes of dental pain. Pain may also occur after an extraction (tooth is pulled out). Pain sometimes originates from other areas and radiates to the jaw, thus appearing to be tooth pain.Bacteria growing inside your mouth can contribute to gum disease and dental decay, both of which can cause pain. A toothache occurs from inflammation of the central portion of the tooth called pulp. The pulp contains nerve endings that are very sensitive to pain. Inflammation to the pulp or pulpitis may be caused by dental cavities, trauma, and infection.    HOME CARE:   For toothaches: Over-the-counter pain medications such as acetaminophen or ibuprofen may be used. Take these as directed on the package while you arrange for a dental appointment. Avoid very cold or hot foods, because they may make the pain worse. You may get relief from biting on a cotton ball soaked in oil of cloves. You can get oil of cloves at most drug stores.  For jaw pain:  Aspirin may be helpful for problems in the joint of the jaw in adults. If pain happens every time you open your mouth widely, the temporomandibular joint (TMJ) may be the source of the pain. Yawning  or taking a large bite of food may worsen the pain. An appointment with your doctor or dentist will help you find the cause.     GET HELP RIGHT AWAY IF:  You have a high fever or chills If you have had a recent head or face injury and develop headache, light headedness, nausea, vomiting, or other symptoms that concern you after an injury to your face or mouth, you could have a more serious injury in addition to your dental injury. A facial rash associated with a toothache: This condition may improve with medication. Contact your doctor for them to decide what is appropriate. Any jaw pain occurring with chest pain: Although jaw pain is most commonly caused by dental disease, it is sometimes referred pain from other areas. People with heart disease, especially people who have had stents placed, people with diabetes, or those who have had heart surgery may have jaw pain as a symptom of heart attack or angina. If your jaw or tooth pain is associated with lightheadedness, sweating, or shortness of breath, you should see a doctor as soon as possible. Trouble swallowing or excessive pain or bleeding from gums: If you have a history of a weakened immune system, diabetes, or steroid use, you may be more susceptible to infections. Infections can often be more severe and extensive or caused by unusual organisms. Dental and gum infections in people with these conditions  may require more aggressive treatment. An abscess may need draining or IV antibiotics, for example.  MAKE SURE YOU   Understand these instructions. Will watch your condition. Will get help right away if you are not doing well or get worse.  Thank you for choosing an e-visit.  Your e-visit answers were reviewed by a board certified advanced clinical practitioner to complete your personal care plan. Depending upon the condition, your plan could have included both over the counter or prescription medications.  Please review your pharmacy  choice. Make sure the pharmacy is open so you can pick up prescription now. If there is a problem, you may contact your provider through [employer] and have the prescription routed to another pharmacy.  Your safety is important to [place]. If you have drug allergies check your prescription carefully.   For the next 24 hours you can use MyChart to ask questions about today's visit, request a non-urgent call back, or ask for a work or school excuse. You will get an email in the next two days asking about your experience. I hope that your e-visit has been valuable and will speed your recovery.

## 2023-03-12 NOTE — Progress Notes (Signed)
I have spent 5 minutes in review of e-visit questionnaire, review and updating patient chart, medical decision making and response to patient.   Everleigh Colclasure Cody Megann Easterwood, PA-C    

## 2023-03-12 NOTE — Progress Notes (Signed)
[facility] [address] [place] [address] Phone: [phone] Subjective:   Alex Mitchell, am serving as a scribe for Dr. Antoine Primas.  I'm seeing this patient by the request  of:  Donell Beers, FNP  CC: left knee pain   [id number]  12/19/2022 Patient did have aspiration of the knee done today.  Tolerated.  Patient still had some swelling at the end of the steroid.  Seems to be beneficial.  X-rays show a questionable old tibial plateau injury but seems to be relatively okay at the moment.  We discussed because of some of the consistency of the synovial fluid we will send to the lab to further evaluate if gout is potentially playing a role.  We discussed with patient about icing regimen and home exercises.  Worsening instability advanced imaging abnormality.  Follow-up with me after home exercises compression and icing in 4 weeks.      Update 03/13/2023 Alex Mitchell is a 42 y.o. male coming in with complaint of L knee pain. 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 Little to no pain since last visit.        Past Medical History:  Diagnosis Date   AIDS (acquired immune deficiency syndrome) (HCC) 10/31/2018   Chancre    Mouth sores    No past surgical history on file. Social History   Socioeconomic History   Marital status: Single    Spouse name: Not on file   Number of children: Not on file   Years of education: Not on file   Highest education level: Not on file  Occupational History   Not on file  Tobacco Use   Smoking status: Never   Smokeless tobacco: Never  Vaping Use   Vaping Use: Never used  Substance and Sexual Activity   Alcohol use: Not Currently   Drug use: No   Sexual activity: Not Currently  Other Topics Concern   Not on file  Social History Narrative   Lives with his room mate    Social Determinants of Health   Financial Resource Strain: Not on file  Food  Insecurity: Not on file  Transportation Needs: Not on file  Physical Activity: Not on file  Stress: Not on file  Social Connections: Not on file   No Known Allergies Family History  Problem Relation Age of Onset   Hypertension Father    Hypertension Other      Current Outpatient Medications (Cardiovascular):    Pitavastatin Calcium 4 MG TABS, Take 1 tablet (4 mg total) by mouth daily.  Current Outpatient Medications (Respiratory):    albuterol (VENTOLIN HFA) 108 (90 Base) MCG/ACT inhaler, Inhale 1-2 puffs into the lungs every 6 (six) hours as needed.   benzonatate (TESSALON) 100 MG capsule, Take 1 capsule (100 mg total) by mouth 2 (two) times daily as needed for cough.   cetirizine (ZYRTEC) 10 MG tablet, Take 1 tablet (10 mg total) by mouth daily.   fluticasone (FLONASE) 50 MCG/ACT nasal spray, Place 2 sprays into both nostrils daily.   Current Outpatient Medications (Hematological):    ferrous sulfate 325 (65 FE) MG tablet, Take 325 mg by mouth daily with breakfast.  Current Outpatient Medications (Other):    amoxicillin-clavulanate (AUGMENTIN) 875-125 MG tablet, Take 1 tablet by mouth 2 (two) times daily.   Cranberry 250 MG CHEW, Chew by mouth.   dolutegravir-lamiVUDine (DOVATO) 50-300 MG tablet, TAKE 1 TABLET BY MOUTH DAILY  Objective  Blood pressure 120/78, pulse 67, height 5\' 8"  (1.727 m), weight 264 lb (119.7 kg), SpO2 98 %.   General: No apparent distress alert and oriented x3 mood and affect normal, dressed appropriately.  HEENT: Pupils equal, extraocular movements intact  Respiratory: Patient's speak in full sentences and does not appear short of breath  Cardiovascular: No lower extremity edema, non tender, no erythema  Left knee has some mild swelling still noted compared to the contralateral side.  There are some tenderness to palpation over the lateral aspect of the knee.  Limited muscular skeletal ultrasound was performed and interpreted by Antoine Primas, M   Limited ultrasound still shows the patient does have some hypoechoic changes still noted.  Unable to see the medial meniscus more that does have some mild protruding but no significant displacement or tear appreciated. Impression: Interval improvement in the effusion.    Impression and Recommendations:     The above documentation has been reviewed and is accurate and complete Judi Saa, DO

## 2023-03-13 ENCOUNTER — Ambulatory Visit (INDEPENDENT_AMBULATORY_CARE_PROVIDER_SITE_OTHER): Payer: Medicaid Other | Admitting: Family Medicine

## 2023-03-13 ENCOUNTER — Encounter: Payer: Self-pay | Admitting: Family Medicine

## 2023-03-13 ENCOUNTER — Other Ambulatory Visit: Payer: Self-pay

## 2023-03-13 VITALS — BP 120/78 | HR 67 | Ht 68.0 in | Wt 264.0 lb

## 2023-03-13 DIAGNOSIS — M25562 Pain in left knee: Secondary | ICD-10-CM | POA: Diagnosis not present

## 2023-03-13 DIAGNOSIS — M25462 Effusion, left knee: Secondary | ICD-10-CM | POA: Diagnosis not present

## 2023-03-13 NOTE — Patient Instructions (Signed)
Still some swelling  Wear compression with workouts Ice after activity See me again in 2-3 months

## 2023-03-13 NOTE — Assessment & Plan Note (Signed)
Patient does have some reaccumulation starting again in the knee.  We discussed with him at great length.  We discussed icing regimen and compression.  Patient is having no instability of the knee.  We discussed further imaging could be available if necessary.  Patient is now able to workout on a regular basis and do his job without any difficulties follow-up with me again in 2 to 3 months to make sure that there is no worsening reaccumulation RTC in 6 weeks

## 2023-04-19 NOTE — Telephone Encounter (Signed)
Pt is schedule for June the 5.

## 2023-04-24 ENCOUNTER — Encounter: Payer: Self-pay | Admitting: Nurse Practitioner

## 2023-04-24 ENCOUNTER — Ambulatory Visit: Payer: Medicaid Other | Admitting: Nurse Practitioner

## 2023-04-24 VITALS — BP 119/64 | HR 60 | Temp 97.8°F | Ht 68.0 in | Wt 259.6 lb

## 2023-04-24 DIAGNOSIS — Z6841 Body Mass Index (BMI) 40.0 and over, adult: Secondary | ICD-10-CM | POA: Diagnosis not present

## 2023-04-24 DIAGNOSIS — E782 Mixed hyperlipidemia: Secondary | ICD-10-CM | POA: Diagnosis not present

## 2023-04-24 DIAGNOSIS — R7989 Other specified abnormal findings of blood chemistry: Secondary | ICD-10-CM | POA: Diagnosis not present

## 2023-04-24 MED ORDER — PHENTERMINE HCL 15 MG PO CAPS
15.0000 mg | ORAL_CAPSULE | ORAL | 0 refills | Status: DC
Start: 2023-04-24 — End: 2023-05-22

## 2023-04-24 NOTE — Assessment & Plan Note (Signed)
Lab Results  Component Value Date   NA 138 10/24/2022   K 4.3 10/24/2022   CO2 26 10/24/2022   BUN 17 10/24/2022   CREATININE 1.74 (H) 10/24/2022   CALCIUM 9.2 10/24/2022   GLUCOSE 96 10/24/2022  Rechecking labs today Patient encouraged to drink at least 64 ounces of water daily to maintain hydration Avoid NSAIDs and other nephrotoxic agents. Plans to start patient on Farxiga for kidney protection Follow-up with nephrology

## 2023-04-24 NOTE — Assessment & Plan Note (Addendum)
Last Weight  Most recent update: 04/24/2023 10:13 AM    Weight  117.8 kg (259 lb 9.6 oz)             Wt Readings from Last 3 Encounters:  04/24/23 259 lb 9.6 oz (117.8 kg)  03/13/23 264 lb (119.7 kg)  12/19/22 266 lb (120.7 kg)  Body mass index is 39.47 kg/m.  Patient has lost about 7 pounds in 6 months Patient counseled on low-carb modified diet Encouraged to engage in regular moderate to vigorous exercise with at least 150 minutes weekly Starting phentermine 15 mg daily side effects of phentermine including palpitations hypertension discussed. I discussed with the patient that phentermine is indicated for short-term only Follow-up in 4 weeks will increase to phentermine 37.5 mg daily if medication is well-tolerated . referral to medical weight management clinic placed

## 2023-04-24 NOTE — Patient Instructions (Addendum)
1. Class 3 severe obesity due to excess calories with body mass index (BMI) of 40.0 to 44.9 in adult, unspecified whether serious comorbidity present (HCC)  - phentermine 15 MG capsule; Take 1 capsule (15 mg total) by mouth every morning.  Dispense: 30 capsule; Refill: 0  2. Elevated serum creatinine  - CMP14+EGFR  3. Mixed hyperlipidemia   It is important that you exercise regularly at least 30 minutes 5 times a week as tolerated  Think about what you will eat, plan ahead. Choose " clean, green, fresh or frozen" over canned, processed or packaged foods which are more sugary, salty and fatty. 70 to 75% of food eaten should be vegetables and fruit. Three meals at set times with snacks allowed between meals, but they must be fruit or vegetables. Aim to eat over a 12 hour period , example 7 am to 7 pm, and STOP after  your last meal of the day. Drink water,generally about 64 ounces per day, no other drink is as healthy. Fruit juice is best enjoyed in a healthy way, by EATING the fruit.  Thanks for choosing [facility] we consider it a privelige to serve you.

## 2023-04-24 NOTE — Progress Notes (Signed)
Established Patient Office Visit  Subjective:  Patient ID: Alex Mitchell, male    DOB: 1981/01/13  Age: 42 y.o. MRN: [id number]  CC:  Chief Complaint  Patient presents with   Weight Loss    HPI Alex Mitchell is a 42 y.o. male  has a past medical history of AIDS (acquired immune deficiency syndrome) (HCC) (10/31/2018), [place], Elevated serum creatinine, Hyperlipidemia, Mouth sores, Obesity, and Prediabetes.  Patient presents for follow-up for obesity  Obesity.  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  He had started eating more salads fruits since the past 30 days.  He is interested in losing weight   Elevated creatinine level.  Patient was referred to nephrology but he has not followed up with them.  Phone number to [facility] given in the office today.  Patient denies any adverse reactions to current medications.  Past Medical History:  Diagnosis Date   AIDS (acquired immune deficiency syndrome) (HCC) 10/31/2018   [place]    Elevated serum creatinine    Hyperlipidemia    Mouth sores    Obesity    Prediabetes     History reviewed. No pertinent surgical history.  Family History  Problem Relation Age of Onset   Hypertension Father    Hypertension Other     Social History   Socioeconomic History   Marital status: Single    Spouse name: Not on file   Number of children: Not on file   Years of education: Not on file   Highest education level: Not on file  Occupational History   Not on file  Tobacco Use   Smoking status: Never   Smokeless tobacco: Never  Vaping Use   Vaping Use: Never used  Substance and Sexual Activity   Alcohol use: Not Currently   Drug use: No   Sexual activity: Not Currently  Other Topics Concern   Not on file  Social History Narrative   Lives with his room mate    Social Determinants of Health   Financial Resource Strain: Not on file  Food Insecurity:  Not on file  Transportation Needs: Not on file  Physical Activity: Not on file  Stress: Not on file  Social Connections: Not on file  Intimate Partner Violence: Not on file    Outpatient Medications Prior to Visit  Medication Sig Dispense Refill   dolutegravir-lamiVUDine (DOVATO) 50-300 MG tablet TAKE 1 TABLET BY MOUTH DAILY 30 tablet 2   albuterol (VENTOLIN HFA) 108 (90 Base) MCG/ACT inhaler Inhale 1-2 puffs into the lungs every 6 (six) hours as needed. (Patient not taking: Reported on 04/24/2023) 8 g 0   cetirizine (ZYRTEC) 10 MG tablet Take 1 tablet (10 mg total) by mouth daily. (Patient not taking: Reported on 04/24/2023) 30 tablet 11   Cranberry 250 MG CHEW Chew by mouth. (Patient not taking: Reported on 04/24/2023)     ferrous sulfate 325 (65 FE) MG tablet Take 325 mg by mouth daily with breakfast. (Patient not taking: Reported on 04/24/2023)     fluticasone (FLONASE) 50 MCG/ACT nasal spray Place 2 sprays into both nostrils daily. (Patient not taking: Reported on 04/24/2023) 16 g 0   Pitavastatin Calcium 4 MG TABS Take 1 tablet (4 mg total) by mouth daily. (Patient not taking: Reported on 04/24/2023) 30 tablet 11   amoxicillin-clavulanate (AUGMENTIN) 875-125 MG tablet Take 1 tablet by mouth 2 (two) times daily. (Patient not taking:  Reported on 04/24/2023) 14 tablet 0   benzonatate (TESSALON) 100 MG capsule Take 1 capsule (100 mg total) by mouth 2 (two) times daily as needed for cough. (Patient not taking: Reported on 04/24/2023) 20 capsule 0   No facility-administered medications prior to visit.    No Known Allergies  ROS Review of Systems  Constitutional:  Negative for activity change, appetite change, chills, diaphoresis, fatigue, fever and unexpected weight change.  HENT:  Negative for congestion, dental problem, drooling and ear discharge.   Eyes:  Negative for pain, discharge, redness and itching.  Respiratory:  Negative for apnea, cough, choking, chest tightness, shortness of breath and  wheezing.   Cardiovascular: Negative.  Negative for chest pain, palpitations and leg swelling.  Gastrointestinal:  Negative for abdominal distention, abdominal pain, anal bleeding, blood in stool, constipation, diarrhea and vomiting.  Endocrine: Negative for polydipsia, polyphagia and polyuria.  Genitourinary:  Negative for difficulty urinating, flank pain, frequency and genital sores.  Musculoskeletal: Negative.  Negative for arthralgias, back pain, gait problem and joint swelling.  Skin:  Negative for color change, pallor and rash.  Neurological:  Negative for dizziness, facial asymmetry, light-headedness, numbness and headaches.  Psychiatric/Behavioral:  Negative for agitation, behavioral problems, confusion, hallucinations, self-injury, sleep disturbance and suicidal ideas.       Objective:    Physical Exam Vitals and nursing note reviewed.  Constitutional:      General: He is not in acute distress.    Appearance: Normal appearance. He is obese. He is not ill-appearing, toxic-appearing or diaphoretic.  HENT:     Mouth/Throat:     Mouth: Mucous membranes are moist.     Pharynx: Oropharynx is clear. No oropharyngeal exudate or posterior oropharyngeal erythema.  Eyes:     General: No scleral icterus.       Right eye: No discharge.        Left eye: No discharge.     Extraocular Movements: Extraocular movements intact.     Conjunctiva/sclera: Conjunctivae normal.  Cardiovascular:     Rate and Rhythm: Normal rate and regular rhythm.     Pulses: Normal pulses.     Heart sounds: Normal heart sounds. No murmur heard.    No friction rub. No gallop.  Pulmonary:     Effort: Pulmonary effort is normal. No respiratory distress.     Breath sounds: Normal breath sounds. No stridor. No wheezing, rhonchi or rales.  Chest:     Chest wall: No tenderness.  Abdominal:     General: There is no distension.     Palpations: Abdomen is soft.     Tenderness: There is no abdominal tenderness. There  is no right CVA tenderness, left CVA tenderness or guarding.  Musculoskeletal:        General: No swelling, tenderness, deformity or signs of injury.     Right lower leg: No edema.     Left lower leg: No edema.  Skin:    General: Skin is warm and dry.     Capillary Refill: Capillary refill takes less than 2 seconds.     Coloration: Skin is not jaundiced or pale.     Findings: No bruising, erythema or lesion.  Neurological:     Mental Status: He is alert and oriented to person, place, and time.     Motor: No weakness.     Coordination: Coordination normal.     Gait: Gait normal.  Psychiatric:        Mood and Affect: Mood normal.  Behavior: Behavior normal.        Thought Content: Thought content normal.        Judgment: Judgment normal.     BP 119/64   Pulse 60   Temp 97.8 F (36.6 C)   Ht 5\' 8"  (1.727 m)   Wt 259 lb 9.6 oz (117.8 kg)   SpO2 100%   BMI 39.47 kg/m  Wt Readings from Last 3 Encounters:  04/24/23 259 lb 9.6 oz (117.8 kg)  03/13/23 264 lb (119.7 kg)  12/19/22 266 lb (120.7 kg)    Lab Results  Component Value Date   TSH 1.540 09/05/2022   Lab Results  Component Value Date   WBC 7.1 03/07/2022   HGB 13.0 03/07/2022   HCT 38.8 03/07/2022   MCV 95 03/07/2022   PLT 214 03/07/2022   Lab Results  Component Value Date   NA 138 10/24/2022   K 4.3 10/24/2022   CO2 26 10/24/2022   GLUCOSE 96 10/24/2022   BUN 17 10/24/2022   CREATININE 1.74 (H) 10/24/2022   BILITOT 0.6 10/24/2022   ALKPHOS 132 (H) 10/21/2018   AST 18 10/24/2022   ALT 11 10/24/2022   PROT 7.3 10/24/2022   ALBUMIN 2.6 (L) 10/21/2018   CALCIUM 9.2 10/24/2022   ANIONGAP 11 10/29/2018   EGFR 48 (L) 08/24/2021   Lab Results  Component Value Date   CHOL 217 (H) 10/24/2022   Lab Results  Component Value Date   HDL 37 (L) 10/24/2022   Lab Results  Component Value Date   LDLCALC 158 (H) 10/24/2022   Lab Results  Component Value Date   TRIG 103 10/24/2022   Lab Results   Component Value Date   CHOLHDL 5.9 (H) 10/24/2022   Lab Results  Component Value Date   HGBA1C 5.8 (H) 10/24/2022      Assessment & Plan:   Problem List Items Addressed This Visit       Other   Elevated serum creatinine    Lab Results  Component Value Date   NA 138 10/24/2022   K 4.3 10/24/2022   CO2 26 10/24/2022   BUN 17 10/24/2022   CREATININE 1.74 (H) 10/24/2022   CALCIUM 9.2 10/24/2022   GLUCOSE 96 10/24/2022  Rechecking labs today Patient encouraged to drink at least 64 ounces of water daily to maintain hydration Avoid NSAIDs and other nephrotoxic agents. Plans to start patient on Farxiga for kidney protection Follow-up with nephrology      Relevant Orders   CMP14+EGFR   Obesity - Primary    Last Weight  Most recent update: 04/24/2023 10:13 AM    Weight  117.8 kg (259 lb 9.6 oz)             Wt Readings from Last 3 Encounters:  04/24/23 259 lb 9.6 oz (117.8 kg)  03/13/23 264 lb (119.7 kg)  12/19/22 266 lb (120.7 kg)  Body mass index is 39.47 kg/m.  Patient has lost about 7 pounds in 6 months Patient counseled on low-carb modified diet Encouraged to engage in regular moderate to vigorous exercise with at least 150 minutes weekly Starting phentermine 15 mg daily side effects of phentermine including palpitations hypertension discussed. I discussed with the patient that phentermine is indicated for short-term only Follow-up in 4 weeks will increase to phentermine 37.5 mg daily if medication is well-tolerated . referral to medical weight management clinic placed      Relevant Medications   phentermine 15 MG capsule   Other Relevant  Orders   Amb Ref to Medical Weight Management   Hyperlipidemia    Lab Results  Component Value Date   CHOL 217 (H) 10/24/2022   HDL 37 (L) 10/24/2022   LDLCALC 158 (H) 10/24/2022   TRIG 103 10/24/2022   CHOLHDL 5.9 (H) 10/24/2022  He has stopped taking pitavastatin  Patient encouraged to restart medication         Meds ordered this encounter  Medications   phentermine 15 MG capsule    Sig: Take 1 capsule (15 mg total) by mouth every morning.    Dispense:  30 capsule    Refill:  0    Follow-up: Return in about 4 weeks (around 05/22/2023) for obesity.    Donell Beers, FNP

## 2023-04-24 NOTE — Assessment & Plan Note (Signed)
Lab Results  Component Value Date   CHOL 217 (H) 10/24/2022   HDL 37 (L) 10/24/2022   LDLCALC 158 (H) 10/24/2022   TRIG 103 10/24/2022   CHOLHDL 5.9 (H) 10/24/2022  He has stopped taking pitavastatin  Patient encouraged to restart medication

## 2023-04-25 LAB — CMP14+EGFR
ALT: 13 IU/L (ref 0–44)
AST: 23 IU/L (ref 0–40)
Albumin/Globulin Ratio: 1.8 (ref 1.2–2.2)
Albumin: 4.8 g/dL (ref 4.1–5.1)
Alkaline Phosphatase: 54 IU/L (ref 44–121)
BUN/Creatinine Ratio: 13 (ref 9–20)
BUN: 22 mg/dL (ref 6–24)
Bilirubin Total: 0.5 mg/dL (ref 0.0–1.2)
CO2: 22 mmol/L (ref 20–29)
Calcium: 9.9 mg/dL (ref 8.7–10.2)
Chloride: 99 mmol/L (ref 96–106)
Creatinine, Ser: 1.74 mg/dL — ABNORMAL HIGH (ref 0.76–1.27)
Globulin, Total: 2.7 g/dL (ref 1.5–4.5)
Glucose: 86 mg/dL (ref 70–99)
Potassium: 4.4 mmol/L (ref 3.5–5.2)
Sodium: 136 mmol/L (ref 134–144)
Total Protein: 7.5 g/dL (ref 6.0–8.5)
eGFR: 50 mL/min/{1.73_m2} — ABNORMAL LOW (ref >59)

## 2023-04-26 ENCOUNTER — Telehealth: Payer: Self-pay

## 2023-04-26 ENCOUNTER — Other Ambulatory Visit: Payer: Self-pay | Admitting: Nurse Practitioner

## 2023-04-26 DIAGNOSIS — N1831 Chronic kidney disease, stage 3a: Secondary | ICD-10-CM

## 2023-04-26 MED ORDER — DAPAGLIFLOZIN PROPANEDIOL 10 MG PO TABS
10.0000 mg | ORAL_TABLET | Freq: Every day | ORAL | 3 refills | Status: DC
Start: 2023-04-26 — End: 2023-05-16

## 2023-04-26 NOTE — Telephone Encounter (Signed)
Pt advised he needs a new referral for nephrology placed. Please advise when done or if I can place the order.

## 2023-04-30 ENCOUNTER — Other Ambulatory Visit: Payer: Self-pay | Admitting: Infectious Diseases

## 2023-04-30 ENCOUNTER — Other Ambulatory Visit: Payer: Self-pay

## 2023-04-30 DIAGNOSIS — Z113 Encounter for screening for infections with a predominantly sexual mode of transmission: Secondary | ICD-10-CM

## 2023-04-30 DIAGNOSIS — Z21 Asymptomatic human immunodeficiency virus [HIV] infection status: Secondary | ICD-10-CM

## 2023-04-30 DIAGNOSIS — B20 Human immunodeficiency virus [HIV] disease: Secondary | ICD-10-CM

## 2023-05-01 ENCOUNTER — Other Ambulatory Visit (HOSPITAL_COMMUNITY)
Admission: RE | Admit: 2023-05-01 | Discharge: 2023-05-01 | Disposition: A | Discharge status: Home / Self Care | Payer: Medicaid Other | Source: Ambulatory Visit | Attending: Infectious Diseases | Admitting: Infectious Diseases

## 2023-05-01 ENCOUNTER — Other Ambulatory Visit: Payer: Self-pay

## 2023-05-01 ENCOUNTER — Other Ambulatory Visit: Payer: Medicaid Other

## 2023-05-01 DIAGNOSIS — B20 Human immunodeficiency virus [HIV] disease: Secondary | ICD-10-CM | POA: Diagnosis not present

## 2023-05-01 DIAGNOSIS — N1831 Chronic kidney disease, stage 3a: Secondary | ICD-10-CM

## 2023-05-01 DIAGNOSIS — Z113 Encounter for screening for infections with a predominantly sexual mode of transmission: Secondary | ICD-10-CM | POA: Insufficient documentation

## 2023-05-01 LAB — CBC WITH DIFFERENTIAL/PLATELET
Basophils Absolute: 19 cells/uL (ref 0–200)
HCT: 37.1 % — ABNORMAL LOW (ref 38.5–50.0)
MPV: 11.6 fL (ref 7.5–12.5)
Neutro Abs: 2480 cells/uL (ref 1500–7800)
Neutrophils Relative %: 40 %

## 2023-05-02 LAB — LIPID PANEL
HDL: 49 mg/dL (ref >=40)
LDL Cholesterol (Calc): 161 mg/dL (calc) — ABNORMAL HIGH
Total CHOL/HDL Ratio: 4.5 (calc) (ref <5.0)
Triglycerides: 39 mg/dL (ref <150)

## 2023-05-02 LAB — COMPLETE METABOLIC PANEL WITH GFR
AG Ratio: 1.7 (calc) (ref 1.0–2.5)
ALT: 11 U/L (ref 9–46)
AST: 20 U/L (ref 10–40)
Alkaline phosphatase (APISO): 45 U/L (ref 36–130)
BUN/Creatinine Ratio: 10 (calc) (ref 6–22)
Glucose, Bld: 99 mg/dL (ref 65–99)

## 2023-05-02 LAB — URINE CYTOLOGY ANCILLARY ONLY
Chlamydia: NEGATIVE
Comment: NEGATIVE
Comment: NORMAL
Neisseria Gonorrhea: NEGATIVE

## 2023-05-02 NOTE — Progress Notes (Signed)
Would like for him to see nephrology to help with work up of his CKD3a.  We have specifically kept him off TAF regimens due to this factor.   Message sent to patient explaining referral.

## 2023-05-03 LAB — HELPER T-LYMPH-CD4 (ARMC ONLY)
% CD 4 Pos. Lymph.: 14.4 % — ABNORMAL LOW (ref 30.8–58.5)
Absolute CD 4 Helper: 374 /uL (ref 359–1519)
Basophils Absolute: 0 10*3/uL (ref 0.0–0.2)
Basos: 1 %
EOS (ABSOLUTE): 0.3 10*3/uL (ref 0.0–0.4)
Eos: 6 %
Hematocrit: 38.2 % (ref 37.5–51.0)
Hemoglobin: 12.7 g/dL — ABNORMAL LOW (ref 13.0–17.7)
Immature Grans (Abs): 0 10*3/uL (ref 0.0–0.1)
Immature Granulocytes: 0 %
Lymphocytes Absolute: 2.6 10*3/uL (ref 0.7–3.1)
Lymphs: 42 %
MCH: 32.2 pg (ref 26.6–33.0)
MCHC: 33.2 g/dL (ref 31.5–35.7)
MCV: 97 fL (ref 79–97)
Monocytes Absolute: 0.6 10*3/uL (ref 0.1–0.9)
Monocytes: 9 %
Neutrophils Absolute: 2.6 10*3/uL (ref 1.4–7.0)
Neutrophils: 42 %
Platelets: 211 10*3/uL (ref 150–450)
RBC: 3.94 x10E6/uL — ABNORMAL LOW (ref 4.14–5.80)
RDW: 13.3 % (ref 11.6–15.4)
WBC: 6.1 10*3/uL (ref 3.4–10.8)

## 2023-05-03 LAB — RPR: RPR Ser Ql: NONREACTIVE

## 2023-05-03 LAB — CBC WITH DIFFERENTIAL/PLATELET
Absolute Monocytes: 639 cells/uL (ref 200–950)
Basophils Relative: 0.3 %
Eosinophils Absolute: 360 cells/uL (ref 15–500)
Eosinophils Relative: 5.8 %
Hemoglobin: 12.6 g/dL — ABNORMAL LOW (ref 13.2–17.1)
Lymphs Abs: 2703 cells/uL (ref 850–3900)
MCH: 32.5 pg (ref 27.0–33.0)
MCHC: 34 g/dL (ref 32.0–36.0)
MCV: 95.6 fL (ref 80.0–100.0)
Monocytes Relative: 10.3 %
Platelets: 219 10*3/uL (ref 140–400)
RBC: 3.88 10*6/uL — ABNORMAL LOW (ref 4.20–5.80)
RDW: 13.4 % (ref 11.0–15.0)
Total Lymphocyte: 43.6 %
WBC: 6.2 10*3/uL (ref 3.8–10.8)

## 2023-05-03 LAB — COMPLETE METABOLIC PANEL WITH GFR
Albumin: 4.7 g/dL (ref 3.6–5.1)
BUN: 20 mg/dL (ref 7–25)
CO2: 24 mmol/L (ref 20–32)
Calcium: 9.5 mg/dL (ref 8.6–10.3)
Chloride: 104 mmol/L (ref 98–110)
Creat: 1.91 mg/dL — ABNORMAL HIGH (ref 0.60–1.29)
Globulin: 2.7 g/dL (calc) (ref 1.9–3.7)
Potassium: 3.9 mmol/L (ref 3.5–5.3)
Sodium: 136 mmol/L (ref 135–146)
Total Bilirubin: 0.6 mg/dL (ref 0.2–1.2)
Total Protein: 7.4 g/dL (ref 6.1–8.1)
eGFR: 45 mL/min/{1.73_m2} — ABNORMAL LOW (ref >=60)

## 2023-05-03 LAB — LIPID PANEL
Cholesterol: 222 mg/dL — ABNORMAL HIGH (ref <200)
Non-HDL Cholesterol (Calc): 173 mg/dL (calc) — ABNORMAL HIGH (ref <130)

## 2023-05-03 LAB — HIV-1 RNA QUANT-NO REFLEX-BLD
HIV 1 RNA Quant: <20 Copies/mL — ABNORMAL HIGH
HIV-1 RNA Quant, Log: <1.3 Log cps/mL — ABNORMAL HIGH

## 2023-05-09 ENCOUNTER — Other Ambulatory Visit: Payer: Self-pay | Admitting: Infectious Diseases

## 2023-05-09 ENCOUNTER — Other Ambulatory Visit: Payer: Self-pay

## 2023-05-09 ENCOUNTER — Telehealth: Payer: Self-pay

## 2023-05-09 DIAGNOSIS — Z21 Asymptomatic human immunodeficiency virus [HIV] infection status: Secondary | ICD-10-CM

## 2023-05-09 NOTE — Telephone Encounter (Signed)
Pt said that the med he's taking for kidney;s can be changed? He said he don't need the generic brand. He need the name brand . Medicaid wont take the generic brand.

## 2023-05-09 NOTE — Telephone Encounter (Signed)
A prior authorization request for Alex Mitchell was submitted to patient's [facility] and Medicaid via CoverMyMeds: KEY: [id number] (OSCAR) KEY: [id number] (MEDICAID)

## 2023-05-10 ENCOUNTER — Other Ambulatory Visit: Payer: Self-pay

## 2023-05-13 ENCOUNTER — Other Ambulatory Visit: Payer: Self-pay

## 2023-05-13 NOTE — Telephone Encounter (Signed)
Approved until 05/10/2024. [facility] has been notified and were able to process the prescription through ins. MyChart message sent to patient today.

## 2023-05-15 NOTE — Progress Notes (Signed)
Name: Alex Mitchell  DOB: 1981/07/19 MRN: [id number] PCP: Donell Beers, FNP   VIRTUAL CARE ENCOUNTER  I connected with Alex Mitchell on 05/16/23 at  9:30 AM EDT by video and verified that I am speaking with the correct person using two identifiers.   I discussed the limitations, risks, security and privacy concerns of performing an evaluation and management service by telephone and the availability of in person appointments. I also discussed with the patient that there may be a patient responsible charge related to this service. The patient expressed understanding and agreed to proceed.  Patient Location: [facility]   Other Participants:   Provider Location: [facility] Office    Brief Narrative:  Alex Mitchell is a 42 y.o. 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  History of OIs: PJP pna, esophageal candidiasis   Previous Regimens: Biktarvy 09-2018  Dovato    Genotypes: Not performed in hospital    Subjective:   Chief Complaint  Patient presents with   Follow-up    b20      HPI:  Alex Mitchell Stable joins me on the video chat today for review of lab work and HIV follow-up care.  He is doing very well.  He is working with primary care and has started phentermine for weight loss.  Over the last month he has dropped to pant sizes.  He has worked hard to make changes to his diet as well to incorporate more whole foods and has been making more attempts to exercise.  His knees are better - both are affected but left > right.  Back to see PCP July 3rd - started on phentermine for weight loss. Lost 2 pant sizes over the last month.   Continues taking his Dovato once a day without any concerns for access or side effects.  He was recommended to start on a medication called Marcelline Deist and has had a hard time getting this from the pharmacy.  His primary care doctor also recommended referral to nephrology for evaluation.      Review of Systems  Musculoskeletal:  Positive for gait problem and joint swelling.  All other systems reviewed and are negative.      Outpatient Medications Prior to Visit  Medication Sig Dispense Refill   albuterol (VENTOLIN HFA) 108 (90 Base) MCG/ACT inhaler Inhale 1-2 puffs into the lungs every 6 (six) hours as needed. (Patient not taking: Reported on 04/24/2023) 8 g 0   cetirizine (ZYRTEC) 10 MG tablet Take 1 tablet (10 mg total) by mouth daily. (Patient not taking: Reported on 04/24/2023) 30 tablet 11   Cranberry 250 MG CHEW Chew by mouth. (Patient not taking: Reported on 04/24/2023)     dolutegravir-lamiVUDine (DOVATO) 50-300 MG tablet TAKE 1 TABLET BY MOUTH DAILY 30 tablet 0   ferrous sulfate 325 (65 FE) MG tablet Take 325 mg by mouth daily with breakfast. (Patient not taking: Reported on 04/24/2023)     fluticasone (FLONASE) 50 MCG/ACT nasal spray Place 2 sprays into both nostrils daily. (Patient not taking: Reported on 04/24/2023) 16 g 0   phentermine 15 MG capsule Take 1 capsule (15 mg total) by mouth every morning. 30 capsule 0   Pitavastatin Calcium 4 MG TABS Take 1 tablet (4 mg total) by mouth daily. (Patient not taking: Reported on 04/24/2023) 30 tablet 11   dapagliflozin propanediol (FARXIGA) 10 MG TABS tablet Take 1 tablet (10 mg total) by mouth daily before breakfast. 30  tablet 3   No facility-administered medications prior to visit.     No Known Allergies  Social History   Tobacco Use   Smoking status: Never   Smokeless tobacco: Never  Vaping Use   Vaping Use: Never used  Substance Use Topics   Alcohol use: Not Currently   Drug use: No    Social History   Substance and Sexual Activity  Sexual Activity Not Currently     Objective:   There were no vitals filed for this visit.   There is no height or weight on file to calculate BMI.   Physical Exam Constitutional:      Appearance: Normal appearance. He is not ill-appearing.  HENT:     Head:  Normocephalic.     Mouth/Throat:     Mouth: Mucous membranes are moist.     Pharynx: Oropharynx is clear.  Eyes:     General: No scleral icterus. Pulmonary:     Effort: Pulmonary effort is normal.  Musculoskeletal:        General: Normal range of motion.     Cervical back: Normal range of motion.  Skin:    Coloration: Skin is not jaundiced or pale.  Neurological:     Mental Status: He is alert and oriented to person, place, and time.  Psychiatric:        Mood and Affect: Mood normal.        Judgment: Judgment normal.      Lab Results Lab Results  Component Value Date   WBC 6.2 05/01/2023   WBC 6.1 05/01/2023   HGB 12.6 (L) 05/01/2023   HGB 12.7 (L) 05/01/2023   HCT 37.1 (L) 05/01/2023   HCT 38.2 05/01/2023   MCV 95.6 05/01/2023   MCV 97 05/01/2023   PLT 219 05/01/2023   PLT 211 05/01/2023    Lab Results  Component Value Date   CREATININE 1.91 (H) 05/01/2023   BUN 20 05/01/2023   NA 136 05/01/2023   K 3.9 05/01/2023   CL 104 05/01/2023   CO2 24 05/01/2023    Lab Results  Component Value Date   ALT 11 05/01/2023   AST 20 05/01/2023   ALKPHOS 54 04/24/2023   BILITOT 0.6 05/01/2023    Lab Results  Component Value Date   CHOL 222 (H) 05/01/2023   HDL 49 05/01/2023   LDLCALC 161 (H) 05/01/2023   TRIG 39 05/01/2023   CHOLHDL 4.5 05/01/2023   HIV 1 RNA Quant (Copies/mL)  Date Value  05/01/2023 <20 (H)  10/24/2022 Not Detected  05/24/2022 Not Detected   CD4 T Cell Abs (/uL)  Date Value  10/24/2022 424  08/24/2021 442  02/08/2021 322 (L)    Assessment & Plan:   Problem List Items Addressed This Visit       Unprioritized   Elevated serum creatinine    I will reach out to the pharmacy over the patient care center to see if they can help with barriers to obtaining the [place]. Agree with nephrology input.      Healthcare maintenance - Primary    We discussed results of the reprieve trial.  He is doing well on up atorvastatin 4 mg daily.  Will  continue this for primary prevention against cardiovascular disease for PLWH. Discussed anorectal cancer screening recommendations.  He would like to proceed with collection at next office visit in 6 months.  We can allow collection technique if preferred.      HIV disease (HCC) (Chronic)  Virologically controlled on Dovato with viral loads less than 20.  Immunologic recovery has been wonderful with CD4 generally close to or above 400. Specifically have kept him off of tenofovir due to elevated kidney labs. Anorectal cancer screening to be collected at next office visit.  Flu/COVID recommendations will be provided at that time as well but encouraged to get around September or October. No sexual health concerns.  No mental health or behavioral concerns. We scheduled the next appointment at the time of our video call      Obesity    Sounds like he is doing well on phentermine.  We spent some time discussing his nutrition and exercise changes as well.  I congratulated him and encouraged him to continue this good work for his health long-term      Other Visit Diagnoses     Stage 3a chronic kidney disease (HCC)           Rexene Alberts, MSN, NP-C [facility]  [email or web address] Pager: [phone] Office: [phone] [facility] Main Line: [phone]

## 2023-05-16 ENCOUNTER — Telehealth (INDEPENDENT_AMBULATORY_CARE_PROVIDER_SITE_OTHER): Payer: 59 | Admitting: Infectious Diseases

## 2023-05-16 ENCOUNTER — Other Ambulatory Visit: Payer: Self-pay

## 2023-05-16 DIAGNOSIS — R7989 Other specified abnormal findings of blood chemistry: Secondary | ICD-10-CM

## 2023-05-16 DIAGNOSIS — Z6841 Body Mass Index (BMI) 40.0 and over, adult: Secondary | ICD-10-CM | POA: Diagnosis not present

## 2023-05-16 DIAGNOSIS — N1831 Chronic kidney disease, stage 3a: Secondary | ICD-10-CM | POA: Diagnosis not present

## 2023-05-16 DIAGNOSIS — B20 Human immunodeficiency virus [HIV] disease: Secondary | ICD-10-CM | POA: Diagnosis not present

## 2023-05-16 DIAGNOSIS — Z0000 Encounter for general adult medical examination without abnormal findings: Secondary | ICD-10-CM

## 2023-05-16 MED ORDER — DAPAGLIFLOZIN PROPANEDIOL 10 MG PO TABS
10.0000 mg | ORAL_TABLET | Freq: Every day | ORAL | 3 refills | Status: DC
Start: 2023-05-16 — End: 2023-05-22

## 2023-05-16 NOTE — Assessment & Plan Note (Signed)
I will reach out to the pharmacy over the patient care center to see if they can help with barriers to obtaining the [place]. Agree with nephrology input.

## 2023-05-16 NOTE — Assessment & Plan Note (Signed)
Virologically controlled on Dovato with viral loads less than 20.  Immunologic recovery has been wonderful with CD4 generally close to or above 400. Specifically have kept him off of tenofovir due to elevated kidney labs. Anorectal cancer screening to be collected at next office visit.  Flu/COVID recommendations will be provided at that time as well but encouraged to get around September or October. No sexual health concerns.  No mental health or behavioral concerns. We scheduled the next appointment at the time of our video call

## 2023-05-16 NOTE — Assessment & Plan Note (Signed)
We discussed results of the reprieve trial.  He is doing well on up atorvastatin 4 mg daily.  Will continue this for primary prevention against cardiovascular disease for PLWH. Discussed anorectal cancer screening recommendations.  He would like to proceed with collection at next office visit in 6 months.  We can allow collection technique if preferred.

## 2023-05-16 NOTE — Telephone Encounter (Signed)
Can we please re-send [place] script to [facility]? His insurance is not contracted with [facility].   Thanks!

## 2023-05-16 NOTE — Assessment & Plan Note (Signed)
Sounds like he is doing well on phentermine.  We spent some time discussing his nutrition and exercise changes as well.  I congratulated him and encouraged him to continue this good work for his health long-term

## 2023-05-22 ENCOUNTER — Ambulatory Visit (INDEPENDENT_AMBULATORY_CARE_PROVIDER_SITE_OTHER): Payer: 59 | Admitting: Nurse Practitioner

## 2023-05-22 ENCOUNTER — Encounter: Payer: Self-pay | Admitting: Nurse Practitioner

## 2023-05-22 VITALS — BP 115/68 | HR 73 | Temp 97.0°F | Ht 68.0 in | Wt 253.8 lb

## 2023-05-22 DIAGNOSIS — E785 Hyperlipidemia, unspecified: Secondary | ICD-10-CM

## 2023-05-22 DIAGNOSIS — N1831 Chronic kidney disease, stage 3a: Secondary | ICD-10-CM

## 2023-05-22 DIAGNOSIS — Z6838 Body mass index (BMI) 38.0-38.9, adult: Secondary | ICD-10-CM

## 2023-05-22 DIAGNOSIS — E66812 Obesity, class 2: Secondary | ICD-10-CM

## 2023-05-22 HISTORY — DX: Chronic kidney disease, stage 3a: N18.31

## 2023-05-22 MED ORDER — DAPAGLIFLOZIN PROPANEDIOL 10 MG PO TABS
10.0000 mg | ORAL_TABLET | Freq: Every day | ORAL | 3 refills | Status: DC
Start: 2023-05-22 — End: 2023-07-26

## 2023-05-22 MED ORDER — PHENTERMINE HCL 37.5 MG PO CAPS
37.5000 mg | ORAL_CAPSULE | ORAL | 2 refills | Status: DC
Start: 2023-05-25 — End: 2023-08-28

## 2023-05-22 NOTE — Assessment & Plan Note (Signed)
Lab Results  Component Value Date   CHOL 222 (H) 05/01/2023   HDL 49 05/01/2023   LDLCALC 161 (H) 05/01/2023   TRIG 39 05/01/2023   CHOLHDL 4.5 05/01/2023  He has started taking pitavastatin 4 mg daily Continue current medication Avoid fatty fried foods Will recheck labs at next visit

## 2023-05-22 NOTE — Patient Instructions (Signed)
Please come fasting to your next appointment   1. Stage 3a chronic kidney disease (HCC)  - dapagliflozin propanediol (FARXIGA) 10 MG TABS tablet; Take 1 tablet (10 mg total) by mouth daily before breakfast.  Dispense: 30 tablet; Refill: 3  2. Hyperlipidemia, unspecified hyperlipidemia type   3. Class 2 severe obesity due to excess calories with serious comorbidity and body mass index (BMI) of 38.0 to 38.9 in adult (HCC)  - phentermine 37.5 MG capsule; Take 1 capsule (37.5 mg total) by mouth every morning.  Dispense: 30 capsule; Refill: 2    It is important that you exercise regularly at least 30 minutes 5 times a week as tolerated  Think about what you will eat, plan ahead. Choose " clean, green, fresh or frozen" over canned, processed or packaged foods which are more sugary, salty and fatty. 70 to 75% of food eaten should be vegetables and fruit. Three meals at set times with snacks allowed between meals, but they must be fruit or vegetables. Aim to eat over a 12 hour period , example 7 am to 7 pm, and STOP after  your last meal of the day. Drink water,generally about 64 ounces per day, no other drink is as healthy. Fruit juice is best enjoyed in a healthy way, by EATING the fruit.  [facility] for choosing [facility] we consider it a privelige to serve you.

## 2023-05-22 NOTE — Assessment & Plan Note (Signed)
Lab Results  Component Value Date   NA 136 05/01/2023   K 3.9 05/01/2023   CO2 24 05/01/2023   GLUCOSE 99 05/01/2023   BUN 20 05/01/2023   CREATININE 1.91 (H) 05/01/2023   CALCIUM 9.5 05/01/2023   EGFR 45 (L) 05/01/2023   GFRNONAA 48 (L) 12/07/2019  Doing well on Farxiga 10 mg daily Continue current medication Avoid NSAIDs and other nephrotoxic agents Follow-up with nephrologist

## 2023-05-22 NOTE — Assessment & Plan Note (Addendum)
Wt Readings from Last 3 Encounters:  05/22/23 253 lb 12.8 oz (115.1 kg)  04/24/23 259 lb 9.6 oz (117.8 kg)  03/13/23 264 lb (119.7 kg)   Body mass index is 38.59 kg/m.  He has lost about 6 pounds since last visit Patient congratulated on his efforts at losing weight Start phentermine 37.5 mg daily He was counseled on low-carb modified diet Encouraged to engage in regular moderate to vigorous exercises at least 150 minutes weekly Follow-up in 3 months

## 2023-05-22 NOTE — Progress Notes (Signed)
Established Patient Office Visit  Subjective:  Patient ID: Alex Mitchell, male    DOB: [birth year]  Age: 42 y.o. MRN: [id number]  CC:  Chief Complaint  Patient presents with   Follow-up    HPI Alex Mitchell is a 42 y.o. male  has a past medical history of AIDS (acquired immune deficiency syndrome) (HCC) (10/31/2018), [place], Elevated serum creatinine, Hyperlipidemia, Mouth sores, Obesity, and Prediabetes.   Patient presents for follow-up for obesity.   Obesity.  Currently on phentermine 15 mg daily. He  Goes the park for walking daily, has been drinking just water, eating more veggies, avoiding fried foods, blood pressure control.  Patient denies palpitations   CKD stage III.  Has started taking Farxiga 10 mg daily.  Referral to nephrology is pending.  Stated that he initially felt dizzy when he started [place] but that has resolved.  Patient denies any adverse reactions to current medications      Past Medical History:  Diagnosis Date   AIDS (acquired immune deficiency syndrome) (HCC) 10/31/2018   [place]    Elevated serum creatinine    Hyperlipidemia    Mouth sores    Obesity    Prediabetes     No past surgical history on file.  Family History  Problem Relation Age of Onset   Hypertension Father    Hypertension Other     Social History   Socioeconomic History   Marital status: Single    Spouse name: Not on file   Number of children: Not on file   Years of education: Not on file   Highest education level: Not on file  Occupational History   Not on file  Tobacco Use   Smoking status: Never   Smokeless tobacco: Never  Vaping Use   Vaping Use: Never used  Substance and Sexual Activity   Alcohol use: Not Currently   Drug use: No   Sexual activity: Not Currently  Other Topics Concern   Not on file  Social History Narrative   Lives with his room mate    Social Determinants of Health   Financial Resource Strain: Not on file  Food Insecurity: Not on  file  Transportation Needs: Not on file  Physical Activity: Not on file  Stress: Not on file  Social Connections: Not on file  Intimate Partner Violence: Not on file    Outpatient Medications Prior to Visit  Medication Sig Dispense Refill   dolutegravir-lamiVUDine (DOVATO) 50-300 MG tablet TAKE 1 TABLET BY MOUTH DAILY 30 tablet 0   Pitavastatin Calcium 4 MG TABS Take 1 tablet (4 mg total) by mouth daily. 30 tablet 11   dapagliflozin propanediol (FARXIGA) 10 MG TABS tablet Take 1 tablet (10 mg total) by mouth daily before breakfast. 30 tablet 3   phentermine 15 MG capsule Take 1 capsule (15 mg total) by mouth every morning. 30 capsule 0   albuterol (VENTOLIN HFA) 108 (90 Base) MCG/ACT inhaler Inhale 1-2 puffs into the lungs every 6 (six) hours as needed. (Patient not taking: Reported on 04/24/2023) 8 g 0   cetirizine (ZYRTEC) 10 MG tablet Take 1 tablet (10 mg total) by mouth daily. (Patient not taking: Reported on 04/24/2023) 30 tablet 11   Cranberry 250 MG CHEW Chew by mouth. (Patient not taking: Reported on 04/24/2023)     ferrous sulfate 325 (65 FE) MG tablet Take 325 mg by mouth daily with breakfast. (Patient not taking: Reported on 04/24/2023)     fluticasone (FLONASE) 50 MCG/ACT nasal spray Place  2 sprays into both nostrils daily. (Patient not taking: Reported on 04/24/2023) 16 g 0   No facility-administered medications prior to visit.    No Known Allergies  ROS Review of Systems  Constitutional:  Negative for activity change, appetite change, chills, diaphoresis, fatigue, fever and unexpected weight change.  HENT:  Negative for congestion, dental problem, drooling and ear discharge.   Eyes:  Negative for pain, discharge, redness and itching.  Respiratory:  Negative for apnea, cough, choking, chest tightness, shortness of breath and wheezing.   Cardiovascular: Negative.  Negative for chest pain, palpitations and leg swelling.  Gastrointestinal:  Negative for abdominal distention, abdominal  pain, anal bleeding, blood in stool, constipation, diarrhea and vomiting.  Endocrine: Negative for polydipsia, polyphagia and polyuria.  Genitourinary:  Negative for difficulty urinating, flank pain, frequency and genital sores.  Musculoskeletal: Negative.  Negative for arthralgias, back pain, gait problem and joint swelling.  Skin:  Negative for color change, pallor and rash.  Neurological:  Negative for dizziness, facial asymmetry, light-headedness, numbness and headaches.  Psychiatric/Behavioral:  Negative for agitation, behavioral problems, confusion, hallucinations, self-injury, sleep disturbance and suicidal ideas.       Objective:    Physical Exam Vitals and nursing note reviewed.  Constitutional:      General: He is not in acute distress.    Appearance: Normal appearance. He is obese. He is not ill-appearing, toxic-appearing or diaphoretic.  HENT:     Mouth/Throat:     Mouth: Mucous membranes are moist.     Pharynx: Oropharynx is clear. No oropharyngeal exudate or posterior oropharyngeal erythema.  Eyes:     General: No scleral icterus.       Right eye: No discharge.        Left eye: No discharge.     Extraocular Movements: Extraocular movements intact.     Conjunctiva/sclera: Conjunctivae normal.  Cardiovascular:     Rate and Rhythm: Normal rate and regular rhythm.     Pulses: Normal pulses.     Heart sounds: Normal heart sounds. No murmur heard.    No friction rub. No gallop.  Pulmonary:     Effort: Pulmonary effort is normal. No respiratory distress.     Breath sounds: Normal breath sounds. No stridor. No wheezing, rhonchi or rales.  Chest:     Chest wall: No tenderness.  Abdominal:     General: There is no distension.     Palpations: Abdomen is soft.     Tenderness: There is no abdominal tenderness. There is no right CVA tenderness, left CVA tenderness or guarding.  Musculoskeletal:        General: No swelling, tenderness, deformity or signs of injury.     Right  lower leg: No edema.     Left lower leg: No edema.  Skin:    General: Skin is warm and dry.     Capillary Refill: Capillary refill takes less than 2 seconds.     Coloration: Skin is not jaundiced or pale.     Findings: No bruising, erythema or lesion.  Neurological:     Mental Status: He is alert and oriented to person, place, and time.     Motor: No weakness.     Coordination: Coordination normal.     Gait: Gait normal.  Psychiatric:        Mood and Affect: Mood normal.        Behavior: Behavior normal.        Thought Content: Thought content normal.  Judgment: Judgment normal.     BP 115/68   Pulse 73   Temp (!) 97 F (36.1 C)   Ht 5\' 8"  (1.727 m)   Wt 253 lb 12.8 oz (115.1 kg)   SpO2 99%   BMI 38.59 kg/m  Wt Readings from Last 3 Encounters:  05/22/23 253 lb 12.8 oz (115.1 kg)  04/24/23 259 lb 9.6 oz (117.8 kg)  03/13/23 264 lb (119.7 kg)    Lab Results  Component Value Date   TSH 1.540 09/05/2022   Lab Results  Component Value Date   WBC 6.2 05/01/2023   WBC 6.1 05/01/2023   HGB 12.6 (L) 05/01/2023   HGB 12.7 (L) 05/01/2023   HCT 37.1 (L) 05/01/2023   HCT 38.2 05/01/2023   MCV 95.6 05/01/2023   MCV 97 05/01/2023   PLT 219 05/01/2023   PLT 211 05/01/2023   Lab Results  Component Value Date   NA 136 05/01/2023   K 3.9 05/01/2023   CO2 24 05/01/2023   GLUCOSE 99 05/01/2023   BUN 20 05/01/2023   CREATININE 1.91 (H) 05/01/2023   BILITOT 0.6 05/01/2023   ALKPHOS 54 04/24/2023   AST 20 05/01/2023   ALT 11 05/01/2023   PROT 7.4 05/01/2023   ALBUMIN 4.8 04/24/2023   CALCIUM 9.5 05/01/2023   ANIONGAP 11 10/29/2018   EGFR 45 (L) 05/01/2023   Lab Results  Component Value Date   CHOL 222 (H) 05/01/2023   Lab Results  Component Value Date   HDL 49 05/01/2023   Lab Results  Component Value Date   LDLCALC 161 (H) 05/01/2023   Lab Results  Component Value Date   TRIG 39 05/01/2023   Lab Results  Component Value Date   CHOLHDL 4.5  05/01/2023   Lab Results  Component Value Date   HGBA1C 5.8 (H) 10/24/2022      Assessment & Plan:   Problem List Items Addressed This Visit       Genitourinary   Stage 3a chronic kidney disease (HCC) - Primary    Lab Results  Component Value Date   NA 136 05/01/2023   K 3.9 05/01/2023   CO2 24 05/01/2023   GLUCOSE 99 05/01/2023   BUN 20 05/01/2023   CREATININE 1.91 (H) 05/01/2023   CALCIUM 9.5 05/01/2023   EGFR 45 (L) 05/01/2023   GFRNONAA 48 (L) 12/07/2019  Doing well on Farxiga 10 mg daily Continue current medication Avoid NSAIDs and other nephrotoxic agents Follow-up with nephrologist      Relevant Medications   dapagliflozin propanediol (FARXIGA) 10 MG TABS tablet     Other   Obesity    Wt Readings from Last 3 Encounters:  05/22/23 253 lb 12.8 oz (115.1 kg)  04/24/23 259 lb 9.6 oz (117.8 kg)  03/13/23 264 lb (119.7 kg)   Body mass index is 38.59 kg/m.  He has lost about 6 pounds since last visit Patient congratulated on his efforts at losing weight Start phentermine 37.5 mg daily He was counseled on low-carb modified diet Encouraged to engage in regular moderate to vigorous exercises at least 150 minutes weekly Follow-up in 3 months      Relevant Medications   dapagliflozin propanediol (FARXIGA) 10 MG TABS tablet   phentermine 37.5 MG capsule (Start on 05/25/2023)   Hyperlipidemia    Lab Results  Component Value Date   CHOL 222 (H) 05/01/2023   HDL 49 05/01/2023   LDLCALC 161 (H) 05/01/2023   TRIG 39 05/01/2023   CHOLHDL  4.5 05/01/2023  He has started taking pitavastatin 4 mg daily Continue current medication Avoid fatty fried foods Will recheck labs at next visit       Meds ordered this encounter  Medications   dapagliflozin propanediol (FARXIGA) 10 MG TABS tablet    Sig: Take 1 tablet (10 mg total) by mouth daily before breakfast.    Dispense:  30 tablet    Refill:  3   phentermine 37.5 MG capsule    Sig: Take 1 capsule (37.5 mg total)  by mouth every morning.    Dispense:  30 capsule    Refill:  2    Follow-up: Return in about 3 months (around 08/22/2023) for HYPERLIPIDEMIA obesity.    Donell Beers, FNP

## 2023-06-11 NOTE — Progress Notes (Unsigned)
[facility] [address] [place] [address] Phone: [phone] Subjective:   Alex Mitchell, am serving as a scribe for Dr. Antoine Primas.  I'm seeing this patient by the request  of:  Donell Beers, FNP  CC: Left knee pain and tightness  [id number]  03/13/2023 Patient does have some reaccumulation starting again in the knee.  We discussed with him at great length.  We discussed icing regimen and compression.  Patient is having no instability of the knee.  We discussed further imaging could be available if necessary.  Patient is now able to workout on a regular basis and do his job without any difficulties follow-up with me again in 2 to 3 months to make sure that there is no worsening reaccumulation RTC in 6 weeks       Update 06/12/2023 Alex Mitchell is a 42 y.o. male coming in with complaint of L knee pain. 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  Patient states that it is affecting daily activities, will come up at night.  Does feel that the change in the car contributed to some more of the discomfort and pain.      Past Medical History:  Diagnosis Date   AIDS (acquired immune deficiency syndrome) (HCC) 10/31/2018   [place]    Elevated serum creatinine    Hyperlipidemia    Mouth sores    Obesity    Prediabetes    No past surgical history on file. Social History   Socioeconomic History   Marital status: Single    Spouse name: Not on file   Number of children: Not on file   Years of education: Not on file   Highest education level: Not on file  Occupational History   Not on file  Tobacco Use   Smoking status: Never   Smokeless tobacco: Never  Vaping Use   Vaping status: Never Used  Substance and Sexual Activity   Alcohol use: Not Currently   Drug use: No   Sexual activity: Not Currently  Other Topics Concern   Not on file  Social  History Narrative   Lives with his room mate    Social Determinants of Health   Financial Resource Strain: Not on file  Food Insecurity: Not on file  Transportation Needs: Not on file  Physical Activity: Not on file  Stress: Not on file  Social Connections: Unknown (03/22/2022)   Received from [facility]   Social Network    Social Network: Not on file   No Known Allergies Family History  Problem Relation Age of Onset   Hypertension Father    Hypertension Other     Current Outpatient Medications (Endocrine & Metabolic):    dapagliflozin propanediol (FARXIGA) 10 MG TABS tablet, Take 1 tablet (10 mg total) by mouth daily before breakfast.  Current Outpatient Medications (Cardiovascular):    Pitavastatin Calcium 4 MG TABS, Take 1 tablet (4 mg total) by mouth daily.  Current Outpatient Medications (Respiratory):    albuterol (VENTOLIN HFA) 108 (90 Base) MCG/ACT inhaler, Inhale 1-2 puffs into the lungs every 6 (six) hours as needed.   cetirizine (ZYRTEC) 10 MG tablet, Take 1 tablet (10 mg total) by mouth daily.   fluticasone (FLONASE) 50 MCG/ACT nasal spray, Place 2 sprays into both nostrils daily.   Current Outpatient Medications (Hematological):    ferrous sulfate 325 (65 FE) MG tablet, Take  325 mg by mouth daily with breakfast.  Current Outpatient Medications (Other):    Cranberry 250 MG CHEW, Chew by mouth.   dolutegravir-lamiVUDine (DOVATO) 50-300 MG tablet, TAKE 1 TABLET BY MOUTH DAILY   phentermine 37.5 MG capsule, Take 1 capsule (37.5 mg total) by mouth every morning.   Reviewed prior external information including notes and imaging from  primary care provider As well as notes that were available from care everywhere and other healthcare systems.  Past medical history, social, surgical and family history all reviewed in electronic medical record.  No pertanent information unless stated regarding to the chief complaint.   Review of Systems:  No headache, visual  changes, nausea, vomiting, diarrhea, constipation, dizziness, abdominal pain, skin rash, fevers, chills, night sweats, weight loss, swollen lymph nodes, body aches, joint swelling, chest pain, shortness of breath, mood changes. POSITIVE muscle aches  Objective  Blood pressure 122/64, pulse 79, height 5\' 8"  (1.727 m), weight 245 lb (111.1 kg), SpO2 98%.   General: No apparent distress alert and oriented x3 mood and affect normal, dressed appropriately.  HEENT: Pupils equal, extraocular movements intact  Respiratory: Patient's speak in full sentences and does not appear short of breath  Cardiovascular: No lower extremity edema, non tender, no erythema  Left knee patient does have an antalgic gait noted.  No significant effusion noted.  Flexion to only 95 degrees.  Full extension noted.  Procedure: Real-time Ultrasound Guided Injection of left knee Device: GE Logiq Q7 Ultrasound guided injection is preferred based studies that show increased duration, increased effect, greater accuracy, decreased procedural pain, increased response rate, and decreased cost with ultrasound guided versus blind injection.  Verbal informed consent obtained.  Time-out conducted.  Noted no overlying erythema, induration, or other signs of local infection.  Skin prepped in a sterile fashion.  Local anesthesia: Topical Ethyl chloride.  With sterile technique and under real time ultrasound guidance: With a 22-gauge 2 inch needle patient was injected with 4 cc of 0.5% Marcaine and aspirated 80 cc of straw-colored fluid then injected 1 cc of Kenalog 40 mg/dL. This was from a superior lateral approach.  Completed without difficulty  Pain immediately resolved suggesting accurate placement of the medication.  Advised to call if fevers/chills, erythema, induration, drainage, or persistent bleeding.  Impression: Technically successful ultrasound guided injection.    Impression and Recommendations:     The above  documentation has been reviewed and is accurate and complete Judi Saa, DO

## 2023-06-12 ENCOUNTER — Other Ambulatory Visit: Payer: Self-pay

## 2023-06-12 ENCOUNTER — Ambulatory Visit (INDEPENDENT_AMBULATORY_CARE_PROVIDER_SITE_OTHER): Payer: 59 | Admitting: Family Medicine

## 2023-06-12 ENCOUNTER — Encounter: Payer: Self-pay | Admitting: Family Medicine

## 2023-06-12 VITALS — BP 122/64 | HR 79 | Ht 68.0 in | Wt 245.0 lb

## 2023-06-12 DIAGNOSIS — M25462 Effusion, left knee: Secondary | ICD-10-CM | POA: Diagnosis not present

## 2023-06-12 DIAGNOSIS — M25562 Pain in left knee: Secondary | ICD-10-CM

## 2023-06-12 NOTE — Patient Instructions (Addendum)
Good to see you Drained the knee again  If it comes back again need to consider MRI Letter written See me again 2 months

## 2023-06-12 NOTE — Assessment & Plan Note (Signed)
Repeat aspiration again today.  It has been 6 months.  We discussed with patient though that if this continues to recur further evaluation with advanced imaging is necessary.  Patient will watch for any type of reaccumulation or increasing instability.  Has failed now potentially 2 injections of this, home exercises prescribed physical therapy.  X-rays have shown some mild arthritic changes but nothing severe.  Follow-up with me again in 8 weeks

## 2023-06-13 ENCOUNTER — Other Ambulatory Visit: Payer: Self-pay

## 2023-06-13 DIAGNOSIS — Z21 Asymptomatic human immunodeficiency virus [HIV] infection status: Secondary | ICD-10-CM

## 2023-06-13 MED ORDER — DOVATO 50-300 MG PO TABS
1.0000 | ORAL_TABLET | Freq: Every day | ORAL | 5 refills | Status: DC
Start: 2023-06-13 — End: 2023-06-14

## 2023-06-14 ENCOUNTER — Other Ambulatory Visit (HOSPITAL_COMMUNITY): Payer: Self-pay

## 2023-06-14 ENCOUNTER — Other Ambulatory Visit: Payer: Self-pay | Admitting: Infectious Diseases

## 2023-06-14 ENCOUNTER — Other Ambulatory Visit: Payer: Self-pay

## 2023-06-14 ENCOUNTER — Telehealth: Payer: Self-pay

## 2023-06-14 DIAGNOSIS — E785 Hyperlipidemia, unspecified: Secondary | ICD-10-CM

## 2023-06-14 DIAGNOSIS — E7849 Other hyperlipidemia: Secondary | ICD-10-CM

## 2023-06-14 DIAGNOSIS — Z21 Asymptomatic human immunodeficiency virus [HIV] infection status: Secondary | ICD-10-CM

## 2023-06-14 MED ORDER — ROSUVASTATIN CALCIUM 10 MG PO TABS
10.0000 mg | ORAL_TABLET | Freq: Every day | ORAL | 5 refills | Status: DC
Start: 2023-06-14 — End: 2023-06-17

## 2023-06-14 MED ORDER — ROSUVASTATIN CALCIUM 10 MG PO TABS
10.0000 mg | ORAL_TABLET | Freq: Every day | ORAL | 5 refills | Status: DC
Start: 2023-06-14 — End: 2023-06-14

## 2023-06-14 MED ORDER — DOVATO 50-300 MG PO TABS
1.0000 | ORAL_TABLET | Freq: Every day | ORAL | 5 refills | Status: DC
Start: 2023-06-14 — End: 2023-11-08

## 2023-06-14 NOTE — Addendum Note (Signed)
Addended by: Juanita Laster on: 06/14/2023 12:02 PM   Modules accepted: Orders

## 2023-06-14 NOTE — Telephone Encounter (Signed)
Called patient regarding medication change from Livalo 4 mg tablet to Rosuvastatin 10 mg. Patient did not have any questions about this prescription, but did have concern regarding Dovato. States that [facility] texted him there is an Office manager. Only has 4 pills left. Will follow up with [facility] regarding this and update patient.  Juanita Laster, RMA

## 2023-06-14 NOTE — Telephone Encounter (Signed)
Spoke with [facility] regarding patient concern. States that patient primary insurance [facility] does not have [facility] as preferred pharmacy. [facility]  Will [facility].  Juanita Laster, RMA

## 2023-06-17 ENCOUNTER — Other Ambulatory Visit: Payer: Self-pay

## 2023-06-17 MED ORDER — ATORVASTATIN CALCIUM 20 MG PO TABS: 20.0000 mg | ORAL_TABLET | Freq: Every day | ORAL | 5 refills | Status: DC

## 2023-06-21 ENCOUNTER — Other Ambulatory Visit (HOSPITAL_COMMUNITY): Payer: Self-pay

## 2023-06-21 ENCOUNTER — Telehealth: Payer: Self-pay

## 2023-06-21 ENCOUNTER — Other Ambulatory Visit: Payer: Self-pay

## 2023-06-21 MED ORDER — ATORVASTATIN CALCIUM 20 MG PO TABS: 20.0000 mg | ORAL_TABLET | Freq: Every day | ORAL | 5 refills | Status: DC

## 2023-06-21 NOTE — Telephone Encounter (Signed)
[facility]ed [facility] [address] to [facility]itor that was sent in error. [facility] [address]. Juanita Laster, RMA

## 2023-07-08 ENCOUNTER — Telehealth: Payer: 59 | Admitting: Emergency Medicine

## 2023-07-08 DIAGNOSIS — H6992 Unspecified Eustachian tube disorder, left ear: Secondary | ICD-10-CM

## 2023-07-09 MED ORDER — FLUTICASONE PROPIONATE 50 MCG/ACT NA SUSP: 2.0000 | Freq: Every day | NASAL | 0 refills | Status: DC

## 2023-07-09 NOTE — Progress Notes (Signed)
E-Visit for Ear Pain - Eustachian Tube Dysfunction   We are sorry that you are not feeling well. Here is how we plan to help!  Based on what you have shared with me it looks like you have Eustachian Tube Dysfunction.  Eustachian Tube Dysfunction is a condition where the tubes that connect your middle ears to your upper throat become blocked. This can lead to discomfort, hearing difficulties and a feeling of fullness in your ear. Eustachian tube dysfunction usually resolves itself in a few days. The usual symptoms include: Hearing problems Tinnitus, or ringing in your ears Clicking or popping sounds A feeling of fullness in your ears Pain that mimics an ear infection Dizziness, vertigo or balance problems A "tickling" sensation in your ears  ?Eustachian tube dysfunction symptoms may get worse in higher altitudes. This is called barotrauma, and it can happen while scuba diving, flying in an airplane or driving in the mountains.   What causes eustachian tube dysfunction? Allergies and infections (like the common cold and the flu) are the most common causes of eustachian tube dysfunction. These conditions can cause inflammation and mucus buildup, leading to blockage. GERD, or chronic acid reflux, can also cause ETD. This is because stomach acid can back up into your throat and result in inflammation. As mentioned above, altitude changes can also cause ETD.   What are some common eustachian tube dysfunction treatments? In most cases, treatment isn't necessary because ETD often resolves on its own. However, you might need treatment if your symptoms linger for more than two weeks.    Eustachian tube dysfunction treatment depends on the cause and the severity of your condition. Treatments may include home remedies, medications or, in severe cases, surgery.     HOME CARE: Sometimes simple home remedies can help with mild cases of eustachian tube dysfunction. To try and clear the blockage, you  can: Chew gum. Yawn. Swallow. Try the Valsalva maneuver (breathing out forcefully while closing your mouth and pinching your nostrils). Use a saline spray to clear out nasal passages.  MEDICATIONS: Over-the-counter medications can help if allergies are causing eustachian tube dysfunction. Try antihistamines (like cetirizine (brand is Zyrtec) or diphenhydramine) to ease your symptoms. If you have discomfort, pain relievers -- such as acetaminophen or ibuprofen -- can help.    .Nasal saline (salt water spray) can help a lot. Saline irrigation (larger volume of saline) is even better. Try using saline irrigation, such as with a neti pot, several times a day while you are sick. Many neti pots come with salt packets premeasured to use to make saline. If you use your own salt, make sure it is kosher salt or sea salt (don't use table salt as it has iodine in it and you don't need that in your nose). Use distilled water to make saline. If you mix your own saline using your own salt, the recipe is 1/4 teaspoon salt in 1 cup warm water. Using saline irrigation can help prevent and treat sinus infections, ear infections, colds, allergies, and clogged ears like what you are experiencing now.   Sometimes intranasal glucocorticosteroids (like Flonase or Nasacort) help.  I have prescribed Fluticasone 50 mcg/spray 2 sprays in each nostril daily for 10-14 days    GET HELP RIGHT AWAY IF: Fever is over 102.2 degrees. You develop progressive ear pain or hearing loss. Ear symptoms persist longer than 3 days after treatment.  MAKE SURE YOU: Understand these instructions. Will watch your condition. Will get help right away if  you are not doing well or get worse.  Thank you for choosing an e-visit.  Your e-visit answers were reviewed by a board certified advanced clinical practitioner to complete your personal care plan. Depending upon the condition, your plan could have included both over the counter or  prescription medications.  Please review your pharmacy choice. Make sure the pharmacy is open so you can pick up the prescription now. If there is a problem, you may contact your provider through [employer] and have the prescription routed to another pharmacy.  Your safety is important to [place]. If you have drug allergies check your prescription carefully.   For the next 24 hours you can use MyChart to ask questions about today's visit, request a non-urgent call back, or ask for a work or school excuse. You will get an email with a survey after your eVisit asking about your experience. We would appreciate your feedback. I hope that your e-visit has been valuable and will aid in your recovery.  I have spent 5 minutes in review of e-visit questionnaire, review and updating patient chart, medical decision making and response to patient.   Rica Mast, PhD, FNP-BC

## 2023-07-16 ENCOUNTER — Telehealth (INDEPENDENT_AMBULATORY_CARE_PROVIDER_SITE_OTHER): Payer: 59

## 2023-07-16 ENCOUNTER — Other Ambulatory Visit: Payer: Self-pay

## 2023-07-16 DIAGNOSIS — Z79899 Other long term (current) drug therapy: Secondary | ICD-10-CM

## 2023-07-16 MED ORDER — DOXYCYCLINE HYCLATE 100 MG PO TABS: 200.0000 mg | ORAL_TABLET | ORAL | 0 refills | Status: DC

## 2023-07-16 NOTE — Addendum Note (Signed)
Addended by: Blanchard Kelch on: 07/16/2023 01:18 PM   Modules accepted: Orders

## 2023-07-16 NOTE — Telephone Encounter (Signed)
[facility] [address]. RX canceled at [facility] on [address].    Mikailah Morel Lesli Albee, CMA

## 2023-07-16 NOTE — Telephone Encounter (Signed)
Called patient regarding voicemail requesting call back. States that he is interested in doxy pep and would like to know if this is something he could be prescribed.  [facility] [address]. Will forward message to provider. Juanita Laster, RMA

## 2023-07-16 NOTE — Telephone Encounter (Signed)
Please see the MyChart message reply(ies) for my assessment and plan.    This patient gave consent for this Medical Advice Message and is aware that it may result in a bill to [employer], as well as the possibility of receiving a bill for a co-payment or deductible. They are an established patient, but are not seeking medical advice exclusively about a problem treated during an in person or video visit in the last seven days. I did not recommend an in person or video visit within seven days of my reply.    I spent a total of 6 minutes cumulative time within 7 days through [employer].  Rexene Alberts, NP

## 2023-07-25 ENCOUNTER — Other Ambulatory Visit (HOSPITAL_COMMUNITY): Payer: Self-pay

## 2023-07-26 ENCOUNTER — Other Ambulatory Visit: Payer: Self-pay | Admitting: Nurse Practitioner

## 2023-07-26 ENCOUNTER — Telehealth: Payer: Self-pay | Admitting: Nurse Practitioner

## 2023-07-26 DIAGNOSIS — N1831 Chronic kidney disease, stage 3a: Secondary | ICD-10-CM

## 2023-07-26 MED ORDER — DAPAGLIFLOZIN PROPANEDIOL 10 MG PO TABS
10.0000 mg | ORAL_TABLET | Freq: Every day | ORAL | 3 refills | Status: DC
Start: 2023-07-26 — End: 2023-07-29

## 2023-07-26 NOTE — Telephone Encounter (Signed)
Caller & Relationship to patient:  MRN #  [id number]   Call Back Number:   Date of Last Office Visit: 05/22/2023     Date of Next Office Visit: 08/28/2023    Medication(s) to be Refilled: Marcelline Deist  Preferred Pharmacy:   ** Please notify patient to allow 48-72 hours to process** **Let patient know to contact pharmacy at the end of the day to make sure medication is ready. ** **If patient has not been seen in a year or longer, book an appointment **Advise to use MyChart for refill requests OR to contact their pharmacy

## 2023-07-29 ENCOUNTER — Other Ambulatory Visit: Payer: Self-pay

## 2023-07-29 DIAGNOSIS — N1831 Chronic kidney disease, stage 3a: Secondary | ICD-10-CM

## 2023-07-29 MED ORDER — DAPAGLIFLOZIN PROPANEDIOL 10 MG PO TABS
10.0000 mg | ORAL_TABLET | Freq: Every day | ORAL | 3 refills | Status: DC
Start: 2023-07-29 — End: 2023-12-11

## 2023-07-29 NOTE — Telephone Encounter (Signed)
Done KH 

## 2023-08-07 ENCOUNTER — Telehealth: Payer: Self-pay | Admitting: Nurse Practitioner

## 2023-08-07 NOTE — Telephone Encounter (Signed)
Caller & Relationship to patient:  MRN #  [id number]   Call Back Number:   Date of Last Office Visit: 07/29/2023     Date of Next Office Visit: 08/28/2023    Medication(s) to be Refilled: Marcelline Deist 10mg   [facility] [address]  ** Please notify patient to allow 48-72 hours to process** **Let patient know to contact pharmacy at the end of the day to make sure medication is ready. ** **If patient has not been seen in a year or longer, book an appointment **Advise to use MyChart for refill requests OR to contact their pharmacy

## 2023-08-07 NOTE — Telephone Encounter (Signed)
Patient started this medication 07/29/2023 30 day supply with 2 refills.

## 2023-08-13 ENCOUNTER — Encounter: Payer: Self-pay | Admitting: Internal Medicine

## 2023-08-13 ENCOUNTER — Other Ambulatory Visit: Payer: Self-pay | Admitting: Internal Medicine

## 2023-08-13 DIAGNOSIS — E669 Obesity, unspecified: Secondary | ICD-10-CM

## 2023-08-13 DIAGNOSIS — E611 Iron deficiency: Secondary | ICD-10-CM

## 2023-08-13 DIAGNOSIS — N1831 Chronic kidney disease, stage 3a: Secondary | ICD-10-CM

## 2023-08-13 DIAGNOSIS — Z21 Asymptomatic human immunodeficiency virus [HIV] infection status: Secondary | ICD-10-CM

## 2023-08-13 NOTE — Progress Notes (Unsigned)
[facility] [address] [place] [address] Phone: [phone] Subjective:   Alex Mitchell, am serving as a scribe for Dr. Antoine Mitchell.  I'm seeing this patient by the request  of:  Alex Beers, FNP  CC: Left knee pain  [id number]  06/12/2023 Repeat aspiration again today.  It has been 6 months.  We discussed with patient though that if this continues to recur further evaluation with advanced imaging is necessary.  Patient will watch for any type of reaccumulation or increasing instability.  Has failed now potentially 2 injections of this, home exercises prescribed physical therapy.  X-rays have shown some mild arthritic changes but nothing severe.  Follow-up with me again in 8 weeks     Update 08/14/2023 Alex Mitchell is a 42 y.o. male coming in with complaint of  L knee pain. P patient was having repeat inflammation and swelling of the knee.  Patient states that he has been doing better. In small van instead of big truck. Some tightness in the knee.        Past Medical History:  Diagnosis Date   AIDS (acquired immune deficiency syndrome) (HCC) 10/31/2018   [place]    Elevated serum creatinine    Hyperlipidemia    Mouth sores    Obesity    Prediabetes    No past surgical history on file. Social History   Socioeconomic History   Marital status: Single    Spouse name: Not on file   Number of children: Not on file   Years of education: Not on file   Highest education level: Not on file  Occupational History   Not on file  Tobacco Use   Smoking status: Never   Smokeless tobacco: Never  Vaping Use   Vaping status: Never Used  Substance and Sexual Activity   Alcohol use: Not Currently   Drug use: No   Sexual activity: Not Currently  Other Topics Concern   Not on file  Social History Narrative   Lives with his room mate    Social Determinants of Health   Financial Resource Strain: Not on file  Food Insecurity:  Not on file  Transportation Needs: Not on file  Physical Activity: Not on file  Stress: Not on file  Social Connections: Unknown (03/22/2022)   Received from [facility]   Social Network    Social Network: Not on file   No Known Allergies Family History  Problem Relation Age of Onset   Hypertension Father    Hypertension Other     Current Outpatient Medications (Endocrine & Metabolic):    dapagliflozin propanediol (FARXIGA) 10 MG TABS tablet, Take 1 tablet (10 mg total) by mouth daily before breakfast.  Current Outpatient Medications (Cardiovascular):    atorvastatin (LIPITOR) 20 MG tablet, Take 1 tablet (20 mg total) by mouth daily.  Current Outpatient Medications (Respiratory):    albuterol (VENTOLIN HFA) 108 (90 Base) MCG/ACT inhaler, Inhale 1-2 puffs into the lungs every 6 (six) hours as needed.   cetirizine (ZYRTEC) 10 MG tablet, Take 1 tablet (10 mg total) by mouth daily.   fluticasone (FLONASE) 50 MCG/ACT nasal spray, Place 2 sprays into both nostrils daily.   Current Outpatient Medications (Hematological):    ferrous sulfate 325 (65 FE) MG tablet, Take 325 mg by mouth daily with breakfast.  Current Outpatient Medications (Other):    Cranberry 250 MG CHEW, Chew by mouth.   dolutegravir-lamiVUDine (DOVATO) 50-300 MG tablet, Take 1  tablet by mouth daily.   doxycycline (VIBRA-TABS) 100 MG tablet, Take 2 tablets (200 mg total) by mouth See admin instructions. Take within 24-72 hours of unprotected sex   phentermine 37.5 MG capsule, Take 1 capsule (37.5 mg total) by mouth every morning.     Objective  Blood pressure 108/82, pulse 78, height 5\' 8"  (1.727 m), weight 238 lb (108 kg), SpO2 98%.   General: No apparent distress alert and oriented x3 mood and affect normal, dressed appropriately.  HEENT: Pupils equal, extraocular movements intact  Respiratory: Patient's speak in full sentences and does not appear short of breath  Cardiovascular: No lower  extremity edema, non tender, no erythema  Knee exam shows trace effusion noted but nothing severe at this time.  Seems to be able to move the knee without any symptoms no instability with valgus or varus force.  Negative McMurray's.  Limited muscular skeletal ultrasound was performed and interpreted by Alex Mitchell, M  Trace effusion noted of the patellofemoral joint and mild narrowing of the patellofemoral joint otherwise fairly unremarkable. Impression: Significant interval improvement    Impression and Recommendations:     The above documentation has been reviewed and is accurate and complete Alex Saa, DO

## 2023-08-14 ENCOUNTER — Ambulatory Visit (INDEPENDENT_AMBULATORY_CARE_PROVIDER_SITE_OTHER): Payer: 59 | Admitting: Family Medicine

## 2023-08-14 ENCOUNTER — Encounter: Payer: Self-pay | Admitting: Family Medicine

## 2023-08-14 ENCOUNTER — Ambulatory Visit: Payer: Self-pay

## 2023-08-14 ENCOUNTER — Ambulatory Visit
Admission: RE | Admit: 2023-08-14 | Discharge: 2023-08-14 | Disposition: A | Discharge status: Home / Self Care | Payer: 59 | Source: Ambulatory Visit | Attending: Internal Medicine | Admitting: Internal Medicine

## 2023-08-14 VITALS — BP 108/82 | HR 78 | Ht 68.0 in | Wt 238.0 lb

## 2023-08-14 DIAGNOSIS — E611 Iron deficiency: Secondary | ICD-10-CM

## 2023-08-14 DIAGNOSIS — Z21 Asymptomatic human immunodeficiency virus [HIV] infection status: Secondary | ICD-10-CM

## 2023-08-14 DIAGNOSIS — M25562 Pain in left knee: Secondary | ICD-10-CM | POA: Diagnosis not present

## 2023-08-14 DIAGNOSIS — N1831 Chronic kidney disease, stage 3a: Secondary | ICD-10-CM

## 2023-08-14 DIAGNOSIS — M25462 Effusion, left knee: Secondary | ICD-10-CM

## 2023-08-14 DIAGNOSIS — G8929 Other chronic pain: Secondary | ICD-10-CM | POA: Diagnosis not present

## 2023-08-14 DIAGNOSIS — E669 Obesity, unspecified: Secondary | ICD-10-CM

## 2023-08-14 NOTE — Assessment & Plan Note (Signed)
No significant swelling noted at this time.  Doing much better.  No instability noted.  At this point with patient doing well can follow-up with me as needed

## 2023-08-28 ENCOUNTER — Ambulatory Visit (INDEPENDENT_AMBULATORY_CARE_PROVIDER_SITE_OTHER): Payer: 59 | Admitting: Nurse Practitioner

## 2023-08-28 ENCOUNTER — Encounter: Payer: Self-pay | Admitting: Nurse Practitioner

## 2023-08-28 VITALS — BP 132/73 | HR 58 | Ht 68.0 in | Wt 239.0 lb

## 2023-08-28 DIAGNOSIS — Z6838 Body mass index (BMI) 38.0-38.9, adult: Secondary | ICD-10-CM

## 2023-08-28 DIAGNOSIS — Z23 Encounter for immunization: Secondary | ICD-10-CM | POA: Insufficient documentation

## 2023-08-28 DIAGNOSIS — N1831 Chronic kidney disease, stage 3a: Secondary | ICD-10-CM

## 2023-08-28 DIAGNOSIS — E66812 Obesity, class 2: Secondary | ICD-10-CM | POA: Diagnosis not present

## 2023-08-28 DIAGNOSIS — E785 Hyperlipidemia, unspecified: Secondary | ICD-10-CM | POA: Diagnosis not present

## 2023-08-28 HISTORY — DX: Encounter for immunization: Z23

## 2023-08-28 MED ORDER — PHENTERMINE HCL 37.5 MG PO CAPS
37.5000 mg | ORAL_CAPSULE | ORAL | 2 refills | Status: DC
Start: 2023-08-28 — End: 2023-12-11

## 2023-08-28 NOTE — Progress Notes (Signed)
Established Patient Office Visit  Subjective:  Patient ID: Alex Mitchell, male    DOB: 10/23/1981  Age: 42 y.o. MRN: [id number]  CC:  Chief Complaint  Patient presents with   Hyperlipidemia   Obesity    HPI Alex Mitchell is a 42 y.o. male  has a past medical history of AIDS (acquired immune deficiency syndrome) (HCC) (10/31/2018), [place], Elevated serum creatinine, Hyperlipidemia, Mouth sores, Obesity, and Prediabetes.  Patient presents for follow-up for hyperlipidemia and obesity  Patient denies any adverse reactions to current medications Please see assessment and plan section for full HPI     Past Medical History:  Diagnosis Date   AIDS (acquired immune deficiency syndrome) (HCC) 10/31/2018   [place]    Elevated serum creatinine    Hyperlipidemia    Mouth sores    Obesity    Prediabetes     No past surgical history on file.  Family History  Problem Relation Age of Onset   Hypertension Father    Hypertension Other     Social History   Socioeconomic History   Marital status: Single    Spouse name: Not on file   Number of children: Not on file   Years of education: Not on file   Highest education level: Not on file  Occupational History   Not on file  Tobacco Use   Smoking status: Never   Smokeless tobacco: Never  Vaping Use   Vaping status: Never Used  Substance and Sexual Activity   Alcohol use: Not Currently   Drug use: No   Sexual activity: Not Currently  Other Topics Concern   Not on file  Social History Narrative   Lives with his room mate    Social Determinants of Health   Financial Resource Strain: Not on file  Food Insecurity: Not on file  Transportation Needs: Not on file  Physical Activity: Not on file  Stress: Not on file  Social Connections: Unknown (03/22/2022)   Received from [facility]   Social Network    Social Network: Not on file  Intimate Partner Violence: Unknown (02/21/2022)   Received from [facility], [facility]   HITS    Physically Hurt: Not on file    Insult or Talk Down To: Not on file    Threaten Physical Harm: Not on file    Scream or Curse: Not on file    Outpatient Medications Prior to Visit  Medication Sig Dispense Refill   albuterol (VENTOLIN HFA) 108 (90 Base) MCG/ACT inhaler Inhale 1-2 puffs into the lungs every 6 (six) hours as needed. 8 g 0   atorvastatin (LIPITOR) 20 MG tablet Take 1 tablet (20 mg total) by mouth daily. 30 tablet 5   cetirizine (ZYRTEC) 10 MG tablet Take 1 tablet (10 mg total) by mouth daily. 30 tablet 11   Cranberry 250 MG CHEW Chew by mouth.     dolutegravir-lamiVUDine (DOVATO) 50-300 MG tablet Take 1 tablet by mouth daily. 30 tablet 5   ferrous sulfate 325 (65 FE) MG tablet Take 325 mg by mouth daily with breakfast.     fluticasone (FLONASE) 50 MCG/ACT nasal spray Place 2 sprays into both nostrils daily. 16 g 0   phentermine 37.5 MG capsule Take 1 capsule (37.5 mg total) by mouth every morning. 30 capsule 2   dapagliflozin propanediol (FARXIGA) 10 MG TABS tablet Take 1 tablet (10 mg total) by mouth daily before breakfast. (Patient not taking: Reported on 08/28/2023) 30 tablet 3  doxycycline (VIBRA-TABS) 100 MG tablet Take 2 tablets (200 mg total) by mouth See admin instructions. Take within 24-72 hours of unprotected sex (Patient not taking: Reported on 08/28/2023) 30 tablet 0   No facility-administered medications prior to visit.    No Known Allergies  ROS Review of Systems  Constitutional:  Negative for activity change, appetite change, chills, diaphoresis, fatigue, fever and unexpected weight change.  HENT:  Negative for congestion, dental problem, drooling and ear discharge.   Eyes:  Negative for pain, discharge, redness and itching.  Respiratory:  Negative for apnea, cough, choking, chest tightness, shortness of breath and wheezing.   Cardiovascular: Negative.  Negative for chest pain, palpitations and leg swelling.   Gastrointestinal:  Negative for abdominal distention, abdominal pain, anal bleeding, blood in stool, constipation, diarrhea and vomiting.  Endocrine: Negative for polydipsia, polyphagia and polyuria.  Genitourinary:  Negative for difficulty urinating, flank pain, frequency and genital sores.  Musculoskeletal: Negative.  Negative for arthralgias, back pain, gait problem and joint swelling.  Skin:  Negative for color change, pallor and rash.  Neurological:  Negative for dizziness, facial asymmetry, light-headedness, numbness and headaches.  Psychiatric/Behavioral:  Negative for agitation, behavioral problems, confusion, hallucinations, self-injury, sleep disturbance and suicidal ideas.       Objective:    Physical Exam Vitals and nursing note reviewed.  Constitutional:      General: He is not in acute distress.    Appearance: Normal appearance. He is obese. He is not ill-appearing, toxic-appearing or diaphoretic.  HENT:     Mouth/Throat:     Mouth: Mucous membranes are moist.     Pharynx: Oropharynx is clear. No oropharyngeal exudate or posterior oropharyngeal erythema.  Eyes:     General: No scleral icterus.       Right eye: No discharge.        Left eye: No discharge.     Extraocular Movements: Extraocular movements intact.     Conjunctiva/sclera: Conjunctivae normal.  Cardiovascular:     Rate and Rhythm: Normal rate and regular rhythm.     Pulses: Normal pulses.     Heart sounds: Normal heart sounds. No murmur heard.    No friction rub. No gallop.  Pulmonary:     Effort: Pulmonary effort is normal. No respiratory distress.     Breath sounds: Normal breath sounds. No stridor. No wheezing, rhonchi or rales.  Chest:     Chest wall: No tenderness.  Abdominal:     General: There is no distension.     Palpations: Abdomen is soft.     Tenderness: There is no abdominal tenderness. There is no right CVA tenderness, left CVA tenderness or guarding.  Musculoskeletal:        General:  No swelling, tenderness, deformity or signs of injury.     Right lower leg: No edema.     Left lower leg: No edema.  Skin:    General: Skin is warm and dry.     Capillary Refill: Capillary refill takes less than 2 seconds.     Coloration: Skin is not jaundiced or pale.     Findings: No bruising, erythema or lesion.  Neurological:     Mental Status: He is alert and oriented to person, place, and time.     Motor: No weakness.     Coordination: Coordination normal.     Gait: Gait normal.  Psychiatric:        Mood and Affect: Mood normal.        Behavior:  Behavior normal.        Thought Content: Thought content normal.        Judgment: Judgment normal.     BP 132/73   Pulse (!) 58   Ht 5\' 8"  (1.727 m)   Wt 239 lb (108.4 kg)   SpO2 100%   BMI 36.34 kg/m  Wt Readings from Last 3 Encounters:  08/28/23 239 lb (108.4 kg)  08/14/23 238 lb (108 kg)  06/12/23 245 lb (111.1 kg)    Lab Results  Component Value Date   TSH 1.540 09/05/2022   Lab Results  Component Value Date   WBC 6.2 05/01/2023   WBC 6.1 05/01/2023   HGB 12.6 (L) 05/01/2023   HGB 12.7 (L) 05/01/2023   HCT 37.1 (L) 05/01/2023   HCT 38.2 05/01/2023   MCV 95.6 05/01/2023   MCV 97 05/01/2023   PLT 219 05/01/2023   PLT 211 05/01/2023   Lab Results  Component Value Date   NA 136 05/01/2023   K 3.9 05/01/2023   CO2 24 05/01/2023   GLUCOSE 99 05/01/2023   BUN 20 05/01/2023   CREATININE 1.91 (H) 05/01/2023   BILITOT 0.6 05/01/2023   ALKPHOS 54 04/24/2023   AST 20 05/01/2023   ALT 11 05/01/2023   PROT 7.4 05/01/2023   ALBUMIN 4.8 04/24/2023   CALCIUM 9.5 05/01/2023   ANIONGAP 11 10/29/2018   EGFR 45 (L) 05/01/2023   Lab Results  Component Value Date   CHOL 222 (H) 05/01/2023   Lab Results  Component Value Date   HDL 49 05/01/2023   Lab Results  Component Value Date   LDLCALC 161 (H) 05/01/2023   Lab Results  Component Value Date   TRIG 39 05/01/2023   Lab Results  Component Value Date    CHOLHDL 4.5 05/01/2023   Lab Results  Component Value Date   HGBA1C 5.8 (H) 10/24/2022      Assessment & Plan:   Problem List Items Addressed This Visit       Genitourinary   Stage 3a chronic kidney disease (HCC)    Lab Results  Component Value Date   NA 136 05/01/2023   K 3.9 05/01/2023   CO2 24 05/01/2023   GLUCOSE 99 05/01/2023   BUN 20 05/01/2023   CREATININE 1.91 (H) 05/01/2023   CALCIUM 9.5 05/01/2023   EGFR 45 (L) 05/01/2023   GFRNONAA 48 (L) 12/07/2019  Patient stated that he has seen the nephrologist, had an ultrasound done, nephrology told him that he does not need Marcelline Deist since his kidney function has been stable, he also was told that he does not need to follow-up with them. Rechecking BMP today, avoid NSAIDs and other nephrotoxic agents I did tell the patient to consider taking Farxiga to slow down the progression of his kidney disease We will follow-up in 3 months      Relevant Orders   Basic Metabolic Panel     Other   Obesity    Wt Readings from Last 3 Encounters:  08/28/23 239 lb (108.4 kg)  08/14/23 238 lb (108 kg)  06/12/23 245 lb (111.1 kg)   Body mass index is 36.34 kg/m.  Currently on phentermine 37.5 mg daily Patient has lost 6 pounds since his last visit 3 months ago He Walks and jog  3 days a weeks for an hour on each day  Constantly moving on the job Has been avoiding  fried foods, drinking mainly water.  Patient counseled on low-carb modified diet Was  encouraged to continue to engage in moderate to vigorous exercises at least 150 minutes weekly Will do phentermine for another 3 months, Medication refilled      Relevant Medications   phentermine 37.5 MG capsule   Hyperlipidemia - Primary    Lab Results  Component Value Date   CHOL 222 (H) 05/01/2023   HDL 49 05/01/2023   LDLCALC 161 (H) 05/01/2023   TRIG 39 05/01/2023   CHOLHDL 4.5 05/01/2023  Currently on atorvastatin 20 mg daily Checking lipid panel      Relevant Orders    Lipid panel   Encounter for immunization    Patient educated on CDC recommendation for the  Influenza vaccine. Verbal consent was obtained from the patient, vaccine administered by nurse, no sign of adverse reactions noted at this time. Patient education on arm soreness and use of tylenol for this patient  was discussed. Patient educated on the signs and symptoms of adverse effect and advise to contact the office if they occur. Vaccine information sheet given to patient.        Relevant Orders   Flu vaccine trivalent PF, 6mos and older(Flulaval,Afluria,Fluarix,Fluzone) (Completed)    Meds ordered this encounter  Medications   phentermine 37.5 MG capsule    Sig: Take 1 capsule (37.5 mg total) by mouth every morning.    Dispense:  30 capsule    Refill:  2    Follow-up: Return in about 3 months (around 11/28/2023) for obesity .    Donell Beers, FNP

## 2023-08-28 NOTE — Assessment & Plan Note (Signed)
Lab Results  Component Value Date   CHOL 222 (H) 05/01/2023   HDL 49 05/01/2023   LDLCALC 161 (H) 05/01/2023   TRIG 39 05/01/2023   CHOLHDL 4.5 05/01/2023  Currently on atorvastatin 20 mg daily Checking lipid panel

## 2023-08-28 NOTE — Assessment & Plan Note (Signed)
Wt Readings from Last 3 Encounters:  08/28/23 239 lb (108.4 kg)  08/14/23 238 lb (108 kg)  06/12/23 245 lb (111.1 kg)   Body mass index is 36.34 kg/m.  Walk and jog on 3 days a weeks , for an hour  Contasnatly moving on the job No fried foods, drinking mainly water.

## 2023-08-28 NOTE — Assessment & Plan Note (Signed)
Lab Results  Component Value Date   NA 136 05/01/2023   K 3.9 05/01/2023   CO2 24 05/01/2023   GLUCOSE 99 05/01/2023   BUN 20 05/01/2023   CREATININE 1.91 (H) 05/01/2023   CALCIUM 9.5 05/01/2023   EGFR 45 (L) 05/01/2023   GFRNONAA 48 (L) 12/07/2019  Patient stated that he has seen the nephrologist, had an ultrasound done, nephrology told him that he does not need Marcelline Deist since his kidney function has been stable, he also was told that he does not need to follow-up with them. Rechecking BMP today, avoid NSAIDs and other nephrotoxic agents I did tell the patient to consider taking Farxiga to slow down the progression of his kidney disease We will follow-up in 3 months

## 2023-08-28 NOTE — Assessment & Plan Note (Signed)
Wt Readings from Last 3 Encounters:  08/28/23 239 lb (108.4 kg)  08/14/23 238 lb (108 kg)  06/12/23 245 lb (111.1 kg)   Body mass index is 36.34 kg/m.  Currently on phentermine 37.5 mg daily Patient has lost 6 pounds since his last visit 3 months ago He Walks and jog  3 days a weeks for an hour on each day  Constantly moving on the job Has been avoiding  fried foods, drinking mainly water.  Patient counseled on low-carb modified diet Was encouraged to continue to engage in moderate to vigorous exercises at least 150 minutes weekly Will do phentermine for another 3 months, Medication refilled

## 2023-08-28 NOTE — Assessment & Plan Note (Signed)
Patient educated on CDC recommendation for the  Influenza vaccine. Verbal consent was obtained from the patient, vaccine administered by nurse, no sign of adverse reactions noted at this time. Patient education on arm soreness and use of tylenol for this patient  was discussed. Patient educated on the signs and symptoms of adverse effect and advise to contact the office if they occur. Vaccine information sheet given to patient.

## 2023-08-28 NOTE — Patient Instructions (Signed)
    Hyperlipidemia, unspecified hyperlipidemia type  - Lipid panel   Stage 3a chronic kidney disease (HCC)  - Basic Metabolic Panel  . Class 2 severe obesity due to excess calories with serious comorbidity and body mass index (BMI) of 38.0 to 38.9 in adult (HCC)  - phentermine 37.5 MG capsule; Take 1 capsule (37.5 mg total) by mouth every morning.  Dispense: 30 capsule; Refill: 2  It is important that you exercise regularly at least 30 minutes 5 times a week as tolerated  Think about what you will eat, plan ahead. Choose " clean, green, fresh or frozen" over canned, processed or packaged foods which are more sugary, salty and fatty. 70 to 75% of food eaten should be vegetables and fruit. Three meals at set times with snacks allowed between meals, but they must be fruit or vegetables. Aim to eat over a 12 hour period , example 7 am to 7 pm, and STOP after  your last meal of the day. Drink water,generally about 64 ounces per day, no other drink is as healthy. Fruit juice is best enjoyed in a healthy way, by EATING the fruit.  Thanks for choosing [facility] we consider it a privelige to serve you.

## 2023-08-29 LAB — BASIC METABOLIC PANEL
BUN/Creatinine Ratio: 13 (ref 9–20)
BUN: 23 mg/dL (ref 6–24)
CO2: 24 mmol/L (ref 20–29)
Calcium: 10 mg/dL (ref 8.7–10.2)
Chloride: 103 mmol/L (ref 96–106)
Creatinine, Ser: 1.83 mg/dL — ABNORMAL HIGH (ref 0.76–1.27)
Glucose: 98 mg/dL (ref 70–99)
Potassium: 4.8 mmol/L (ref 3.5–5.2)
Sodium: 141 mmol/L (ref 134–144)
eGFR: 47 mL/min/{1.73_m2} — ABNORMAL LOW (ref >59)

## 2023-08-29 LAB — LIPID PANEL
Chol/HDL Ratio: 3.3 {ratio} (ref 0.0–5.0)
Cholesterol, Total: 159 mg/dL (ref 100–199)
HDL: 48 mg/dL (ref >39)
LDL Chol Calc (NIH): 100 mg/dL — ABNORMAL HIGH (ref 0–99)
Triglycerides: 53 mg/dL (ref 0–149)
VLDL Cholesterol Cal: 11 mg/dL (ref 5–40)

## 2023-08-30 ENCOUNTER — Other Ambulatory Visit: Payer: Self-pay | Admitting: Nurse Practitioner

## 2023-08-30 MED ORDER — ATORVASTATIN CALCIUM 20 MG PO TABS: 20.0000 mg | ORAL_TABLET | Freq: Every day | ORAL | 1 refills | Status: DC

## 2023-09-06 ENCOUNTER — Ambulatory Visit: Payer: Self-pay | Admitting: Nurse Practitioner

## 2023-10-17 ENCOUNTER — Telehealth: Payer: 59 | Admitting: Physician Assistant

## 2023-10-17 DIAGNOSIS — J069 Acute upper respiratory infection, unspecified: Secondary | ICD-10-CM | POA: Diagnosis not present

## 2023-10-17 MED ORDER — FLUTICASONE PROPIONATE 50 MCG/ACT NA SUSP
2.0000 | Freq: Every day | NASAL | 0 refills | Status: AC
Start: 2023-10-17 — End: ?

## 2023-10-17 MED ORDER — BENZONATATE 100 MG PO CAPS
100.0000 mg | ORAL_CAPSULE | Freq: Three times a day (TID) | ORAL | 0 refills | Status: DC | PRN
Start: 2023-10-17 — End: 2023-12-11

## 2023-10-17 NOTE — Progress Notes (Signed)
I have spent 5 minutes in review of e-visit questionnaire, review and updating patient chart, medical decision making and response to patient.   Mia Milan Cody Jacklynn Dehaas, PA-C    

## 2023-10-17 NOTE — Progress Notes (Signed)

## 2023-10-23 ENCOUNTER — Other Ambulatory Visit: Payer: 59

## 2023-10-23 ENCOUNTER — Other Ambulatory Visit: Payer: Self-pay

## 2023-10-23 DIAGNOSIS — Z21 Asymptomatic human immunodeficiency virus [HIV] infection status: Secondary | ICD-10-CM

## 2023-10-24 LAB — T-HELPER CELL (CD4) - (RCID CLINIC ONLY)
CD4 % Helper T Cell: 18 % — ABNORMAL LOW (ref 33–65)
CD4 T Cell Abs: 375 /uL — ABNORMAL LOW (ref 400–1790)

## 2023-10-25 ENCOUNTER — Other Ambulatory Visit (HOSPITAL_COMMUNITY): Payer: Self-pay | Admitting: Nurse Practitioner

## 2023-10-25 ENCOUNTER — Encounter (INDEPENDENT_AMBULATORY_CARE_PROVIDER_SITE_OTHER): Payer: Self-pay

## 2023-10-25 DIAGNOSIS — J302 Other seasonal allergic rhinitis: Secondary | ICD-10-CM

## 2023-10-25 DIAGNOSIS — Z79899 Other long term (current) drug therapy: Secondary | ICD-10-CM | POA: Diagnosis not present

## 2023-10-25 LAB — COMPLETE METABOLIC PANEL WITH GFR
AG Ratio: 1.9 (calc) (ref 1.0–2.5)
ALT: 13 U/L (ref 9–46)
AST: 21 U/L (ref 10–40)
Albumin: 4.7 g/dL (ref 3.6–5.1)
Alkaline phosphatase (APISO): 51 U/L (ref 36–130)
BUN/Creatinine Ratio: 9 (calc) (ref 6–22)
BUN: 17 mg/dL (ref 7–25)
CO2: 28 mmol/L (ref 20–32)
Calcium: 9.8 mg/dL (ref 8.6–10.3)
Chloride: 102 mmol/L (ref 98–110)
Creat: 1.82 mg/dL — ABNORMAL HIGH (ref 0.60–1.29)
Globulin: 2.5 g/dL (ref 1.9–3.7)
Glucose, Bld: 95 mg/dL (ref 65–99)
Potassium: 4.4 mmol/L (ref 3.5–5.3)
Sodium: 137 mmol/L (ref 135–146)
Total Bilirubin: 0.6 mg/dL (ref 0.2–1.2)
Total Protein: 7.2 g/dL (ref 6.1–8.1)
eGFR: 47 mL/min/{1.73_m2} — ABNORMAL LOW (ref >=60)

## 2023-10-25 LAB — CBC WITH DIFFERENTIAL/PLATELET
Absolute Lymphocytes: 2507 {cells}/uL (ref 850–3900)
Absolute Monocytes: 510 {cells}/uL (ref 200–950)
Basophils Absolute: 32 {cells}/uL (ref 0–200)
Basophils Relative: 0.5 %
Eosinophils Absolute: 378 {cells}/uL (ref 15–500)
Eosinophils Relative: 6 %
HCT: 39.6 % (ref 38.5–50.0)
Hemoglobin: 13.5 g/dL (ref 13.2–17.1)
MCH: 33.3 pg — ABNORMAL HIGH (ref 27.0–33.0)
MCHC: 34.1 g/dL (ref 32.0–36.0)
MCV: 97.5 fL (ref 80.0–100.0)
MPV: 11.4 fL (ref 7.5–12.5)
Monocytes Relative: 8.1 %
Neutro Abs: 2873 {cells}/uL (ref 1500–7800)
Neutrophils Relative %: 45.6 %
Platelets: 206 10*3/uL (ref 140–400)
RBC: 4.06 10*6/uL — ABNORMAL LOW (ref 4.20–5.80)
RDW: 13.1 % (ref 11.0–15.0)
Total Lymphocyte: 39.8 %
WBC: 6.3 10*3/uL (ref 3.8–10.8)

## 2023-10-25 LAB — HIV-1 RNA QUANT-NO REFLEX-BLD
HIV 1 RNA Quant: NOT DETECTED {copies}/mL
HIV-1 RNA Quant, Log: NOT DETECTED {Log_copies}/mL

## 2023-10-25 MED ORDER — CETIRIZINE HCL 10 MG PO TABS: 10.0000 mg | ORAL_TABLET | Freq: Every day | ORAL | 11 refills | Status: AC

## 2023-10-25 MED ORDER — PREDNISONE 10 MG PO TABS: ORAL_TABLET | ORAL | 0 refills | Status: AC

## 2023-10-28 NOTE — Telephone Encounter (Signed)
Please see the MyChart message reply(ies) for my assessment and plan.    This patient gave consent for this Medical Advice Message and is aware that it may result in a bill to their insurance company, as well as the possibility of receiving a bill for a co-payment or deductible. They are an established patient, but are not seeking medical advice exclusively about a problem treated during an in person or video visit in the last seven days. I did not recommend an in person or video visit within seven days of my reply.    I spent a total of 12 minutes cumulative time within 7 days through MyChart messaging.  Haizlee Henton, NP   

## 2023-11-06 ENCOUNTER — Ambulatory Visit: Payer: 59 | Admitting: Infectious Diseases

## 2023-11-08 ENCOUNTER — Other Ambulatory Visit: Payer: Self-pay

## 2023-11-08 ENCOUNTER — Telehealth: Payer: Self-pay | Admitting: Pharmacist

## 2023-11-08 ENCOUNTER — Other Ambulatory Visit (HOSPITAL_COMMUNITY): Payer: Self-pay

## 2023-11-08 ENCOUNTER — Encounter: Payer: Self-pay | Admitting: Pharmacist

## 2023-11-08 ENCOUNTER — Ambulatory Visit (INDEPENDENT_AMBULATORY_CARE_PROVIDER_SITE_OTHER): Payer: 59 | Admitting: Infectious Diseases

## 2023-11-08 ENCOUNTER — Encounter: Payer: Self-pay | Admitting: Infectious Diseases

## 2023-11-08 VITALS — BP 137/87 | HR 71 | Temp 98.2°F | Ht 68.0 in | Wt 235.0 lb

## 2023-11-08 DIAGNOSIS — Z1212 Encounter for screening for malignant neoplasm of rectum: Secondary | ICD-10-CM

## 2023-11-08 DIAGNOSIS — Z113 Encounter for screening for infections with a predominantly sexual mode of transmission: Secondary | ICD-10-CM

## 2023-11-08 DIAGNOSIS — B20 Human immunodeficiency virus [HIV] disease: Secondary | ICD-10-CM | POA: Diagnosis not present

## 2023-11-08 DIAGNOSIS — Z21 Asymptomatic human immunodeficiency virus [HIV] infection status: Secondary | ICD-10-CM

## 2023-11-08 MED ORDER — DOXYCYCLINE HYCLATE 100 MG PO TABS: 200.0000 mg | ORAL_TABLET | ORAL | 1 refills | Status: DC

## 2023-11-08 MED ORDER — DOVATO 50-300 MG PO TABS: 1.0000 | ORAL_TABLET | Freq: Every day | ORAL | 5 refills | Status: DC

## 2023-11-08 MED ORDER — CABOTEGRAVIR & RILPIVIRINE ER 600 & 900 MG/3ML IM SUER
1.0000 | INTRAMUSCULAR | 1 refills | Status: DC
  Filled 2023-11-11: qty 6, 30d supply, fill #0

## 2023-11-08 NOTE — Telephone Encounter (Signed)
Can you investigate Cabenuva coverage for him? If it requires a PA, let's just wait to complete it until the new year. He signed contract today.  Counseled that [place] is two separate intramuscular injections in the gluteal muscle on each side for each visit. Explained that the second injection is 30 days after the initial injection then every 2 months thereafter. Discussed the need for viral load monitoring every 2 months for the first 6 months and then periodically afterwards as their provider sees the need. Discussed the rare but significant chance of developing resistance despite compliance. Explained that showing up to injection appointments is very important and warned that if 2 appointments are missed, it will be reassessed by their provider whether they are a good candidate for injection therapy. Counseled on possible side effects associated with the injections such as injection site pain, which is usually mild to moderate in nature, injection site nodules, and injection site reactions. Asked to call the clinic or send me a mychart message if they experience any issues, such as fatigue, nausea, headache, rash, or dizziness. Advised that they can take ibuprofen or tylenol for injection site pain if needed.   Margarite Gouge, PharmD, CPP, BCIDP, AAHIVP Clinical Pharmacist Practitioner Infectious Diseases Clinical Pharmacist [facility] for Infectious Disease

## 2023-11-08 NOTE — Progress Notes (Unsigned)
Name: Alex Mitchell  DOB: 12/06/1980 MRN: [id number] PCP: Donell Beers, FNP    Brief Narrative:  Alex Mitchell is a 42 y.o. 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  History of OIs: PJP pna, esophageal candidiasis   Previous Regimens: Biktarvy 09-2018  Dovato    Genotypes: Not performed in hospital   Subjective    Subjective:   Chief Complaint  Patient presents with   Follow-up      Discussed the use of AI scribe software for clinical note transcription with the patient, who gave verbal consent to proceed.  History of Present Illness   Alex Mitchell, with a history of HIV, presents with a desire to transition from oral antiretroviral therapy (ART) to injectable therapy. He expresses fatigue with pill-taking and is interested in the injectable regimen, Cabenuva. He has been adherent to his current regimen of Dovato and has had no issues with it.  In addition to his HIV management, the patient has been dealing with knee issues, for which he has seen an orthopedic specialist. He reports a positive outcome from the treatment received for his knee condition.  The patient also reports a recent need for Doxycycline, which he has used without any side effects. He requests a refill of this medication for prophylactic use.  Lastly, the patient is open to undergoing anal cancer screening, understanding the importance of early detection and prevention. He has no current symptoms prompting this screening.        Review of Systems  All other systems reviewed and are negative.      Outpatient Medications Prior to Visit  Medication Sig Dispense Refill   albuterol (VENTOLIN HFA) 108 (90 Base) MCG/ACT inhaler Inhale 1-2 puffs into the lungs every 6 (six) hours as needed. 8 g 0   atorvastatin (LIPITOR) 20 MG tablet Take 1 tablet (20 mg total) by mouth daily. 90 tablet 1   cetirizine (ZYRTEC) 10 MG tablet Take  1 tablet (10 mg total) by mouth daily. 30 tablet 11   dapagliflozin propanediol (FARXIGA) 10 MG TABS tablet Take 1 tablet (10 mg total) by mouth daily before breakfast. 30 tablet 3   fluticasone (FLONASE) 50 MCG/ACT nasal spray Place 2 sprays into both nostrils daily. 16 g 0   phentermine 37.5 MG capsule Take 1 capsule (37.5 mg total) by mouth every morning. 30 capsule 2   dolutegravir-lamiVUDine (DOVATO) 50-300 MG tablet Take 1 tablet by mouth daily. 30 tablet 5   benzonatate (TESSALON) 100 MG capsule Take 1 capsule (100 mg total) by mouth 3 (three) times daily as needed for cough. (Patient not taking: Reported on 11/08/2023) 30 capsule 0   Cranberry 250 MG CHEW Chew by mouth. (Patient not taking: Reported on 11/08/2023)     ferrous sulfate 325 (65 FE) MG tablet Take 325 mg by mouth daily with breakfast. (Patient not taking: Reported on 11/08/2023)     No facility-administered medications prior to visit.     No Known Allergies  Social History   Tobacco Use   Smoking status: Never   Smokeless tobacco: Never  Vaping Use   Vaping status: Never Used  Substance Use Topics   Alcohol use: Not Currently   Drug use: No    Social History   Substance and Sexual Activity  Sexual Activity Not Currently     Objective    Objective:   Vitals:   11/08/23 1043  BP: 137/87  Pulse:  71  Temp: 98.2 F (36.8 C)  TempSrc: Temporal  SpO2: 100%  Weight: 235 lb (106.6 kg)  Height: 5\' 8"  (1.727 m)     Body mass index is 35.73 kg/m.   Physical Exam Constitutional:      Appearance: Normal appearance. He is not ill-appearing.  HENT:     Head: Normocephalic.     Mouth/Throat:     Mouth: Mucous membranes are moist.     Pharynx: Oropharynx is clear.  Eyes:     General: No scleral icterus. Pulmonary:     Effort: Pulmonary effort is normal.  Musculoskeletal:        General: Normal range of motion.     Cervical back: Normal range of motion.  Skin:    Coloration: Skin is not  jaundiced or pale.  Neurological:     Mental Status: He is alert and oriented to person, place, and time.  Psychiatric:        Mood and Affect: Mood normal.        Judgment: Judgment normal.      Lab Results Lab Results  Component Value Date   WBC 6.3 10/23/2023   HGB 13.5 10/23/2023   HCT 39.6 10/23/2023   MCV 97.5 10/23/2023   PLT 206 10/23/2023    Lab Results  Component Value Date   CREATININE 1.82 (H) 10/23/2023   BUN 17 10/23/2023   NA 137 10/23/2023   K 4.4 10/23/2023   CL 102 10/23/2023   CO2 28 10/23/2023    Lab Results  Component Value Date   ALT 13 10/23/2023   AST 21 10/23/2023   ALKPHOS 54 04/24/2023   BILITOT 0.6 10/23/2023    Lab Results  Component Value Date   CHOL 159 08/28/2023   HDL 48 08/28/2023   LDLCALC 100 (H) 08/28/2023   TRIG 53 08/28/2023   CHOLHDL 3.3 08/28/2023   HIV 1 RNA Quant (Copies/mL)  Date Value  10/23/2023 Not Detected  05/01/2023 <20 (H)  10/24/2022 Not Detected   CD4 T Cell Abs (/uL)  Date Value  10/23/2023 375 (L)  10/24/2022 424  08/24/2021 442      Assessment & Plan:      HIV -  Stable on Dovato. Expressed interest in switching to Cabenuva (injectable regimen). Discussed the benefits and potential side effects of Cabenuva. -Check insurance coverage for Cabenuva. -Continue Dovato until further notice.  Sexual Health -  Requested refill of Doxycycline for post-exposure prophylaxis. -Refill Doxycycline prescription. -Asymptomatic screening today  Anal Cancer Screening -  Discussed the benefits of early detection and reassured that the test does not diagnose cancer. -Perform anal cancer screening today.      Meds ordered this encounter  Medications   dolutegravir-lamiVUDine (DOVATO) 50-300 MG tablet    Sig: Take 1 tablet by mouth daily.    Dispense:  30 tablet    Refill:  5   doxycycline (VIBRA-TABS) 100 MG tablet    Sig: Take 2 tablets (200 mg total) by mouth See admin instructions. Within 24-72  hours of unprotected oral or genital sex.    Dispense:  20 tablet    Refill:  1   Orders Placed This Encounter  Procedures   GC/CT Probe, Amp (Throat)   CT/NG RNA, TMA Rectal   C. trachomatis/N. gonorrhoeae RNA   RPR   No follow-ups on file. TBD based on cabenuva inquiry, patient contract completed today.    Rexene Alberts, MSN, NP-C [facility] for Infectious Disease [facility]  Group  [email or web address] Pager: [phone] Office: [phone] [facility] Main Line: [phone]

## 2023-11-08 NOTE — Addendum Note (Signed)
Addended by: Jennette Kettle on: 11/08/2023 02:59 PM   Modules accepted: Orders

## 2023-11-08 NOTE — Telephone Encounter (Signed)
Cabenuva approved through pharmacy benefits; sent script to [facility]. LVM with patient to schedule first appointment in January.   Margarite Gouge, PharmD, CPP, BCIDP, AAHIVP Clinical Pharmacist Practitioner Infectious Diseases Clinical Pharmacist [facility] for Infectious Disease

## 2023-11-08 NOTE — Patient Instructions (Signed)
Always lovely to see you!   Please continue your Dovato everyday for now until we figure out cabenuva for you. Will be a nice 2025 goal!  Your next appointment will be set after we get this verdict.   Doxy pep has been refilled for you.   What is DOXY-PEP? When you take preventative antibiotics to prevent sexually transmitted infections (STIs). This has been shown to reduce syphilis and chlamydia re-infections by up to 70% in some studies.   How to take your antibiotic? Take TWO tablets (200 mg) of Doxycycline ONCE with food and a full glass of water within 24 hours (no later than 72 hours) after condomless sex (oral or rectal).   DOXYCYCLINE Information when you take it:  Take with food --> otherwise you will likely experience some bad nausea +/- vomiting, abdominal pains Make sure you drink a full 8 oz glass of water with each dose Sit upright for 1 hour after to prevent heartburn  Common Questions:  Does oral sex count as an at risk exposure --> YES. Whether receiving or performing, you can take Doxy-PEP to help prevent this type of transmission  If I have have more than one sexual partner per day do I need a dose every time? --> NO. Only one dose in a 24 hour period of time is sufficient.  If I have several episodes of sex consecutive days in a row can I take a dose a day? --> YES. You can take this preventative dose once a day if needed.  Should I share my medication with other partners --> NO. This has not been proven to be helpful for everyone and it's best to keep your medicine yours. Plus if they have an STD already, it can lead to increased resistance to antibiotics.

## 2023-11-09 LAB — C. TRACHOMATIS/N. GONORRHOEAE RNA
C. trachomatis RNA, TMA: NOT DETECTED
N. gonorrhoeae RNA, TMA: NOT DETECTED

## 2023-11-09 LAB — CT/NG RNA, TMA RECTAL
Chlamydia Trachomatis RNA: NOT DETECTED
Neisseria Gonorrhoeae RNA: NOT DETECTED

## 2023-11-09 LAB — GC/CHLAMYDIA PROBE, AMP (THROAT)
Chlamydia trachomatis RNA: NOT DETECTED
Neisseria gonorrhoeae RNA: NOT DETECTED

## 2023-11-10 LAB — RPR: RPR Ser Ql: NONREACTIVE

## 2023-11-11 ENCOUNTER — Other Ambulatory Visit: Payer: Self-pay

## 2023-11-11 ENCOUNTER — Other Ambulatory Visit (HOSPITAL_COMMUNITY): Payer: Self-pay

## 2023-11-11 ENCOUNTER — Encounter: Payer: Self-pay | Admitting: Pharmacist

## 2023-11-11 LAB — CYTOLOGY - NON PAP

## 2023-11-11 LAB — NON-GYN, SPECIMEN A

## 2023-11-11 NOTE — Progress Notes (Signed)
Specialty Pharmacy Initial Fill Coordination Note  Staffon Saslow is a 42 y.o. male contacted today regarding initial fill of specialty medication(s) Cabotegravir & Rilpivirine (CABENUVA)   Patient requested Courier to Provider Office   Delivery date: 11/21/23   Verified address: [address] [place] [address]   Medication will be filled on 11/19/23.   Patient is aware of 0.00 copayment.

## 2023-11-19 ENCOUNTER — Other Ambulatory Visit: Payer: Self-pay

## 2023-11-19 ENCOUNTER — Other Ambulatory Visit: Payer: Self-pay | Admitting: Infectious Diseases

## 2023-11-21 ENCOUNTER — Telehealth: Payer: Self-pay

## 2023-11-21 NOTE — Telephone Encounter (Signed)
 [facility] Patient Advocate Encounter  Patient's medications CABENUVA  have been couriered to [facility] from [facility] and will be administered at the patients appointment on 11/27/23.  Charmaine Sharps, CPhT Specialty Pharmacy Patient [facility] Phone: [phone] Fax:  [phone]

## 2023-11-26 NOTE — Progress Notes (Deleted)
 HPI: Alex Mitchell is a 43 y.o. male who presents to the [facility] pharmacy clinic for Cabenuva  administration.  Patient Active Problem List   Diagnosis Date Noted   Encounter for immunization 08/28/2023   Stage 3a chronic kidney disease (HCC) 05/22/2023   Effusion of left knee 12/19/2022   Left knee injury, initial encounter 11/08/2022   Prediabetes 11/08/2022   Annual physical exam 09/05/2022   Obesity 09/05/2022   Need for influenza vaccination 09/05/2022   Hyperlipidemia 09/05/2022   Neuropathy 03/30/2021   Elevated serum creatinine 03/29/2020   BMI 37.0-37.9, adult 09/30/2019   Seasonal allergies 09/30/2019   Iron deficiency anemia 05/13/2019   Healthcare maintenance 10/31/2018   HIV disease (HCC) 10/31/2018    Patient's Medications  New Prescriptions   No medications on file  Previous Medications   ALBUTEROL  (VENTOLIN  HFA) 108 (90 BASE) MCG/ACT INHALER    Inhale 1-2 puffs into the lungs every 6 (six) hours as needed.   ATORVASTATIN  (LIPITOR) 20 MG TABLET    TAKE 1 TABLET BY MOUTH EVERY DAY   BENZONATATE  (TESSALON ) 100 MG CAPSULE    Take 1 capsule (100 mg total) by mouth 3 (three) times daily as needed for cough.   CABOTEGRAVIR  & RILPIVIRINE  ER (CABENUVA ) 600 & 900 MG/3ML INJECTION    Inject 1 kit into the muscle every 30 (thirty) days.   CETIRIZINE  (ZYRTEC ) 10 MG TABLET    Take 1 tablet (10 mg total) by mouth daily.   CRANBERRY 250 MG CHEW    Chew by mouth.   DAPAGLIFLOZIN  PROPANEDIOL (FARXIGA ) 10 MG TABS TABLET    Take 1 tablet (10 mg total) by mouth daily before breakfast.   DOLUTEGRAVIR -LAMIVUDINE  (DOVATO ) 50-300 MG TABLET    Take 1 tablet by mouth daily.   DOXYCYCLINE  (VIBRA -TABS) 100 MG TABLET    Take 2 tablets (200 mg total) by mouth See admin instructions. Within 24-72 hours of unprotected oral or genital sex.   FLUTICASONE  (FLONASE ) 50 MCG/ACT NASAL SPRAY    Place 2 sprays into both nostrils daily.   PHENTERMINE  37.5 MG CAPSULE    Take 1 capsule (37.5 mg total) by  mouth every morning.  Modified Medications   No medications on file  Discontinued Medications   No medications on file    Allergies: No Known Allergies  Labs: Lab Results  Component Value Date   HIV1RNAQUANT Not Detected 10/23/2023   HIV1RNAQUANT <20 (H) 05/01/2023   HIV1RNAQUANT Not Detected 10/24/2022   CD4TABS 375 (L) 10/23/2023   CD4TABS 424 10/24/2022   CD4TABS 442 08/24/2021    RPR and STI Lab Results  Component Value Date   LABRPR NON-REACTIVE 11/08/2023   LABRPR NON-REACTIVE 05/01/2023   LABRPR NON-REACTIVE 10/24/2022   LABRPR NON-REACTIVE 08/24/2021   LABRPR NON-REACTIVE 03/29/2020    STI Results GC CT  05/01/2023 10:06 AM Negative  Negative   10/24/2022 11:02 AM Negative  Negative     Hepatitis B Lab Results  Component Value Date   HEPBSAB REACTIVE (A) 02/08/2021   HEPBSAG Negative 10/21/2018   Hepatitis C No results found for: HEPCAB, HCVRNAPCRQN Hepatitis A Lab Results  Component Value Date   HAV NON-REACTIVE 02/08/2021   Lipids: Lab Results  Component Value Date   CHOL 159 08/28/2023   TRIG 53 08/28/2023   HDL 48 08/28/2023   CHOLHDL 3.3 08/28/2023   LDLCALC 100 (H) 08/28/2023    Current HIV Regimen: Dovato   TARGET DATE: Today - the 8th  Assessment: Alex Mitchell presents today for his first initiation  injection for Cabenuva . Counseled that Cabenuva  is two separate intramuscular injections in the gluteal muscle on each side for each visit. Explained that the second injection is 30 days after the initial injection then every 2 months thereafter. Discussed the rare but significant chance of developing resistance despite compliance. Explained that showing up to injection appointments is very important and warned that if 2 appointments are missed, it will be reassessed by their provider whether they are a good candidate for injection therapy. Counseled on possible side effects associated with the injections such as injection site pain, which is  usually mild to moderate in nature, injection site nodules, and injection site reactions. Asked to call the clinic or send me a mychart message if they experience any issues, such as fatigue, nausea, headache, rash, or dizziness. Advised that they can take ibuprofen or tylenol  for injection site pain if needed.   Administered cabotegravir  600mg /2mL in left upper outer quadrant of the gluteal muscle. Administered rilpivirine  900 mg/3mL in the right upper outer quadrant of the gluteal muscle. Monitored patient for 10 minutes after injection. Injections were tolerated well without issue. Counseled to stop taking Dovato  after today's dose and to call with any issues that may arise. Will make follow up appointments for second initiation injection in 30 days and then maintenance injections every 2 months thereafter.   Plan: - Stop Dovato  after today's dose - First Cabenuva  injections administered - Second initiation injection scheduled for *** - Maintenance injections scheduled for *** - Call with any issues or questions  Alex Mitchell L. Ruthene Methvin, PharmD, BCIDP, AAHIVP, CPP Clinical Pharmacist Practitioner Infectious Diseases Clinical Pharmacist [facility]

## 2023-11-27 ENCOUNTER — Ambulatory Visit: Payer: 59 | Admitting: Pharmacist

## 2023-12-04 ENCOUNTER — Ambulatory Visit: Payer: 59 | Admitting: Pharmacist

## 2023-12-04 ENCOUNTER — Ambulatory Visit: Payer: Self-pay | Admitting: Nurse Practitioner

## 2023-12-10 ENCOUNTER — Encounter: Payer: Self-pay | Admitting: Pharmacist

## 2023-12-11 ENCOUNTER — Ambulatory Visit (INDEPENDENT_AMBULATORY_CARE_PROVIDER_SITE_OTHER): Payer: 59 | Admitting: Nurse Practitioner

## 2023-12-11 ENCOUNTER — Encounter: Payer: Self-pay | Admitting: Nurse Practitioner

## 2023-12-11 VITALS — BP 125/68 | HR 64 | Temp 80.0°F | Ht 68.0 in | Wt 239.0 lb

## 2023-12-11 DIAGNOSIS — N1831 Chronic kidney disease, stage 3a: Secondary | ICD-10-CM

## 2023-12-11 DIAGNOSIS — E782 Mixed hyperlipidemia: Secondary | ICD-10-CM

## 2023-12-11 DIAGNOSIS — B20 Human immunodeficiency virus [HIV] disease: Secondary | ICD-10-CM | POA: Diagnosis not present

## 2023-12-11 DIAGNOSIS — Z6837 Body mass index (BMI) 37.0-37.9, adult: Secondary | ICD-10-CM

## 2023-12-11 MED ORDER — DAPAGLIFLOZIN PROPANEDIOL 10 MG PO TABS: 10.0000 mg | ORAL_TABLET | Freq: Every day | ORAL | 1 refills | Status: DC

## 2023-12-11 NOTE — Patient Instructions (Addendum)
1. BMI 37.0-37.9, adult (Primary)  - Amb Ref to Medical Weight Management   [phone]  2. Stage 3a chronic kidney disease (HCC)  - CMP14+EGFR - dapagliflozin propanediol (FARXIGA) 10 MG TABS tablet; Take 1 tablet (10 mg total) by mouth daily before breakfast.  Dispense: 90 tablet; Refill: 1     It is important that you exercise regularly at least 30 minutes 5 times a week as tolerated  Think about what you will eat, plan ahead. Choose " clean, green, fresh or frozen" over canned, processed or packaged foods which are more sugary, salty and fatty. 70 to 75% of food eaten should be vegetables and fruit. Three meals at set times with snacks allowed between meals, but they must be fruit or vegetables. Aim to eat over a 12 hour period , example 7 am to 7 pm, and STOP after  your last meal of the day. Drink water,generally about 64 ounces per day, no other drink is as healthy. Fruit juice is best enjoyed in a healthy way, by EATING the fruit.  Thanks for choosing [facility] we consider it a privelige to serve you.

## 2023-12-11 NOTE — Assessment & Plan Note (Addendum)
Followed by the infectious disease specialist Patient encouraged to maintain close follow-up with them On Dovato daily

## 2023-12-11 NOTE — Assessment & Plan Note (Addendum)
Wt Readings from Last 3 Encounters:  12/11/23 239 lb (108.4 kg)  11/08/23 235 lb (106.6 kg)  08/28/23 239 lb (108.4 kg)  Body mass index is 36.34 kg/m.   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  He has now signed up for the gym and he is interested in referral to the medical weight management clinic, stated that his friend goes to the medical weight management clinic and he has had some positive results with his weight management.  Patient counseled on low-carb diet Encouraged to engage in regular moderate to vigorous exercises at least 150 minutes weekly Phentermine discontinued has been on phentermine for about 6 months Could consider a GLP-1 in the future

## 2023-12-11 NOTE — Progress Notes (Signed)
Established Patient Office Visit  Subjective:  Patient ID: Alex Mitchell, male    DOB: [birth year]  Age: 43 y.o. MRN: [id number]  CC:  Chief Complaint  Patient presents with   Obesity    HPI Alex Mitchell is a 43 y.o. male  has a past medical history of AIDS (acquired immune deficiency syndrome) (HCC) (10/31/2018), [place], Elevated serum creatinine, Encounter for immunization (08/28/2023), Hyperlipidemia, Mouth sores, Obesity, Prediabetes, Seasonal allergies (09/30/2019), and Stage 3a chronic kidney disease (HCC) (05/22/2023).  Patient presents for follow-up for his chronic medical conditions Patient denies any adverse reactions to current medications Please see assessment and plan section for full HPI    Past Medical History:  Diagnosis Date   AIDS (acquired immune deficiency syndrome) (HCC) 10/31/2018   [place]    Elevated serum creatinine    Encounter for immunization 08/28/2023   Hyperlipidemia    Mouth sores    Obesity    Prediabetes    Seasonal allergies 09/30/2019   Stage 3a chronic kidney disease (HCC) 05/22/2023    History reviewed. No pertinent surgical history.  Family History  Problem Relation Age of Onset   Hypertension Father    Hypertension Other     Social History   Socioeconomic History   Marital status: Single    Spouse name: Not on file   Number of children: Not on file   Years of education: Not on file   Highest education level: Not on file  Occupational History   Not on file  Tobacco Use   Smoking status: Never   Smokeless tobacco: Never  Vaping Use   Vaping status: Never Used  Substance and Sexual Activity   Alcohol use: Not Currently   Drug use: No   Sexual activity: Not Currently  Other Topics Concern   Not on file  Social History Narrative   Lives with his room mate    Social Drivers of Health   Financial Resource Strain: Not on file  Food Insecurity: No Food Insecurity (12/11/2023)   Hunger Vital Sign    Worried About  Running Out of Food in the Last Year: Never true    Ran Out of Food in the Last Year: Never true  Transportation Needs: No Transportation Needs (12/11/2023)   PRAPARE - Administrator, Civil Service (Medical): No    Lack of Transportation (Non-Medical): No  Physical Activity: Not on file  Stress: Not on file  Social Connections: Unknown (03/22/2022)   [facility] from [facility]   Social Network    Social Network: Not on file  Intimate Partner Violence: Not At Risk (12/11/2023)   Humiliation, Afraid, Rape, and Kick questionnaire    Fear of Current or Ex-Partner: No    Emotionally Abused: No    Physically Abused: No    Sexually Abused: No    Outpatient Medications Prior to Visit  Medication Sig Dispense Refill   albuterol (VENTOLIN HFA) 108 (90 Base) MCG/ACT inhaler Inhale 1-2 puffs into the lungs every 6 (six) hours as needed. 8 g 0   atorvastatin (LIPITOR) 20 MG tablet TAKE 1 TABLET BY MOUTH EVERY DAY 90 tablet 1   cabotegravir & rilpivirine ER (CABENUVA) 600 & 900 MG/3ML injection Inject 1 kit into the muscle every 30 (thirty) days. 6 mL 1   cetirizine (ZYRTEC) 10 MG tablet Take 1 tablet (10 mg total) by mouth daily. 30 tablet 11   Cranberry 250 MG CHEW Chew by mouth.     fluticasone (FLONASE)  50 MCG/ACT nasal spray Place 2 sprays into both nostrils daily. 16 g 0   phentermine 37.5 MG capsule Take 1 capsule (37.5 mg total) by mouth every morning. 30 capsule 2   dolutegravir-lamiVUDine (DOVATO) 50-300 MG tablet Take 1 tablet by mouth daily. (Patient not taking: Reported on 12/11/2023) 30 tablet 5   doxycycline (VIBRA-TABS) 100 MG tablet Take 2 tablets (200 mg total) by mouth See admin instructions. Within 24-72 hours of unprotected oral or genital sex. (Patient not taking: Reported on 12/11/2023) 20 tablet 1   benzonatate (TESSALON) 100 MG capsule Take 1 capsule (100 mg total) by mouth 3 (three) times daily as needed for cough. (Patient not taking: Reported on  11/08/2023) 30 capsule 0   dapagliflozin propanediol (FARXIGA) 10 MG TABS tablet Take 1 tablet (10 mg total) by mouth daily before breakfast. (Patient not taking: Reported on 12/11/2023) 30 tablet 3   No facility-administered medications prior to visit.    No Known Allergies  ROS Review of Systems  Constitutional:  Negative for appetite change, chills, fatigue and fever.  HENT:  Negative for congestion, postnasal drip, rhinorrhea and sneezing.   Respiratory:  Negative for cough, shortness of breath and wheezing.   Cardiovascular:  Negative for chest pain, palpitations and leg swelling.  Gastrointestinal:  Negative for abdominal pain, constipation, nausea and vomiting.  Genitourinary:  Negative for difficulty urinating, dysuria, flank pain and frequency.  Musculoskeletal:  Negative for arthralgias, back pain, joint swelling and myalgias.  Skin:  Negative for color change, pallor, rash and wound.  Neurological:  Negative for dizziness, facial asymmetry, weakness, numbness and headaches.  Psychiatric/Behavioral:  Negative for behavioral problems, confusion, self-injury and suicidal ideas.       Objective:    Physical Exam Vitals and nursing note reviewed.  Constitutional:      General: He is not in acute distress.    Appearance: Normal appearance. He is obese. He is not ill-appearing, toxic-appearing or diaphoretic.  HENT:     Mouth/Throat:     Mouth: Mucous membranes are moist.     Pharynx: Oropharynx is clear. No oropharyngeal exudate or posterior oropharyngeal erythema.  Eyes:     General: No scleral icterus.       Right eye: No discharge.        Left eye: No discharge.     Extraocular Movements: Extraocular movements intact.     Conjunctiva/sclera: Conjunctivae normal.  Cardiovascular:     Rate and Rhythm: Normal rate and regular rhythm.     Pulses: Normal pulses.     Heart sounds: Normal heart sounds. No murmur heard.    No friction rub. No gallop.  Pulmonary:      Effort: Pulmonary effort is normal. No respiratory distress.     Breath sounds: Normal breath sounds. No stridor. No wheezing, rhonchi or rales.  Chest:     Chest wall: No tenderness.  Abdominal:     General: There is no distension.     Palpations: Abdomen is soft.     Tenderness: There is no abdominal tenderness. There is no right CVA tenderness, left CVA tenderness or guarding.  Musculoskeletal:        General: No swelling, tenderness, deformity or signs of injury.     Right lower leg: No edema.     Left lower leg: No edema.  Skin:    General: Skin is warm and dry.     Capillary Refill: Capillary refill takes less than 2 seconds.     Coloration:  Skin is not jaundiced or pale.     Findings: No bruising, erythema or lesion.  Neurological:     Mental Status: He is alert and oriented to person, place, and time.     Motor: No weakness.     Coordination: Coordination normal.     Gait: Gait normal.  Psychiatric:        Mood and Affect: Mood normal.        Behavior: Behavior normal.        Thought Content: Thought content normal.        Judgment: Judgment normal.     BP 125/68   Pulse 64   Temp (!) 80 F (26.7 C)   Ht 5\' 8"  (1.727 m)   Wt 239 lb (108.4 kg)   SpO2 100%   BMI 36.34 kg/m  Wt Readings from Last 3 Encounters:  12/11/23 239 lb (108.4 kg)  11/08/23 235 lb (106.6 kg)  08/28/23 239 lb (108.4 kg)    Lab Results  Component Value Date   TSH 1.540 09/05/2022   Lab Results  Component Value Date   WBC 6.3 10/23/2023   HGB 13.5 10/23/2023   HCT 39.6 10/23/2023   MCV 97.5 10/23/2023   PLT 206 10/23/2023   Lab Results  Component Value Date   NA 137 10/23/2023   K 4.4 10/23/2023   CO2 28 10/23/2023   GLUCOSE 95 10/23/2023   BUN 17 10/23/2023   CREATININE 1.82 (H) 10/23/2023   BILITOT 0.6 10/23/2023   ALKPHOS 54 04/24/2023   AST 21 10/23/2023   ALT 13 10/23/2023   PROT 7.2 10/23/2023   ALBUMIN 4.8 04/24/2023   CALCIUM 9.8 10/23/2023   ANIONGAP 11  10/29/2018   EGFR 47 (L) 10/23/2023   Lab Results  Component Value Date   CHOL 159 08/28/2023   Lab Results  Component Value Date   HDL 48 08/28/2023   Lab Results  Component Value Date   LDLCALC 100 (H) 08/28/2023   Lab Results  Component Value Date   TRIG 53 08/28/2023   Lab Results  Component Value Date   CHOLHDL 3.3 08/28/2023   Lab Results  Component Value Date   HGBA1C 5.8 (H) 10/24/2022      Assessment & Plan:   Problem List Items Addressed This Visit       Genitourinary   Stage 3a chronic kidney disease (HCC)   Lab Results  Component Value Date   NA 137 10/23/2023   K 4.4 10/23/2023   CO2 28 10/23/2023   GLUCOSE 95 10/23/2023   BUN 17 10/23/2023   CREATININE 1.82 (H) 10/23/2023   CALCIUM 9.8 10/23/2023   EGFR 47 (L) 10/23/2023   GFRNONAA 48 (L) 12/07/2019  Still not taking Farxiga Patient encouraged to consider taking the medication to slow down the progression of his CKD Encouraged to drink at least 64 ounces of water daily to maintain hydration avoid Aleve, ibuprofen      Relevant Medications   dapagliflozin propanediol (FARXIGA) 10 MG TABS tablet   Other Relevant Orders   CMP14+EGFR     Other   HIV disease (HCC) (Chronic)   Followed by the infectious disease specialist Patient encouraged to maintain close follow-up with them On Dovato daily      BMI 37.0-37.9, adult - Primary   Wt Readings from Last 3 Encounters:  12/11/23 239 lb (108.4 kg)  11/08/23 235 lb (106.6 kg)  08/28/23 239 lb (108.4 kg)  Body mass index is 36.34 kg/m.  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  He has now signed up for the gym and he is interested in referral to the medical weight management clinic, stated that his friend goes to the medical weight management  clinic and he has had some positive results with his weight management.  Patient counseled on low-carb diet Encouraged to engage in regular moderate to vigorous exercises at least 150 minutes weekly Phentermine discontinued has been on phentermine for about 6 months Could consider a GLP-1 in the future         Relevant Orders   Amb Ref to Medical Weight Management   Hyperlipidemia   Lab Results  Component Value Date   CHOL 159 08/28/2023   HDL 48 08/28/2023   LDLCALC 100 (H) 08/28/2023   TRIG 53 08/28/2023   CHOLHDL 3.3 08/28/2023  Continue atorvastatin 20 mg daily Avoid fatty fried foods We will recheck labs at next visit       Meds ordered this encounter  Medications   DISCONTD: dapagliflozin propanediol (FARXIGA) 10 MG TABS tablet    Sig: Take 1 tablet (10 mg total) by mouth daily before breakfast.    Dispense:  90 tablet    Refill:  1   dapagliflozin propanediol (FARXIGA) 10 MG TABS tablet    Sig: Take 1 tablet (10 mg total) by mouth daily before breakfast.    Dispense:  90 tablet    Refill:  1    Follow-up: Return in about 4 months (around 04/09/2024) for CPE.    Donell Beers, FNP

## 2023-12-11 NOTE — Assessment & Plan Note (Signed)
Lab Results  Component Value Date   NA 137 10/23/2023   K 4.4 10/23/2023   CO2 28 10/23/2023   GLUCOSE 95 10/23/2023   BUN 17 10/23/2023   CREATININE 1.82 (H) 10/23/2023   CALCIUM 9.8 10/23/2023   EGFR 47 (L) 10/23/2023   GFRNONAA 48 (L) 12/07/2019  Still not taking Farxiga Patient encouraged to consider taking the medication to slow down the progression of his CKD Encouraged to drink at least 64 ounces of water daily to maintain hydration avoid Aleve, ibuprofen

## 2023-12-11 NOTE — Assessment & Plan Note (Signed)
Lab Results  Component Value Date   CHOL 159 08/28/2023   HDL 48 08/28/2023   LDLCALC 100 (H) 08/28/2023   TRIG 53 08/28/2023   CHOLHDL 3.3 08/28/2023  Continue atorvastatin 20 mg daily Avoid fatty fried foods We will recheck labs at next visit

## 2023-12-12 LAB — CMP14+EGFR
ALT: 16 [IU]/L (ref 0–44)
AST: 26 [IU]/L (ref 0–40)
Albumin: 4.4 g/dL (ref 4.1–5.1)
Alkaline Phosphatase: 49 [IU]/L (ref 44–121)
BUN/Creatinine Ratio: 9 (ref 9–20)
BUN: 17 mg/dL (ref 6–24)
Bilirubin Total: 0.2 mg/dL (ref 0.0–1.2)
CO2: 22 mmol/L (ref 20–29)
Calcium: 9.3 mg/dL (ref 8.7–10.2)
Chloride: 104 mmol/L (ref 96–106)
Creatinine, Ser: 1.8 mg/dL — ABNORMAL HIGH (ref 0.76–1.27)
Globulin, Total: 2.3 g/dL (ref 1.5–4.5)
Glucose: 96 mg/dL (ref 70–99)
Potassium: 5 mmol/L (ref 3.5–5.2)
Sodium: 140 mmol/L (ref 134–144)
Total Protein: 6.7 g/dL (ref 6.0–8.5)
eGFR: 48 mL/min/{1.73_m2} — ABNORMAL LOW (ref >59)

## 2023-12-16 ENCOUNTER — Telehealth: Payer: Self-pay | Admitting: Pharmacist

## 2023-12-16 ENCOUNTER — Other Ambulatory Visit: Payer: Self-pay | Admitting: Nurse Practitioner

## 2023-12-16 ENCOUNTER — Telehealth: Payer: Self-pay | Admitting: Nurse Practitioner

## 2023-12-16 DIAGNOSIS — N1831 Chronic kidney disease, stage 3a: Secondary | ICD-10-CM

## 2023-12-16 NOTE — Telephone Encounter (Signed)
Patient initially scheduled to start Cabenuva beginning of January. Rescheduled appointment later in January and then no showed. Sent patient a MyChart which he has read (on 1/21) and has not responded. Will defer scheduling new Cabenuva appointment until patient reaches out to [place].   He has signed the [employer] and understands the importance of timely appointments and open communication with [place].  Margarite Gouge, PharmD, CPP, BCIDP, AAHIVP Clinical Pharmacist Practitioner Infectious Diseases Clinical Pharmacist [facility]

## 2023-12-16 NOTE — Telephone Encounter (Signed)
Sent to provider. KH

## 2023-12-16 NOTE — Telephone Encounter (Signed)
Copied from CRM [id number]. Topic: Clinical - Prescription Issue >> Dec 16, 2023  1:41 PM Ivette P wrote: Reason for CRM: Pt  called in because he is try to  to pick up his prescription dapagliflozin propanediol (FARXIGA) 10 MG TABS tablet and thye are asking  him to pay $700 and pt is requesting another cheaper medication. Callback number [phone]

## 2023-12-18 ENCOUNTER — Encounter (INDEPENDENT_AMBULATORY_CARE_PROVIDER_SITE_OTHER): Payer: Self-pay

## 2023-12-18 ENCOUNTER — Other Ambulatory Visit: Payer: Self-pay

## 2023-12-19 ENCOUNTER — Telehealth: Payer: Self-pay

## 2023-12-19 NOTE — Progress Notes (Signed)
   12/19/2023  Patient ID: Alex Mitchell, male   DOB: Jun 15, 1981, 43 y.o.   MRN: [id number]  Attempted to contact patient for medication assistance. Left HIPAA compliant message for patient to return my call at their convenience.   Harlon Flor, PharmD Clinical Pharmacist  [phone]

## 2023-12-23 ENCOUNTER — Ambulatory Visit (INDEPENDENT_AMBULATORY_CARE_PROVIDER_SITE_OTHER): Payer: 59 | Admitting: Family Medicine

## 2023-12-23 ENCOUNTER — Other Ambulatory Visit: Payer: Self-pay

## 2023-12-23 VITALS — BP 122/84 | HR 58 | Ht 68.0 in | Wt 244.0 lb

## 2023-12-23 DIAGNOSIS — M25462 Effusion, left knee: Secondary | ICD-10-CM | POA: Diagnosis not present

## 2023-12-23 DIAGNOSIS — G8929 Other chronic pain: Secondary | ICD-10-CM

## 2023-12-23 DIAGNOSIS — M25562 Pain in left knee: Secondary | ICD-10-CM | POA: Diagnosis not present

## 2023-12-23 DIAGNOSIS — N1831 Chronic kidney disease, stage 3a: Secondary | ICD-10-CM

## 2023-12-23 NOTE — Patient Instructions (Signed)
Thank you for coming in today.   You received an injection today. Seek immediate medical attention if the joint becomes red, extremely painful, or is oozing fluid.   Please use Voltaren gel (Generic Diclofenac Gel) up to 4x daily for pain as needed.  This is available over-the-counter as both the name brand Voltaren gel and the generic diclofenac gel.   I recommend you obtained a compression sleeve to help with your joint problems. There are many options on the market however I recommend obtaining a Full Knee Body Helix compression sleeve.  You can find information (including how to appropriate measure yourself for sizing) can be found at www.[email or web address].ch.  Many of these products are health savings account (HSA) eligible.   You can use the compression sleeve at any time throughout the day but is most important to use while being active as well as for 2 hours post-activity.   It is appropriate to ice following activity with the compression sleeve in place.

## 2023-12-23 NOTE — Progress Notes (Signed)
   Alex Payor, PhD, LAT, ATC acting as a scribe for Alex Graham, MD.  Alex Mitchell is a 43 y.o. male who presents to [facility] at [facility] today for exacerbation of his L knee pain. Pt was last seen by Dr. Katrinka Blazing on 08/14/23 and last aspiration/injection was on 06/12/23.  Today, pt reports L knee pain started to return over the last 2wks. He notes feeling a sense of "tightness." He was unable to work on Saturday and today. He works as a Civil Service fast streamer for [employer].  Dx imaging: 12/19/22 L knee XR  Pertinent review of systems: No fevers or chills  Relevant historical information: Controlled HIV.  CKD. Patient works for [employer].  Exam:  BP 122/84   Pulse (!) 58   Ht 5\' 8"  (1.727 m)   Wt 244 lb (110.7 kg)   SpO2 98%   BMI 37.10 kg/m  General: Well Developed, well nourished, and in no acute distress.   MSK: Left knee moderate effusion normal motion with crepitation.    Lab and Radiology Results  Procedure: Real-time Ultrasound Guided Injection of left knee joint superior lateral patella space Device: Philips Affiniti 50G/GE Logiq Images permanently stored and available for review in PACS Verbal informed consent obtained.  Discussed risks and benefits of procedure. Warned about infection, bleeding, hyperglycemia damage to structures among others. Patient expresses understanding and agreement Time-out conducted.   Noted no overlying erythema, induration, or other signs of local infection.   Skin prepped in a sterile fashion.   Local anesthesia: Topical Ethyl chloride.   With sterile technique and under real time ultrasound guidance: 40 mg of Kenalog and 2 mL of Marcaine injected into knee joint. Fluid seen entering the joint capsule.   Completed without difficulty   Pain immediately resolved suggesting accurate placement of the medication.   Advised to call if fevers/chills, erythema, induration, drainage, or persistent bleeding.   Images permanently stored  and available for review in the ultrasound unit.  Impression: Technically successful ultrasound guided injection.       Assessment and Plan: 43 y.o. male with chronic left knee pain with an acute exacerbation.  X-ray obtained about a year ago showed only mild degenerative changes.  He does get these recurrent knee effusions and pain.  I think with advanced imaging we would see more arthritis that is visible on x-ray.  Plan today for steroid injection which has been helpful in the past.  Recommend compression knee sleeve and Voltaren gel as well.  Tylenol orally is okay however he should avoid high-dose oral NSAIDs given his CKD.  Check back as needed.   PDMP not reviewed this encounter. Orders Placed This Encounter  Procedures   [place] LIMITED JOINT SPACE STRUCTURES LOW LEFT(NO LINKED CHARGES)    Reason for Exam (SYMPTOM  OR DIAGNOSIS REQUIRED):   left knee pain    Preferred imaging location?:   [facility]   No orders of the defined types were placed in this encounter.    Discussed warning signs or symptoms. Please see discharge instructions. Patient expresses understanding.   The above documentation has been reviewed and is accurate and complete Alex Mitchell, M.D.

## 2023-12-24 ENCOUNTER — Other Ambulatory Visit: Payer: Self-pay

## 2023-12-24 DIAGNOSIS — Z5986 Financial insecurity: Secondary | ICD-10-CM

## 2023-12-24 DIAGNOSIS — Z0289 Encounter for other administrative examinations: Secondary | ICD-10-CM

## 2023-12-24 NOTE — Progress Notes (Addendum)
   12/24/2023  Patient ID: Alex Mitchell, male   DOB: [birth year], 43 y.o.   MRN: [id number]  Attempted to contact patient for medication assistance. Left HIPAA compliant message for patient to return my call at their convenience. Spoke to pharmacy, Farxiga  requires Prior Authorization (PA). Burnard Lot, CPhT is working on the [place].  Heather Factor, PharmD Clinical Pharmacist  [phone]

## 2023-12-24 NOTE — Progress Notes (Signed)
 12/24/2023 Name: Alex Mitchell MRN: [id number] DOB: [birth year]  Chief Complaint  Patient presents with   Medication Assistance    Farxiga     Alex Mitchell is a 43 y.o. year old male who presented for a telephone visit.   They were referred to the pharmacist by their PCP for assistance in managing medication access.    Subjective: Patient reported cannot afford Farxiga . Called pharmacy, was informed the medication needed a prior authorization.  Medication Access/Adherence  Current Pharmacy:  [id number] [address], [place] - [address] [facility] [address] [place] [address] Phone: [phone] Fax: [phone]   Patient reports affordability concerns with their medications: Yes  Patient reports access/transportation concerns to their pharmacy: No  Patient reports adherence concerns with their medications:  No       Objective:  Lab Results  Component Value Date   HGBA1C 5.8 (H) 10/24/2022    Lab Results  Component Value Date   CREATININE 1.80 (H) 12/11/2023   BUN 17 12/11/2023   NA 140 12/11/2023   K 5.0 12/11/2023   CL 104 12/11/2023   CO2 22 12/11/2023    Lab Results  Component Value Date   CHOL 159 08/28/2023   HDL 48 08/28/2023   LDLCALC 100 (H) 08/28/2023   TRIG 53 08/28/2023   CHOLHDL 3.3 08/28/2023    Medications Reviewed Today     Reviewed by Graylon Keen, [facility] (Pharmacist) on 12/24/23 at 1721  Med List Status: <None>   Medication Order Taking? Sig Documenting Provider Last Dose Status Informant  albuterol  (VENTOLIN  HFA) 108 (90 Base) MCG/ACT inhaler [id number] No Inhale 1-2 puffs into the lungs every 6 (six) hours as needed.  Patient not taking: Reported on 12/24/2023   Vivienne Delon HERO, PA-C Not Taking Active   atorvastatin  (LIPITOR) 20 MG tablet [id number] Yes TAKE 1 TABLET BY MOUTH EVERY DAY Paseda, Folashade R, FNP Taking Active   cabotegravir  & rilpivirine  ER (CABENUVA ) 600 & 900 MG/3ML injection  [id number] Yes Inject 1 kit into the muscle every 30 (thirty) days. Waddell Alan PARAS, RPH-CPP Taking Active            Med Note DELORAS, KEEN D   Tue Dec 24, 2023  5:14 PM) Pt will call to reschedule first dose  cetirizine  (ZYRTEC ) 10 MG tablet [id number] Yes Take 1 tablet (10 mg total) by mouth daily. Paseda, Folashade R, FNP Taking Active            Med Note DELORAS KEEN D   Tue Dec 24, 2023  5:15 PM) Pt taking as needed  Cranberry 250 MG CHEW [id number] No Chew by mouth.  Patient not taking: Reported on 12/24/2023   [provider] Not Taking Active   dapagliflozin  propanediol (FARXIGA ) 10 MG TABS tablet [id number] Yes Take 1 tablet (10 mg total) by mouth daily before breakfast. Paseda, Folashade R, FNP Taking Active   dolutegravir -lamiVUDine  (DOVATO ) 50-300 MG tablet [id number] Yes Take 1 tablet by mouth daily. Melvenia Corean SAILOR, NP Taking Active            Med Note DELORAS, KEEN D   Tue Dec 24, 2023  5:19 PM) To be replaced by Cabenuva   doxycycline  (VIBRA -TABS) 100 MG tablet [id number] Yes Take 2 tablets (200 mg total) by mouth See admin instructions. Within 24-72 hours of unprotected oral or genital sex. Melvenia Corean SAILOR, NP Taking Active   fluticasone  (FLONASE ) 50 MCG/ACT nasal spray [id number] No Place 2 sprays into  both nostrils daily.  Patient not taking: Reported on 12/24/2023   Gladis Elsie BROCKS, PA-C Not Taking Active               Assessment/Plan:   I reviewed the patient's medications with him and [facility] ensure [facility] was properly billed and ready for pickup. I updated the patient that is was successful. The patient will follow up with his insurance plan to clarify what's needed during grace period.  Follow Up Plan: I will follow up with the patient in 2 weeks to ensure we will be able to process his refills appropriately.   Heather Factor, PharmD Clinical Pharmacist  [phone]

## 2023-12-25 ENCOUNTER — Ambulatory Visit (INDEPENDENT_AMBULATORY_CARE_PROVIDER_SITE_OTHER): Payer: 59 | Admitting: Nurse Practitioner

## 2023-12-25 VITALS — BP 138/71 | HR 64 | Temp 98.1°F | Ht 68.0 in | Wt 241.0 lb

## 2023-12-25 DIAGNOSIS — E7849 Other hyperlipidemia: Secondary | ICD-10-CM

## 2023-12-25 DIAGNOSIS — R7303 Prediabetes: Secondary | ICD-10-CM | POA: Diagnosis not present

## 2023-12-25 DIAGNOSIS — E669 Obesity, unspecified: Secondary | ICD-10-CM

## 2023-12-25 DIAGNOSIS — Z6836 Body mass index (BMI) 36.0-36.9, adult: Secondary | ICD-10-CM

## 2023-12-25 NOTE — Progress Notes (Signed)
 Office: [phone]  /  Fax: [phone]   Initial Visit  Alex Mitchell was seen in clinic today to evaluate for obesity. He is interested in losing weight to improve overall health and reduce the risk of weight related complications. He presents today to review program treatment options, initial physical assessment, and evaluation.     He was referred by: PCP  When asked what else they would like to accomplish? He states: Adopt healthier eating patterns, Improve energy levels and physical activity, Improve quality of life, Improve appearance, and Improve self-confidence   When asked how has your weight affected you? He states: Contributed to medical problems, Contributed to orthopedic problems or mobility issues, Having fatigue, and Having poor endurance  Some associated conditions: neuropathy, CKD, HIV, iron def anemia, pre diabetes, allergies, HLD  Contributing factors: Family history of obesity and Eating patterns  Weight promoting medications identified: None  Current nutrition plan: None  Current level of physical activity: He started going to the gym yesterday-treadmill and some resistance training  Current or previous pharmacotherapy: Phentermine -took for 6 months and stopped 2 weeks ago.   Response to medication: Lost weight and was able to maintain weight loss   Past medical history includes:   Past Medical History:  Diagnosis Date   AIDS (acquired immune deficiency syndrome) (HCC) 10/31/2018   [place]    Elevated serum creatinine    Encounter for immunization 08/28/2023   Hyperlipidemia    Mouth sores    Obesity    Prediabetes    Seasonal allergies 09/30/2019   Stage 3a chronic kidney disease (HCC) 05/22/2023     Objective:   BP 138/71   Pulse 64   Temp 98.1 F (36.7 C)   Ht 5' 8 (1.727 m)   Wt 241 lb (109.3 kg)   SpO2 100%   BMI 36.64 kg/m  He was weighed on the bioimpedance scale: Body mass index is 36.64 kg/m.  Peak Weight:284 lbs , Body  Fat%:30.3, Visceral Fat Rating:16, Weight trend over the last 12 months: Decreasing  General:  Alert, oriented and cooperative. Patient is in no acute distress.  Respiratory: Normal respiratory effort, no problems with respiration noted   Gait: able to ambulate independently  Mental Status: Normal mood and affect. Normal behavior. Normal judgment and thought content.   DIAGNOSTIC DATA REVIEWED:  BMET    Component Value Date/Time   NA 140 12/11/2023 0929   K 5.0 12/11/2023 0929   CL 104 12/11/2023 0929   CO2 22 12/11/2023 0929   GLUCOSE 96 12/11/2023 0929   GLUCOSE 95 10/23/2023 0923   BUN 17 12/11/2023 0929   CREATININE 1.80 (H) 12/11/2023 0929   CREATININE 1.82 (H) 10/23/2023 0923   CALCIUM  9.3 12/11/2023 0929   GFRNONAA 48 (L) 12/07/2019 0949   GFRAA 55 (L) 12/07/2019 0949   Lab Results  Component Value Date   HGBA1C 5.8 (H) 10/24/2022   HGBA1C 5.4 11/05/2018   No results found for: INSULIN  CBC    Component Value Date/Time   WBC 6.3 10/23/2023 0923   RBC 4.06 (L) 10/23/2023 0923   HGB 13.5 10/23/2023 0923   HGB 12.7 (L) 05/01/2023 0951   HCT 39.6 10/23/2023 0923   HCT 38.2 05/01/2023 0951   PLT 206 10/23/2023 0923   PLT 211 05/01/2023 0951   MCV 97.5 10/23/2023 0923   MCV 97 05/01/2023 0951   MCH 33.3 (H) 10/23/2023 0923   MCHC 34.1 10/23/2023 0923   RDW 13.1 10/23/2023 0923   RDW 13.3  05/01/2023 0951   Iron/TIBC/Ferritin/ %Sat    Component Value Date/Time   IRON 61 07/26/2021 1144   TIBC 325 07/26/2021 1144   FERRITIN 264 07/26/2021 1144   IRONPCTSAT 19 07/26/2021 1144   IRONPCTSAT 19 (L) 08/13/2019 1031   Lipid Panel     Component Value Date/Time   CHOL 159 08/28/2023 0933   TRIG 53 08/28/2023 0933   HDL 48 08/28/2023 0933   CHOLHDL 3.3 08/28/2023 0933   CHOLHDL 4.5 05/01/2023 0951   LDLCALC 100 (H) 08/28/2023 0933   LDLCALC 161 (H) 05/01/2023 0951   Hepatic Function Panel     Component Value Date/Time   PROT 6.7 12/11/2023 0929   ALBUMIN  4.4 12/11/2023 0929   AST 26 12/11/2023 0929   ALT 16 12/11/2023 0929   ALKPHOS 49 12/11/2023 0929   BILITOT 0.2 12/11/2023 0929   BILIDIR 0.1 10/24/2022 1008   IBILI 0.5 10/24/2022 1008      Component Value Date/Time   TSH 1.540 09/05/2022 1143     Assessment and Plan:   Prediabetes Will continue to monitor  Other hyperlipidemia Will continue to monitor.   Generalized obesity  BMI 36.0-36.9,adult        Obesity Treatment / Action Plan:  Patient will work on garnering support from family and friends to begin weight loss journey. Will work on eliminating or reducing the presence of highly palatable, calorie dense foods in the home. Will complete provided nutritional and psychosocial assessment questionnaire before the next appointment. Will be scheduled for indirect calorimetry to determine resting energy expenditure in a fasting state.  This will allow us  to create a reduced calorie, high-protein meal plan to promote loss of fat mass while preserving muscle mass. Counseled on the health benefits of losing 5%-15% of total body weight. Was counseled on nutritional approaches to weight loss and benefits of reducing processed foods and consuming plant-based foods and high quality protein as part of nutritional weight management. Was counseled on pharmacotherapy and role as an adjunct in weight management.   Obesity Education Performed Today:  He was weighed on the bioimpedance scale and results were discussed and documented in the synopsis.  We discussed obesity as a disease and the importance of a more detailed evaluation of all the factors contributing to the disease.  We discussed the importance of long term lifestyle changes which include nutrition, exercise and behavioral modifications as well as the importance of customizing this to his specific health and social needs.  We discussed the benefits of reaching a healthier weight to alleviate the symptoms of existing  conditions and reduce the risks of the biomechanical, metabolic and psychological effects of obesity.  Alex Mitchell appears to be in the action stage of change and states they are ready to start intensive lifestyle modifications and behavioral modifications.  30 minutes was spent today on this visit including the above counseling, pre-visit chart review, and post-visit documentation.  Reviewed by clinician on day of visit: allergies, medications, problem list, medical history, surgical history, family history, social history, and previous encounter notes pertinent to obesity diagnosis.    Corean SAUNDERS Derrico Zhong FNP-C

## 2023-12-30 ENCOUNTER — Other Ambulatory Visit: Payer: Self-pay

## 2023-12-30 NOTE — Progress Notes (Signed)
Patient never came to appt.

## 2024-01-08 ENCOUNTER — Ambulatory Visit (INDEPENDENT_AMBULATORY_CARE_PROVIDER_SITE_OTHER): Payer: 59 | Admitting: Bariatrics

## 2024-01-08 ENCOUNTER — Encounter: Payer: Self-pay | Admitting: Bariatrics

## 2024-01-08 ENCOUNTER — Ambulatory Visit: Payer: 59 | Admitting: Family Medicine

## 2024-01-08 VITALS — BP 133/80 | HR 60 | Temp 98.0°F | Ht 68.0 in | Wt 242.0 lb

## 2024-01-08 DIAGNOSIS — Z1331 Encounter for screening for depression: Secondary | ICD-10-CM | POA: Diagnosis not present

## 2024-01-08 DIAGNOSIS — R7303 Prediabetes: Secondary | ICD-10-CM | POA: Diagnosis not present

## 2024-01-08 DIAGNOSIS — E785 Hyperlipidemia, unspecified: Secondary | ICD-10-CM

## 2024-01-08 DIAGNOSIS — D508 Other iron deficiency anemias: Secondary | ICD-10-CM

## 2024-01-08 DIAGNOSIS — E7849 Other hyperlipidemia: Secondary | ICD-10-CM

## 2024-01-08 DIAGNOSIS — R0602 Shortness of breath: Secondary | ICD-10-CM

## 2024-01-08 DIAGNOSIS — N1831 Chronic kidney disease, stage 3a: Secondary | ICD-10-CM | POA: Diagnosis not present

## 2024-01-08 DIAGNOSIS — Z6836 Body mass index (BMI) 36.0-36.9, adult: Secondary | ICD-10-CM

## 2024-01-08 DIAGNOSIS — R5383 Other fatigue: Secondary | ICD-10-CM

## 2024-01-08 DIAGNOSIS — E66812 Obesity, class 2: Secondary | ICD-10-CM

## 2024-01-08 DIAGNOSIS — E669 Obesity, unspecified: Secondary | ICD-10-CM

## 2024-01-08 DIAGNOSIS — E559 Vitamin D deficiency, unspecified: Secondary | ICD-10-CM

## 2024-01-08 NOTE — Progress Notes (Signed)
At a Glance:  Vitals Temp: 98 F (36.7 C) BP: 133/80 Pulse Rate: 60 SpO2: 100 %   Anthropometric Measurements Height: 5\' 8"  (1.727 m) Weight: 242 lb (109.8 kg) BMI (Calculated): 36.8 Starting Weight: 242lb   Body Composition  Body Fat %: 30.5 % Fat Mass (lbs): 74 lbs Muscle Mass (lbs): 160.6 lbs Total Body Water (lbs): 118.4 lbs Visceral Fat Rating : 16   Other Clinical Data RMR: 2635 Fasting: Yes Labs: Yes Today's Visit #: 1 Starting Date: 01/08/24    EKG: Normal sinus rhythm, Bradycardia, otherwise normal,rate 55.  Indirect Calorimeter:   Resting Metabolic Rate ( RMR):  RMR (actual): 2635 kcal RMR (calculated): 2295 kcal The calculated basal metabolic rate is 7253 kcal thus his basal metabolic rate is better than expected.  Plan:   Indirect calorimeter completed, interpreted and reviewed with patient today and allowed to ask questions.  Discussed the implications for the chosen plan and exercise based on the RMR reading.  Will consider repeating the RMR in the future based on weight loss.    Chief Complaint:  Obesity   Subjective:  Alex Mitchell (MR# [id number]) is a 43 y.o. male who presents for evaluation and treatment of obesity and related comorbidities.   Alex Mitchell is currently in the action stage of change and ready to dedicate time achieving and maintaining a healthier weight. Alex Mitchell is interested in becoming our patient and working on intensive lifestyle modifications including (but not limited to) diet and exercise for weight loss.  Alex Mitchell has been struggling with his weight. He has been unsuccessful in either losing weight, maintaining weight loss, or reaching his healthy weight goal.  Alex Mitchell's habits were reviewed today and are as follows: he started gaining weight recently, he snacks frequently in the evenings, he frequently makes poor food choices, and he struggles with emotional eating.  Current or previous pharmacotherapy: Phentermine,  stopped about 1 month ago.   Response to medication: Lost weight and was able to maintain weight loss  Other Fatigue Alex Mitchell denies daytime somnolence and denies waking up still tired. Patient has a history of no symptoms.  Alex Mitchell generally gets 5 or 6 hours of sleep per night, and states that he has difficulty falling back asleep if awakened. Snoring is not present. Apneic episodes are not present. Epworth Sleepiness Score is 4.   Shortness of Breath Alex Mitchell notes increasing shortness of breath with exercising and seems to be worsening over time with weight gain. He notes getting out of breath sooner with activity than he used to. This has gotten worse recently. Alex Mitchell denies shortness of breath at rest or orthopnea.  Depression Screen Alex Mitchell (modified PHQ-9) score was 3. <5 no depression     11/08/2023   10:49 AM  Depression screen PHQ 2/9  Decreased Interest 0  Down, Depressed, Hopeless 0  PHQ - 2 Score 0     Assessment and Plan:   Other Fatigue Alex Mitchell does feel that his weight is causing his energy to be lower than it should be. Fatigue may be related to obesity, depression or many other causes. Labs will be ordered, and in the meanwhile, Alex Mitchell will focus on self care including making healthy food choices, increasing physical activity and focusing on stress reduction.  Shortness of Breath Alex Mitchell does feel that he gets out of breath more easily that he used to when he exercises. Alex Mitchell's shortness of breath appears to be obesity related and exercise induced. He has agreed to work on Alex Mitchell  loss and gradually increase exercise to treat his exercise induced shortness of breath. Will continue to monitor closely.  Health Maintenance:   Obesity   Plan: Will do EKG, indirect calorimetry, and labs.    Prediabetes Last A1c was 5.8  Medication(s): none Lab Results  Component Value Date   HGBA1C 5.8 (H) 10/24/2022   HGBA1C 5.2 12/30/2019   HGBA1C 5.4 11/05/2018    No results found for: "INSULIN"  Plan: Will minimize all refined carbohydrates both sweets and starches.  Will work on the plan and exercise.  Consider both aerobic and resistance training.  Will keep protein, water, and fiber intake high.  Increase Polyunsaturated and Monounsaturated fats to increase satiety and encourage weight loss.  Aim for 7 to 9 hours of sleep nightly.   Hyperlipidemia LDL is not at goal. Medication(s): Lipitor  Cardiovascular risk factors: male gender, obesity (BMI >= 30 kg/m2), and sedentary lifestyle  Lab Results  Component Value Date   CHOL 159 08/28/2023   HDL 48 08/28/2023   LDLCALC 100 (H) 08/28/2023   TRIG 53 08/28/2023   CHOLHDL 3.3 08/28/2023   Lab Results  Component Value Date   ALT 16 12/11/2023   AST 26 12/11/2023   ALKPHOS 49 12/11/2023   BILITOT 0.2 12/11/2023   The 10-year ASCVD risk score (Arnett DK, et al., 2019) is: 3.5%   Values used to calculate the score:     Age: 25 years     Sex: Male     Is Non-Hispanic African American: Yes     Diabetic: No     Tobacco smoker: No     Systolic Blood Pressure: 133 mmHg     Is BP treated: No     HDL Cholesterol: 48 mg/dL     Total Cholesterol: 159 mg/dL  Plan:  Continue statin.  Information sheet on healthy vs unhealthy fats.  Will avoid all trans fats.  Will read labels Will minimize saturated fats except the following: low fat meats in moderation, diary, and limited dark chocolate.  Increase Omega 3 in foods, and consider an Omega 3 supplement.    Iron deficiency anemia:   He has a history of iron deficiency anemia and has had to take iron pills in the past.   Plan: Will check an anemia panel and ferritin level.    Chronic kidney disease, class 3a:   He has a history of chronic kidney disease, class A3 and has seen a nephrologist in the past and sees him every year.    Plan: Will check a CMP and other lab work.   Vitamin D Deficiency He is at risk for vitamin D  deficiency due to obesity.  He is on no vitamin D. He takes some supplements.  Lab Results  Component Value Date   VD25OH 25.8 (L) 09/05/2022   VD25OH 29.9 (L) 07/20/2020    Plan: Will check for vitamin D deficiency.    Previous labs reviewed today. Date: 12/11/2023 CMP, glucose, globulin.   Labs done today CMP, Lipids, Insulin, HgbA1c, Vit D, Thyroid Panel, Anemia Panel, and Ferritin   Generalized Obesity: BMI (Calculated): 36.8   Alex Mitchell is currently in the action stage of change and his goal is to begin weight loss efforts. I recommend Alex Mitchell begin the structured treatment plan as follows:  He has agreed to Category 4 Plan  Exercise goals: All adults should avoid inactivity. Some activity is better than none, and adults who participate in any amount of physical activity, gain some health  benefits.  Behavioral modification strategies:increasing lean protein intake, increasing vegetables, increase H2O intake, increase high fiber foods, no skipping meals, better snacking choices, and planning for success. He will work on his sleep hygiene.   He was informed of the importance of frequent follow-up visits to maximize his success with intensive lifestyle modifications for his multiple health conditions. He was informed we would discuss his lab results at his next visit unless there is a critical issue that needs to be addressed sooner. Alex Mitchell agreed to keep his next visit at the agreed upon time to discuss these results.  Objective:  General: Cooperative, alert, well developed, in no acute distress. HEENT: Conjunctivae and lids unremarkable. Cardiovascular: Regular rhythm.  Lungs: Normal work of breathing. Neurologic: No focal deficits.   Lab Results  Component Value Date   CREATININE 1.80 (H) 12/11/2023   BUN 17 12/11/2023   NA 140 12/11/2023   K 5.0 12/11/2023   CL 104 12/11/2023   CO2 22 12/11/2023   Lab Results  Component Value Date   ALT 16 12/11/2023   AST 26  12/11/2023   ALKPHOS 49 12/11/2023   BILITOT 0.2 12/11/2023   Lab Results  Component Value Date   HGBA1C 5.8 (H) 10/24/2022   HGBA1C 5.2 12/30/2019   HGBA1C 5.4 11/05/2018   No results found for: "INSULIN" Lab Results  Component Value Date   TSH 1.540 09/05/2022   Lab Results  Component Value Date   CHOL 159 08/28/2023   HDL 48 08/28/2023   LDLCALC 100 (H) 08/28/2023   TRIG 53 08/28/2023   CHOLHDL 3.3 08/28/2023   Lab Results  Component Value Date   WBC 6.3 10/23/2023   HGB 13.5 10/23/2023   HCT 39.6 10/23/2023   MCV 97.5 10/23/2023   PLT 206 10/23/2023   Lab Results  Component Value Date   IRON 61 07/26/2021   TIBC 325 07/26/2021   FERRITIN 264 07/26/2021    Attestation Statements:  Applicable history such as the following:  allergies, medications, problem list, medical history, surgical history, family history, social history, and previous encounter notes reviewed by clinician on day of visit:  Time spent on visit including the items listed below was 48 minutes.  -preparing to see the patient (e.g., review of tests, history, previous notes) -obtaining and/or reviewing separately obtained history -counseling and educating the patient/family/caregiver -documenting clinical information in the electronic or other health record -ordering medications, tests, or procedures -independently interpreting results and communicating results to the patient/ family/caregiver -referring and communicating with other health care professionals  -care coordination   This may have been prepared with the assistance of Engineer, civil (consulting).  Occasional wrong-word or sound-a-like substitutions may have occurred due to the inherent limitations of voice recognition software.    Corinna Capra, DO

## 2024-01-10 LAB — COMPREHENSIVE METABOLIC PANEL
ALT: 17 [IU]/L (ref 0–44)
AST: 25 [IU]/L (ref 0–40)
Albumin: 4.5 g/dL (ref 4.1–5.1)
Alkaline Phosphatase: 53 [IU]/L (ref 44–121)
BUN/Creatinine Ratio: 13 (ref 9–20)
BUN: 22 mg/dL (ref 6–24)
Bilirubin Total: 0.3 mg/dL (ref 0.0–1.2)
CO2: 22 mmol/L (ref 20–29)
Calcium: 9.6 mg/dL (ref 8.7–10.2)
Chloride: 101 mmol/L (ref 96–106)
Creatinine, Ser: 1.75 mg/dL — ABNORMAL HIGH (ref 0.76–1.27)
Globulin, Total: 2.5 g/dL (ref 1.5–4.5)
Glucose: 89 mg/dL (ref 70–99)
Potassium: 4.6 mmol/L (ref 3.5–5.2)
Sodium: 137 mmol/L (ref 134–144)
Total Protein: 7 g/dL (ref 6.0–8.5)
eGFR: 49 mL/min/{1.73_m2} — ABNORMAL LOW (ref >59)

## 2024-01-10 LAB — ANEMIA PANEL
Ferritin: 249 ng/mL (ref 30–400)
Folate, Hemolysate: 309 ng/mL
Folate, RBC: 815 ng/mL (ref >498)
Hematocrit: 37.9 % (ref 37.5–51.0)
Iron Saturation: 22 % (ref 15–55)
Iron: 77 ug/dL (ref 38–169)
Retic Ct Pct: 1.3 % (ref 0.6–2.6)
Total Iron Binding Capacity: 347 ug/dL (ref 250–450)
UIBC: 270 ug/dL (ref 111–343)
Vitamin B-12: 813 pg/mL (ref 232–1245)

## 2024-01-10 LAB — HEMOGLOBIN A1C
Est. average glucose Bld gHb Est-mCnc: 120 mg/dL
Hgb A1c MFr Bld: 5.8 % — ABNORMAL HIGH (ref 4.8–5.6)

## 2024-01-10 LAB — LIPID PANEL WITH LDL/HDL RATIO
Cholesterol, Total: 204 mg/dL — ABNORMAL HIGH (ref 100–199)
HDL: 54 mg/dL (ref >39)
LDL Chol Calc (NIH): 143 mg/dL — ABNORMAL HIGH (ref 0–99)
LDL/HDL Ratio: 2.6 {ratio} (ref 0.0–3.6)
Triglycerides: 41 mg/dL (ref 0–149)
VLDL Cholesterol Cal: 7 mg/dL (ref 5–40)

## 2024-01-10 LAB — TSH+T4F+T3FREE
Free T4: 1.01 ng/dL (ref 0.82–1.77)
T3, Free: 3.3 pg/mL (ref 2.0–4.4)
TSH: 1.85 u[IU]/mL (ref 0.450–4.500)

## 2024-01-10 LAB — VITAMIN D 25 HYDROXY (VIT D DEFICIENCY, FRACTURES): Vit D, 25-Hydroxy: 26.9 ng/mL — ABNORMAL LOW (ref 30.0–100.0)

## 2024-01-10 LAB — INSULIN, RANDOM: INSULIN: 9.2 u[IU]/mL (ref 2.6–24.9)

## 2024-01-13 ENCOUNTER — Encounter: Payer: Self-pay | Admitting: Bariatrics

## 2024-01-13 DIAGNOSIS — E559 Vitamin D deficiency, unspecified: Secondary | ICD-10-CM | POA: Insufficient documentation

## 2024-01-22 ENCOUNTER — Ambulatory Visit (INDEPENDENT_AMBULATORY_CARE_PROVIDER_SITE_OTHER): Payer: 59 | Admitting: Bariatrics

## 2024-01-22 ENCOUNTER — Encounter: Payer: Self-pay | Admitting: Bariatrics

## 2024-01-22 VITALS — BP 120/78 | HR 54 | Temp 98.2°F | Ht 68.0 in | Wt 244.0 lb

## 2024-01-22 DIAGNOSIS — R7303 Prediabetes: Secondary | ICD-10-CM | POA: Diagnosis not present

## 2024-01-22 DIAGNOSIS — F5089 Other specified eating disorder: Secondary | ICD-10-CM

## 2024-01-22 DIAGNOSIS — N1831 Chronic kidney disease, stage 3a: Secondary | ICD-10-CM | POA: Diagnosis not present

## 2024-01-22 DIAGNOSIS — R632 Polyphagia: Secondary | ICD-10-CM

## 2024-01-22 DIAGNOSIS — Z6837 Body mass index (BMI) 37.0-37.9, adult: Secondary | ICD-10-CM

## 2024-01-22 DIAGNOSIS — E669 Obesity, unspecified: Secondary | ICD-10-CM

## 2024-01-22 DIAGNOSIS — E559 Vitamin D deficiency, unspecified: Secondary | ICD-10-CM

## 2024-01-22 MED ORDER — TOPIRAMATE 50 MG PO TABS: 50.0000 mg | ORAL_TABLET | Freq: Two times a day (BID) | ORAL | 0 refills | Status: DC

## 2024-01-22 MED ORDER — PHENTERMINE HCL 30 MG PO CAPS: 30.0000 mg | ORAL_CAPSULE | ORAL | 0 refills | Status: DC

## 2024-01-22 MED ORDER — VITAMIN D (ERGOCALCIFEROL) 1.25 MG (50000 UNIT) PO CAPS: 50000.0000 [IU] | ORAL_CAPSULE | ORAL | 0 refills | Status: DC

## 2024-01-22 NOTE — Progress Notes (Signed)
 First follow-up after initial visit.        WEIGHT SUMMARY AND BIOMETRICS  Weight Lost Since Last Visit: 0lb  Weight Gained Since Last Visit: 2lb   Vitals Temp: 98.2 F (36.8 C) BP: 120/78 Pulse Rate: (!) 54 SpO2: 100 %   Anthropometric Measurements Height: 5\' 8"  (1.727 m) Weight: 244 lb (110.7 kg) BMI (Calculated): 37.11 Weight at Last Visit: 242lb Weight Lost Since Last Visit: 0lb Weight Gained Since Last Visit: 2lb Starting Weight: 242lb Total Weight Loss (lbs): 0 lb (0 kg)   Body Composition  Body Fat %: 30.8 % Fat Mass (lbs): 75.4 lbs Muscle Mass (lbs): 160.8 lbs Total Body Water (lbs): 118 lbs Visceral Fat Rating : 16   Other Clinical Data RMR: 2635 Fasting: No Labs: No Today's Visit #: 2 Starting Date: 01/08/24    OBESITY Alex Mitchell is here to discuss his progress with his obesity treatment plan along with follow-up of his obesity related diagnoses.    Nutrition Plan: the Category 4 plan - 80% adherence.  Current exercise: cardiovascular workout on exercise equipment and weightlifting  Interim History:  He is up 2 lb since his last visit.  Eating all of the food on the plan., Protein intake is as prescribed, Is not skipping meals, and Denies polyphagia  Initial positives regarding the dietary plan: He states that the plan is great. He has been working a lot. He is packing his lunch.  Initial challenges regarding  the dietary plan:  He is working on getting his protein.   Pharmacotherapy: Pacey is on no medications for weight at this time. He had been Phentermine in the past which worked well.   Hunger is moderately controlled.  Cravings are poorly controlled. He notices that his cravings are higher at noon.   Assessment/Plan:   Vitamin D Insuffiency:  Vitamin D is not at goal of 50.  Most recent vitamin D level was 26.9. He  is on  prescription ergocalciferol 50,000 IU weekly. Lab Results  Component Value Date   VD25OH 26.9 (L) 01/08/2024   VD25OH 25.8 (L) 09/05/2022   VD25OH 29.9 (L) 07/20/2020    Plan: Begin prescription vitamin D 50,000 IU weekly.   Prediabetes Last A1c was 5.8  Medication(s):  Topiramate 50 mg bid and Phentermine 30 mg by mouth daily in am   Lab Results  Component Value Date   INSULIN 9.2 01/08/2024    Plan:  Will minimize all refined carbohydrates both sweets and starches.  Will work on the plan and exercise.  Consider both aerobic and resistance training.  Will keep protein, water, and fiber intake high.  Increase Polyunsaturated and Monounsaturated fats to increase satiety and encourage weight loss.  Aim for 7 to 9 hours of sleep nightly.  He will start journaling and will use my Fitness Pal Start Phentermine 30 mg by mouth daily in am and Topamax twice daily   Stage 3 a chronic kidney disease:   His most recent GFR was 49. He is taking [place].   Plan: He will follow-up with his nephrologist.   Phyllis Ginger endorses excessive hunger.  Medication(s): none Appetite:  moderately controlled. Cravings are poorly controlled.   Plan: Medication(s): Topiramate 50 mg Bid and Phentermine 30 mg by mouth daily in am Will increase water, protein and fiber to help assuage hunger.  Will minimize foods that have a high glucose index/load to minimize reactive hypoglycemia.  Will start Phentermine today. Checked the PDMP, and controlled medication sheet for Phentermine, reviewed and  signed with the patient. He denies contraindications. Benefits and risks were discussed.  Rx: Phentermine 30 mg 1 daily in the AM #30 with no refills.      Eating disorder/emotional eating Melvyn has had issues with stress eating, emotional eating, nighttime eating, and boredom eating. Currently this is poorly controlled. Overall mood is stable. Denies suicidal/homicidal  ideation. Medication(s): none  Plan:  Motivational interviewing as well as evidence-based interventions for health behavior change were utilized today including the discussion of self monitoring techniques, problem-solving barriers and SMART goal setting techniques.  Discussed distractions to curb eating behaviors. Discussed activities to do with one's hands in the evening  Be sure to get adequate rest as lack of rest can trigger appetite.  Have plan in place for stressful events.  Consider other rewards besides food.   Will begin Topamax 50 mg Bid.  He will pack his lunch on a regular basis. Will bring good snacks to work.   Generalized Obesity: Current BMI BMI (Calculated): 37.11   Pharmacotherapy Plan Start  Topiramate 50 mg Bid and Phentermine 30 mg by mouth daily in am   Tyce is currently in the action stage of change. As such, his goal is to continue with weight loss efforts.  He has agreed to the Category 4 plan.  Exercise goals: For substantial health benefits, adults should do at least 150 minutes (2 hours and 30 minutes) a week of moderate-intensity, or 75 minutes (1 hour and 15 minutes) a week of vigorous-intensity aerobic physical activity, or an equivalent combination of moderate- and vigorous-intensity aerobic activity. Aerobic activity should be performed in episodes of at least 10 minutes, and preferably, it should be spread throughout the week.  Behavioral modification strategies: increasing lean protein intake, decreasing simple carbohydrates , no meal skipping, decrease eating out, meal planning , increase water intake, better snacking choices, planning for success, get rid of junk food in the home, ways to avoid boredom eating, avoiding temptations, keep healthy foods in the home, increase frequency of journaling, and weigh protein portions.  Jese has agreed to follow-up with our clinic in 2 weeks.   Labs reviewed today from last visit (CMP, Lipids, HgbA1c,  insulin, vitamin D, B 12, and thyroid panel).   Objective:   VITALS: Per patient if applicable, see vitals. GENERAL: Alert and in no acute distress. CARDIOPULMONARY: No increased WOB. Speaking in clear sentences.  PSYCH: Pleasant and cooperative. Speech normal rate and rhythm. Affect is appropriate. Insight and judgement are appropriate. Attention is focused, linear, and appropriate.  NEURO: Oriented as arrived to appointment on time with no prompting.   Attestation Statements:    This was prepared with the assistance of Engineer, civil (consulting).  Occasional wrong-word or sound-a-like substitutions may have occurred due to the inherent limitations of voice recognition software.    Corinna Capra, DO

## 2024-02-19 ENCOUNTER — Ambulatory Visit (INDEPENDENT_AMBULATORY_CARE_PROVIDER_SITE_OTHER): Admitting: Bariatrics

## 2024-02-19 ENCOUNTER — Encounter: Payer: Self-pay | Admitting: Bariatrics

## 2024-02-19 VITALS — BP 130/78 | HR 57 | Ht 68.0 in | Wt 235.0 lb

## 2024-02-19 DIAGNOSIS — E669 Obesity, unspecified: Secondary | ICD-10-CM | POA: Diagnosis not present

## 2024-02-19 DIAGNOSIS — Z6835 Body mass index (BMI) 35.0-35.9, adult: Secondary | ICD-10-CM

## 2024-02-19 DIAGNOSIS — E559 Vitamin D deficiency, unspecified: Secondary | ICD-10-CM | POA: Diagnosis not present

## 2024-02-19 DIAGNOSIS — R632 Polyphagia: Secondary | ICD-10-CM

## 2024-02-19 DIAGNOSIS — F5089 Other specified eating disorder: Secondary | ICD-10-CM

## 2024-02-19 DIAGNOSIS — E66812 Obesity, class 2: Secondary | ICD-10-CM

## 2024-02-19 MED ORDER — TOPIRAMATE 50 MG PO TABS: 50.0000 mg | ORAL_TABLET | Freq: Two times a day (BID) | ORAL | 0 refills | Status: DC

## 2024-02-19 MED ORDER — VITAMIN D (ERGOCALCIFEROL) 1.25 MG (50000 UNIT) PO CAPS: 50000.0000 [IU] | ORAL_CAPSULE | ORAL | 0 refills | Status: DC

## 2024-02-19 MED ORDER — PHENTERMINE HCL 30 MG PO CAPS: 30.0000 mg | ORAL_CAPSULE | ORAL | 0 refills | Status: DC

## 2024-02-19 NOTE — Progress Notes (Signed)
 WEIGHT SUMMARY AND BIOMETRICS  Weight Lost Since Last Visit: 9lb  Weight Gained Since Last Visit: 0   Vitals Temp: 0 F (-17.8 C) (could not obtain) BP: 130/78 Pulse Rate: (!) 57 SpO2: 100 %   Anthropometric Measurements Height: 5\' 8"  (1.727 m) Weight: 235 lb (106.6 kg) BMI (Calculated): 35.74 Weight at Last Visit: 244lb Weight Lost Since Last Visit: 9lb Weight Gained Since Last Visit: 0 Starting Weight: 242lb Total Weight Loss (lbs): 7 lb (3.175 kg)   Body Composition  Body Fat %: 29.5 % Fat Mass (lbs): 69.4 lbs Muscle Mass (lbs): 157.8 lbs Total Body Water (lbs): 113.8 lbs Visceral Fat Rating : 15   Other Clinical Data Fasting: yes Labs: no Today's Visit #: 3 Starting Date: 01/08/24    OBESITY Lindwood is here to discuss his progress with his obesity treatment plan along with follow-up of his obesity related diagnoses.    Nutrition Plan: the Category 4 plan - 50% adherence.  Current exercise:  Goes to the gym for exercise.  Interim History:  He is down 9 lbs since his last visit.  Eating all of the food on the plan., Protein intake is as prescribed, Is not skipping meals, Not journaling consistently., Water intake is adequate., and Denies polyphagia   Pharmacotherapy: Anhad is on Phentermine 30 mg by mouth daily in am Adverse side effects: None Hunger is moderately controlled.  Cravings are moderately controlled.  Assessment/Plan:   Eating disorder/emotional eating: Shaydon has had issues with stress eating and emotional eating. Currently this is moderately controlled. Overall mood is stable. Denies suicidal/homicidal ideation. Medication(s): Topamax 50 mg BID  Plan:  Specifically regarding patient's less desirable eating habits and patterns, we employed the technique of small changes when he cannot fully commit to his prudent  nutritional plan. Discussed distractions to curb eating behaviors. Discussed activities to do with one's hands in the evening  Be sure to get adequate rest as lack of rest can trigger appetite.  Have plan in place for stressful events.  Consider other rewards besides food.    Polyphagia:  Emmerson endorses excessive hunger.  Medication(s): Phentermine 30 mg Effects of medication on appetite:  moderately controlled. Cravings are moderately controlled.   Plan: Medication(s): Topiramate 50 mg BID and Phentermine 30 mg by mouth daily in am Will increase water, protein and fiber to help assuage hunger.  Will minimize foods that have a high glucose index/load to minimize reactive hypoglycemia.  Will start to journal on a regular basis recording his calories and protein.   Vitamin D Insuffiency:  Vitamin D is not at goal of 50.  Most recent vitamin D level was 26.9. He is on  prescription ergocalciferol 50,000 IU weekly. Lab Results  Component Value Date   VD25OH 26.9 (L) 01/08/2024   VD25OH 25.8 (L) 09/05/2022   VD25OH 29.9 (L) 07/20/2020    Plan:  Refill prescription vitamin D 50,000 IU weekly.     Generalized Obesity: Current BMI BMI (Calculated): 35.74   Pharmacotherapy Plan Continue and refill  Topiramate 50 mg BID and Phentermine 30 mg by mouth daily in am  Zacory is currently in the action stage of change. As such, his goal is to continue with weight loss efforts.  He has agreed to the Category 4 plan.  Exercise goals: All adults should avoid inactivity. Some physical activity is better than none, and adults who participate in any amount of physical activity gain some health benefits.  Behavioral modification strategies: increasing lean protein intake, decreasing simple carbohydrates , no meal skipping, meal planning , increase water intake, better snacking choices, and planning for success.  Junior has agreed to follow-up with our clinic in 2 weeks.     Objective:    VITALS: Per patient if applicable, see vitals. GENERAL: Alert and in no acute distress. CARDIOPULMONARY: No increased WOB. Speaking in clear sentences.  PSYCH: Pleasant and cooperative. Speech normal rate and rhythm. Affect is appropriate. Insight and judgement are appropriate. Attention is focused, linear, and appropriate.  NEURO: Oriented as arrived to appointment on time with no prompting.   Attestation Statements:    This was prepared with the assistance of Engineer, civil (consulting).  Occasional wrong-word or sound-a-like substitutions may have occurred due to the inherent limitations of voice recognition   Corinna Capra, DO

## 2024-03-04 ENCOUNTER — Ambulatory Visit: Admitting: Bariatrics

## 2024-03-10 ENCOUNTER — Ambulatory Visit: Admitting: Bariatrics

## 2024-03-18 ENCOUNTER — Other Ambulatory Visit: Payer: Self-pay | Admitting: Bariatrics

## 2024-03-18 DIAGNOSIS — E66812 Obesity, class 2: Secondary | ICD-10-CM

## 2024-03-18 DIAGNOSIS — F5089 Other specified eating disorder: Secondary | ICD-10-CM

## 2024-03-31 ENCOUNTER — Other Ambulatory Visit: Payer: Self-pay | Admitting: Bariatrics

## 2024-04-06 NOTE — Progress Notes (Signed)
 The 10-year ASCVD risk score (Arnett DK, et al., 2019) is: 3.5%   Values used to calculate the score:     Age: 43 years     Sex: Male     Is Non-Hispanic African American: Yes     Diabetic: No     Tobacco smoker: No     Systolic Blood Pressure: 130 mmHg     Is BP treated: No     HDL Cholesterol: 54 mg/dL     Total Cholesterol: 204 mg/dL  Currently prescribed atorvastatin  20 mg.   Daouda Lonzo, BSN, RN

## 2024-04-10 ENCOUNTER — Other Ambulatory Visit: Payer: Self-pay

## 2024-04-10 ENCOUNTER — Ambulatory Visit (INDEPENDENT_AMBULATORY_CARE_PROVIDER_SITE_OTHER): Payer: Self-pay | Admitting: Nurse Practitioner

## 2024-04-10 ENCOUNTER — Encounter: Payer: Self-pay | Admitting: Nurse Practitioner

## 2024-04-10 ENCOUNTER — Telehealth: Payer: Self-pay

## 2024-04-10 VITALS — BP 128/69 | HR 73 | Ht 68.0 in | Wt 238.0 lb

## 2024-04-10 DIAGNOSIS — B20 Human immunodeficiency virus [HIV] disease: Secondary | ICD-10-CM

## 2024-04-10 DIAGNOSIS — Z6837 Body mass index (BMI) 37.0-37.9, adult: Secondary | ICD-10-CM

## 2024-04-10 DIAGNOSIS — E785 Hyperlipidemia, unspecified: Secondary | ICD-10-CM | POA: Diagnosis not present

## 2024-04-10 DIAGNOSIS — N1831 Chronic kidney disease, stage 3a: Secondary | ICD-10-CM

## 2024-04-10 DIAGNOSIS — Z0000 Encounter for general adult medical examination without abnormal findings: Secondary | ICD-10-CM

## 2024-04-10 MED ORDER — ATORVASTATIN CALCIUM 20 MG PO TABS: 20.0000 mg | ORAL_TABLET | Freq: Every day | ORAL | 1 refills | Status: AC

## 2024-04-10 MED ORDER — DAPAGLIFLOZIN PROPANEDIOL 10 MG PO TABS: 10.0000 mg | ORAL_TABLET | Freq: Every day | ORAL | 1 refills | Status: AC

## 2024-04-10 MED ORDER — DOXYCYCLINE HYCLATE 100 MG PO TABS: 200.0000 mg | ORAL_TABLET | ORAL | 1 refills | Status: DC

## 2024-04-10 NOTE — Assessment & Plan Note (Signed)
 Wt Readings from Last 3 Encounters:  04/10/24 238 lb (108 kg)  02/19/24 235 lb (106.6 kg)  01/22/24 244 lb (110.7 kg)   Body mass index is 36.19 kg/m.  Continue phentermine  30 mg daily, Topamax  50 mg twice daily Patient counseled on low-carb diet, engage in regular moderate exercises at least 150 minutes weekly as tolerated Maintain close follow-up with the weight management specialist

## 2024-04-10 NOTE — Assessment & Plan Note (Signed)
 Lab Results  Component Value Date   NA 137 01/08/2024   K 4.6 01/08/2024   CO2 22 01/08/2024   GLUCOSE 89 01/08/2024   BUN 22 01/08/2024   CREATININE 1.75 (H) 01/08/2024   CALCIUM  9.6 01/08/2024   EGFR 49 (L) 01/08/2024   GFRNONAA 48 (L) 12/07/2019  Continue Farxiga  10 mg daily Maintain hydration and avoid NSAIDs and other nephrotoxic agents

## 2024-04-10 NOTE — Telephone Encounter (Signed)
 Pharmacy Patient Advocate Encounter   Received notification from CoverMyMeds that prior authorization for FARXIGA  is required/requested.   Insurance verification completed.   The patient is insured through [employer] .   Per test claim: PA required; PA submitted to above mentioned insurance via CoverMyMeds Key/confirmation #/EOC  [id number] Status is pending

## 2024-04-10 NOTE — Progress Notes (Signed)
 Complete physical exam  Patient: Alex Mitchell   DOB: July 08, 1981   43 y.o. Male  MRN: [id number]  Subjective:     Chief Complaint  Patient presents with   Annual Exam    Fasting     Saveon Plant is a 43 y.o. male  has a past medical history of AIDS (acquired immune deficiency syndrome) (HCC) (10/31/2018), [place], Elevated serum creatinine, Encounter for immunization (08/28/2023), Hyperlipidemia, Mouth sores, Obesity, Prediabetes, Seasonal allergies (09/30/2019), and Stage 3a chronic kidney disease (HCC) (05/22/2023). who presents today for a complete physical exam. He reports consuming a high protein, low carb diet , goes to the GYM 4 days a week  diet. He generally feels well. He reports sleeping well. He does not have additional problems to discuss today.          Most recent fall risk assessment:    11/08/2023   10:49 AM  Fall Risk   Falls in the past year? 0  Number falls in past yr: 0  Injury with Fall? 0  Risk for fall due to : No Fall Risks  Follow up Falls evaluation completed     Most recent depression screenings:    04/10/2024    8:55 AM 11/08/2023   10:49 AM  PHQ 2/9 Scores  PHQ - 2 Score 0 0        Patient Care Team: Jhon Mallozzi R, FNP as PCP - General (Nurse Practitioner) Orson Blalock, NP as Nurse Practitioner (Infectious Diseases)   Outpatient Medications Prior to Visit  Medication Sig Note   dolutegravir-lamiVUDine (DOVATO ) 50-300 MG tablet Take 1 tablet by mouth daily.    fluticasone  (FLONASE ) 50 MCG/ACT nasal spray Place 2 sprays into both nostrils daily.    phentermine  30 MG capsule Take 1 capsule (30 mg total) by mouth every morning.    topiramate  (TOPAMAX ) 50 MG tablet Take 1 tablet (50 mg total) by mouth 2 (two) times daily.    Vitamin D , Ergocalciferol , (DRISDOL ) 1.25 MG (50000 UNIT) CAPS capsule Take 1 capsule (50,000 Units total) by mouth every 7 (seven) days.    [DISCONTINUED] atorvastatin  (LIPITOR) 20 MG tablet TAKE 1  TABLET BY MOUTH EVERY DAY    [DISCONTINUED] dapagliflozin  propanediol (FARXIGA ) 10 MG TABS tablet Take 1 tablet (10 mg total) by mouth daily before breakfast.    [DISCONTINUED] doxycycline  (VIBRA -TABS) 100 MG tablet Take 2 tablets (200 mg total) by mouth See admin instructions. Within 24-72 hours of unprotected oral or genital sex. 04/10/2024: Prn    albuterol  (VENTOLIN  HFA) 108 (90 Base) MCG/ACT inhaler Inhale 1-2 puffs into the lungs every 6 (six) hours as needed. (Patient not taking: Reported on 04/10/2024)    cetirizine  (ZYRTEC ) 10 MG tablet Take 1 tablet (10 mg total) by mouth daily. (Patient not taking: Reported on 04/10/2024)    Cranberry 250 MG CHEW Chew by mouth. (Patient not taking: Reported on 04/10/2024)    [DISCONTINUED] cabotegravir  & rilpivirine  ER (CABENUVA ) 600 & 900 MG/3ML injection Inject 1 kit into the muscle every 30 (thirty) days. (Patient not taking: Reported on 04/10/2024)    No facility-administered medications prior to visit.    Review of Systems  Constitutional:  Negative for appetite change, chills, fatigue and fever.  HENT:  Negative for congestion, postnasal drip, rhinorrhea and sneezing.   Eyes:  Negative for pain, discharge and itching.  Respiratory:  Negative for cough, shortness of breath and wheezing.   Cardiovascular:  Negative for chest pain, palpitations and leg swelling.  Gastrointestinal:  Negative for abdominal pain, constipation, nausea and vomiting.  Endocrine: Negative for cold intolerance, heat intolerance and polydipsia.  Genitourinary:  Negative for difficulty urinating, dysuria, flank pain and frequency.  Musculoskeletal:  Negative for arthralgias, back pain, joint swelling and myalgias.  Skin:  Negative for color change, pallor, rash and wound.  Allergic/Immunologic: Positive for immunocompromised state. Negative for environmental allergies and food allergies.  Neurological:  Negative for dizziness, facial asymmetry, weakness, numbness and  headaches.  Psychiatric/Behavioral:  Negative for behavioral problems, confusion, self-injury and suicidal ideas.        Objective:     BP 128/69   Pulse 73   Ht 5\' 8"  (1.727 m)   Wt 238 lb (108 kg)   SpO2 100%   BMI 36.19 kg/m    Physical Exam Vitals and nursing note reviewed.  Constitutional:      General: He is not in acute distress.    Appearance: Normal appearance. He is obese. He is not ill-appearing, toxic-appearing or diaphoretic.  HENT:     Right Ear: Tympanic membrane, ear canal and external ear normal. There is no impacted cerumen.     Left Ear: Tympanic membrane, ear canal and external ear normal. There is no impacted cerumen.     Nose: Nose normal. No congestion or rhinorrhea.     Mouth/Throat:     Mouth: Mucous membranes are moist.     Pharynx: Oropharynx is clear. No oropharyngeal exudate or posterior oropharyngeal erythema.  Eyes:     General: No scleral icterus.       Right eye: No discharge.        Left eye: No discharge.     Extraocular Movements: Extraocular movements intact.     Conjunctiva/sclera: Conjunctivae normal.  Neck:     Vascular: No carotid bruit.  Cardiovascular:     Rate and Rhythm: Normal rate and regular rhythm.     Pulses: Normal pulses.     Heart sounds: Normal heart sounds. No murmur heard.    No friction rub. No gallop.  Pulmonary:     Effort: Pulmonary effort is normal. No respiratory distress.     Breath sounds: Normal breath sounds. No stridor. No wheezing, rhonchi or rales.  Chest:     Chest wall: No tenderness.  Abdominal:     General: Bowel sounds are normal. There is no distension.     Palpations: Abdomen is soft. There is no mass.     Tenderness: There is no abdominal tenderness. There is no right CVA tenderness, left CVA tenderness, guarding or rebound.     Hernia: No hernia is present.  Musculoskeletal:        General: No swelling, tenderness, deformity or signs of injury.     Cervical back: Normal range of motion  and neck supple. No rigidity or tenderness.     Right lower leg: No edema.     Left lower leg: No edema.  Lymphadenopathy:     Cervical: No cervical adenopathy.  Skin:    General: Skin is warm and dry.     Capillary Refill: Capillary refill takes less than 2 seconds.     Coloration: Skin is not jaundiced or pale.     Findings: No bruising, erythema, lesion or rash.  Neurological:     Mental Status: He is alert and oriented to person, place, and time.     Cranial Nerves: No cranial nerve deficit.     Sensory: No sensory deficit.     Motor: No  weakness.     Coordination: Coordination normal.     Gait: Gait normal.     Deep Tendon Reflexes: Reflexes normal.  Psychiatric:        Mood and Affect: Mood normal.        Behavior: Behavior normal.        Thought Content: Thought content normal.        Judgment: Judgment normal.     No results found for any visits on 04/10/24.     Assessment & Plan:    Routine Health Maintenance and Physical Exam  Immunization History  Administered Date(s) Administered   Influenza, Seasonal, Injecte, Preservative Fre 08/28/2023   Influenza,inj,Quad PF,6+ Mos 11/24/2018, 08/13/2019, 10/11/2020, 09/07/2021, 09/05/2022   Meningococcal Mcv4o 02/22/2021   PFIZER(Purple Top)SARS-COV-2 Vaccination 02/11/2020, 03/07/2020, 11/23/2020   Pfizer(Comirnaty)Fall Seasonal Vaccine 12 years and older 11/08/2022   Pneumococcal Conjugate-13 08/13/2019, 10/13/2019   Pneumococcal Polysaccharide-23 10/11/2020   Td,absorbed, Preservative Free, Adult Use, Lf Unspecified 12/23/2021   Tdap 01/01/2015    Health Maintenance  Topic Date Due   INFLUENZA VACCINE  06/19/2024   Pneumococcal Vaccine 32-45 Years old (3 of 3 - PPSV23, PCV20 or PCV21) 10/11/2025   DTaP/Tdap/Td (3 - Td or Tdap) 12/24/2031   Hepatitis C Screening  Completed   HIV Screening  Completed   HPV VACCINES  Aged Out   Meningococcal B Vaccine  Aged Out   COVID-19 Vaccine  Discontinued    Discussed  health benefits of physical activity, and encouraged him to engage in regular exercise appropriate for his age and condition.  Problem List Items Addressed This Visit       Genitourinary   Stage 3a chronic kidney disease (HCC)   Lab Results  Component Value Date   NA 137 01/08/2024   K 4.6 01/08/2024   CO2 22 01/08/2024   GLUCOSE 89 01/08/2024   BUN 22 01/08/2024   CREATININE 1.75 (H) 01/08/2024   CALCIUM  9.6 01/08/2024   EGFR 49 (L) 01/08/2024   GFRNONAA 48 (L) 12/07/2019  Continue Farxiga  10 mg daily Maintain hydration and avoid NSAIDs and other nephrotoxic agents      Relevant Medications   dapagliflozin  propanediol (FARXIGA ) 10 MG TABS tablet     Other   HIV disease (HCC) (Chronic)   Encouraged to maintain close follow-up with infectious disease specialist Continue Dovato  daily      BMI 37.0-37.9, adult   Wt Readings from Last 3 Encounters:  04/10/24 238 lb (108 kg)  02/19/24 235 lb (106.6 kg)  01/22/24 244 lb (110.7 kg)   Body mass index is 36.19 kg/m.  Continue phentermine  30 mg daily, Topamax  50 mg twice daily Patient counseled on low-carb diet, engage in regular moderate exercises at least 150 minutes weekly as tolerated Maintain close follow-up with the weight management specialist      Annual physical exam - Primary   Annual exam as documented.  Counseling done include healthy lifestyle involving committing to 150 minutes of exercise per week, heart healthy diet, and attaining healthy weight. The importance of adequate sleep also discussed.  Regular use of seat belt and home safety were also discussed . Immunization and cancer screening  needs are specifically addressed at this visit.         Hyperlipidemia   Lab Results  Component Value Date   CHOL 204 (H) 01/08/2024   HDL 54 01/08/2024   LDLCALC 143 (H) 01/08/2024   TRIG 41 01/08/2024   CHOLHDL 3.3 08/28/2023  Continue atorvastatin  20 mg  daily Avoid fatty fried foods      Relevant Medications    atorvastatin  (LIPITOR) 20 MG tablet   Return in about 1 year (around 04/10/2025) for CPE.     Rita Vialpando R Noelani Harbach, FNP

## 2024-04-10 NOTE — Patient Instructions (Signed)

## 2024-04-10 NOTE — Assessment & Plan Note (Signed)
 Encouraged to maintain close follow-up with infectious disease specialist Continue Dovato  daily

## 2024-04-10 NOTE — Assessment & Plan Note (Signed)
 Annual exam as documented.  ?Counseling done include healthy lifestyle involving committing to 150 minutes of exercise per week, heart healthy diet, and attaining healthy weight. The importance of adequate sleep also discussed.  ?Regular use of seat belt and home safety were also discussed . ?Immunization and cancer screening  needs are specifically addressed at this visit.   ?

## 2024-04-10 NOTE — Assessment & Plan Note (Signed)
 Lab Results  Component Value Date   CHOL 204 (H) 01/08/2024   HDL 54 01/08/2024   LDLCALC 143 (H) 01/08/2024   TRIG 41 01/08/2024   CHOLHDL 3.3 08/28/2023  Continue atorvastatin  20 mg daily Avoid fatty fried foods

## 2024-04-23 ENCOUNTER — Other Ambulatory Visit: Payer: Self-pay

## 2024-04-23 DIAGNOSIS — Z113 Encounter for screening for infections with a predominantly sexual mode of transmission: Secondary | ICD-10-CM

## 2024-04-23 DIAGNOSIS — Z21 Asymptomatic human immunodeficiency virus [HIV] infection status: Secondary | ICD-10-CM

## 2024-05-01 ENCOUNTER — Other Ambulatory Visit: Payer: Self-pay

## 2024-05-01 ENCOUNTER — Other Ambulatory Visit: Payer: 59

## 2024-05-01 DIAGNOSIS — Z113 Encounter for screening for infections with a predominantly sexual mode of transmission: Secondary | ICD-10-CM

## 2024-05-01 DIAGNOSIS — Z21 Asymptomatic human immunodeficiency virus [HIV] infection status: Secondary | ICD-10-CM

## 2024-05-02 LAB — C. TRACHOMATIS/N. GONORRHOEAE RNA
C. trachomatis RNA, TMA: NOT DETECTED
N. gonorrhoeae RNA, TMA: NOT DETECTED

## 2024-05-04 LAB — COMPLETE METABOLIC PANEL WITHOUT GFR
AG Ratio: 1.8 (calc) (ref 1.0–2.5)
ALT: 11 U/L (ref 9–46)
AST: 16 U/L (ref 10–40)
Albumin: 4.4 g/dL (ref 3.6–5.1)
Alkaline phosphatase (APISO): 39 U/L (ref 36–130)
BUN/Creatinine Ratio: 9 (calc) (ref 6–22)
BUN: 18 mg/dL (ref 7–25)
CO2: 21 mmol/L (ref 20–32)
Calcium: 9.2 mg/dL (ref 8.6–10.3)
Chloride: 107 mmol/L (ref 98–110)
Creat: 2.03 mg/dL — ABNORMAL HIGH (ref 0.60–1.29)
Globulin: 2.5 g/dL (ref 1.9–3.7)
Glucose, Bld: 93 mg/dL (ref 65–99)
Potassium: 4 mmol/L (ref 3.5–5.3)
Sodium: 135 mmol/L (ref 135–146)
Total Bilirubin: 0.4 mg/dL (ref 0.2–1.2)
Total Protein: 6.9 g/dL (ref 6.1–8.1)

## 2024-05-04 LAB — CBC WITH DIFFERENTIAL/PLATELET
Absolute Lymphocytes: 2651 {cells}/uL (ref 850–3900)
Absolute Monocytes: 452 {cells}/uL (ref 200–950)
Basophils Absolute: 17 {cells}/uL (ref 0–200)
Basophils Relative: 0.3 %
Eosinophils Absolute: 342 {cells}/uL (ref 15–500)
Eosinophils Relative: 5.9 %
HCT: 39.2 % (ref 38.5–50.0)
Hemoglobin: 12.6 g/dL — ABNORMAL LOW (ref 13.2–17.1)
MCH: 32.2 pg (ref 27.0–33.0)
MCHC: 32.1 g/dL (ref 32.0–36.0)
MCV: 100.3 fL — ABNORMAL HIGH (ref 80.0–100.0)
MPV: 11.5 fL (ref 7.5–12.5)
Monocytes Relative: 7.8 %
Neutro Abs: 2337 {cells}/uL (ref 1500–7800)
Neutrophils Relative %: 40.3 %
Platelets: 184 10*3/uL (ref 140–400)
RBC: 3.91 10*6/uL — ABNORMAL LOW (ref 4.20–5.80)
RDW: 13.4 % (ref 11.0–15.0)
Total Lymphocyte: 45.7 %
WBC: 5.8 10*3/uL (ref 3.8–10.8)

## 2024-05-04 LAB — HIV-1 RNA QUANT-NO REFLEX-BLD
HIV 1 RNA Quant: NOT DETECTED {copies}/mL
HIV-1 RNA Quant, Log: NOT DETECTED {Log_copies}/mL

## 2024-05-04 LAB — RPR: RPR Ser Ql: NONREACTIVE

## 2024-05-04 LAB — T-HELPER CELLS (CD4) COUNT (NOT AT ARMC)
Absolute CD4: 426 {cells}/uL — ABNORMAL LOW (ref 490–1740)
CD4 T Helper %: 16 % — ABNORMAL LOW (ref 30–61)
Total lymphocyte count: 2619 {cells}/uL (ref 850–3900)

## 2024-05-05 ENCOUNTER — Ambulatory Visit: Payer: Self-pay | Admitting: Infectious Diseases

## 2024-05-06 ENCOUNTER — Encounter: Payer: Self-pay | Admitting: Nurse Practitioner

## 2024-05-06 ENCOUNTER — Ambulatory Visit (INDEPENDENT_AMBULATORY_CARE_PROVIDER_SITE_OTHER): Admitting: Nurse Practitioner

## 2024-05-06 VITALS — BP 125/78 | HR 54 | Temp 98.2°F | Ht 68.0 in | Wt 235.0 lb

## 2024-05-06 DIAGNOSIS — E66812 Obesity, class 2: Secondary | ICD-10-CM | POA: Diagnosis not present

## 2024-05-06 DIAGNOSIS — F5089 Other specified eating disorder: Secondary | ICD-10-CM

## 2024-05-06 DIAGNOSIS — R632 Polyphagia: Secondary | ICD-10-CM | POA: Diagnosis not present

## 2024-05-06 DIAGNOSIS — E559 Vitamin D deficiency, unspecified: Secondary | ICD-10-CM

## 2024-05-06 DIAGNOSIS — Z6835 Body mass index (BMI) 35.0-35.9, adult: Secondary | ICD-10-CM

## 2024-05-06 MED ORDER — VITAMIN D (ERGOCALCIFEROL) 1.25 MG (50000 UNIT) PO CAPS: 50000.0000 [IU] | ORAL_CAPSULE | ORAL | 0 refills | Status: DC

## 2024-05-06 MED ORDER — PHENTERMINE HCL 30 MG PO CAPS: 30.0000 mg | ORAL_CAPSULE | ORAL | 0 refills | Status: DC

## 2024-05-06 MED ORDER — TOPIRAMATE 50 MG PO TABS: 50.0000 mg | ORAL_TABLET | Freq: Two times a day (BID) | ORAL | 0 refills | Status: DC

## 2024-05-06 NOTE — Progress Notes (Signed)
 Office: [phone]  /  Fax: [phone]  WEIGHT SUMMARY AND BIOMETRICS  Weight Lost Since Last Visit: 0lb  Weight Gained Since Last Visit: 0lb   Vitals Temp: 98.2 F (36.8 C) BP: 125/78 Pulse Rate: (!) 54 SpO2: 100 %   Anthropometric Measurements Height: 5' 8 (1.727 m) Weight: 235 lb (106.6 kg) BMI (Calculated): 35.74 Weight at Last Visit: 235lb Weight Lost Since Last Visit: 0lb Weight Gained Since Last Visit: 0lb Starting Weight: 242lb Total Weight Loss (lbs): 7 lb (3.175 kg)   Body Composition  Body Fat %: 29 % Fat Mass (lbs): 68.2 lbs Muscle Mass (lbs): 158.8 lbs Total Body Water (lbs): 114.8 lbs Visceral Fat Rating : 15   Other Clinical Data Fasting: Yes Labs: No Today's Visit #: 4 Starting Date: 01/08/24     HPI  Chief Complaint: OBESITY  Alex Mitchell is here to discuss his progress with his obesity treatment plan. He is on the the Category 4 Plan and states he is following his eating plan approximately 50 % of the time. He states he is exercising 30 minutes 2 days per week.   Interval History:  Since last office visit he has maintained his weight.  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   BF:  skipping (in the past was drinking a protein shake) Snack:  honeybun, peach gummies Lunch:  skipping Snack:  honeybun or gummies Dinner:  protein, rice, broccoli  Drinks:  water, Gatorade and an energy drink daily    Pharmacotherapy for weight loss: He is currently taking Phentermine  30 mg for medical weight loss (hasn't taken x 1 month) and topamax  50mg  BID for food impulse control and cravings.  Denies side effects.  Denies chest pain, SHOB or palpitations.    Previous pharmacotherapy for medical weight loss:  Phentermine  37.5-side effects headaches  Bariatric surgery:  Patient has not had bariatric surgery.     Vit D deficiency  He is taking Vit D 50,000 IU weekly.  Denies side effects.  Denies nausea, vomiting or muscle weakness.    Lab Results  Component  Value Date   VD25OH 26.9 (L) 01/08/2024   VD25OH 25.8 (L) 09/05/2022   VD25OH 29.9 (L) 07/20/2020      PHYSICAL EXAM:  Blood pressure 125/78, pulse (!) 54, temperature 98.2 F (36.8 C), height 5' 8 (1.727 m), weight 235 lb (106.6 kg), SpO2 100%. Body mass index is 35.73 kg/m.  General: He is overweight, cooperative, alert, well developed, and in no acute distress. PSYCH: Has normal mood, affect and thought process.   Extremities: No edema.  Neurologic: No gross sensory or motor deficits. No tremors or fasciculations noted.    DIAGNOSTIC DATA REVIEWED:  BMET    Component Value Date/Time   NA 135 05/01/2024 0857   NA 137 01/08/2024 0914   K 4.0 05/01/2024 0857   CL 107 05/01/2024 0857   CO2 21 05/01/2024 0857   GLUCOSE 93 05/01/2024 0857   BUN 18 05/01/2024 0857   BUN 22 01/08/2024 0914   CREATININE 2.03 (H) 05/01/2024 0857   CALCIUM  9.2 05/01/2024 0857   GFRNONAA 48 (L) 12/07/2019 0949   GFRAA 55 (L) 12/07/2019 0949   Lab Results  Component Value Date   HGBA1C 5.8 (H) 01/08/2024   HGBA1C 5.4 11/05/2018   Lab Results  Component Value Date   INSULIN  9.2 01/08/2024   Lab Results  Component Value Date   TSH 1.850 01/08/2024   CBC    Component Value Date/Time   WBC 5.8  05/01/2024 0857   RBC 3.91 (L) 05/01/2024 0857   HGB 12.6 (L) 05/01/2024 0857   HGB 12.7 (L) 05/01/2023 0951   HCT 39.2 05/01/2024 0857   HCT 37.9 01/08/2024 0914   PLT 184 05/01/2024 0857   PLT 211 05/01/2023 0951   MCV 100.3 (H) 05/01/2024 0857   MCV 97 05/01/2023 0951   MCH 32.2 05/01/2024 0857   MCHC 32.1 05/01/2024 0857   RDW 13.4 05/01/2024 0857   RDW 13.3 05/01/2023 0951   Iron Studies    Component Value Date/Time   IRON 77 01/08/2024 0914   TIBC 347 01/08/2024 0914   FERRITIN 249 01/08/2024 0914   IRONPCTSAT 22 01/08/2024 0914   IRONPCTSAT 19 (L) 08/13/2019 1031   Lipid Panel     Component Value Date/Time   CHOL 204 (H) 01/08/2024 0914   TRIG 41 01/08/2024 0914   HDL  54 01/08/2024 0914   CHOLHDL 3.3 08/28/2023 0933   CHOLHDL 4.5 05/01/2023 0951   LDLCALC 143 (H) 01/08/2024 0914   LDLCALC 161 (H) 05/01/2023 0951   Hepatic Function Panel     Component Value Date/Time   PROT 6.9 05/01/2024 0857   PROT 7.0 01/08/2024 0914   ALBUMIN 4.5 01/08/2024 0914   AST 16 05/01/2024 0857   ALT 11 05/01/2024 0857   ALKPHOS 53 01/08/2024 0914   BILITOT 0.4 05/01/2024 0857   BILITOT 0.3 01/08/2024 0914   BILIDIR 0.1 10/24/2022 1008   IBILI 0.5 10/24/2022 1008      Component Value Date/Time   TSH 1.850 01/08/2024 0914   Nutritional Lab Results  Component Value Date   VD25OH 26.9 (L) 01/08/2024   VD25OH 25.8 (L) 09/05/2022   VD25OH 29.9 (L) 07/20/2020     ASSESSMENT AND PLAN  TREATMENT PLAN FOR OBESITY:  Recommended Dietary Goals  Alex Mitchell is currently in the action stage of change. As such, his goal is to continue weight management plan. He has agreed to keeping a food journal and adhering to recommended goals of 1800-2000 calories and 100+ grams of protein.  Had a long discussing today about the importance of meeting calories and protein goals, not skipping meals.  Ideas of foods to pack to take to work was discussed.  He's a driver for Alex Mitchell.  Several handouts given.    Behavioral Intervention  We discussed the following Behavioral Modification Strategies today: increasing lean protein intake to established goals, decreasing simple carbohydrates , increasing vegetables, increasing fiber rich foods, avoiding skipping meals, increasing water intake , work on meal planning and preparation, reading food labels , keeping healthy foods at home, identifying sources and decreasing liquid calories, continue to practice mindfulness when eating, planning for success, better snacking choices, and continue to work on maintaining a reduced calorie state, getting the recommended amount of protein, incorporating whole foods, making healthy choices, staying well hydrated  and practicing mindfulness when eating..  Additional resources provided today: protein shakes, protein contents of food, discussed extensively how to reach a food label (aim for single digit sugars and double digit carbs)  Recommended Physical Activity Goals  Alex Mitchell has been advised to work up to 150 minutes of moderate intensity aerobic activity a week and strengthening exercises 2-3 times per week for cardiovascular health, weight loss maintenance and preservation of muscle mass.   He has agreed to Think about enjoyable ways to increase daily physical activity and overcoming barriers to exercise, Increase physical activity in their day and reduce sedentary time (increase NEAT)., and continue to gradually increase  the amount and intensity of exercise routine   Pharmacotherapy We discussed various medication options to help Kamarie with his weight loss efforts and we both agreed to continue Phentermine  30mg  and topamax  50mg  BID.  Side effects discussed.  ASSOCIATED CONDITIONS ADDRESSED TODAY  Action/Plan  Polyphagia -     Phentermine  HCl; Take 1 capsule (30 mg total) by mouth every morning.  Dispense: 30 capsule; Refill: 0  Vitamin D  insufficiency -     Vitamin D  (Ergocalciferol ); Take 1 capsule (50,000 Units total) by mouth every 7 (seven) days.  Dispense: 5 capsule; Refill: 0  Other disorder of eating -     Topiramate ; Take 1 tablet (50 mg total) by mouth 2 (two) times daily.  Dispense: 60 tablet; Refill: 0  Class 2 severe obesity due to excess calories with serious comorbidity and body mass index (BMI) of 35.0 to 35.9 in adult [facility]) -     Phentermine  HCl; Take 1 capsule (30 mg total) by mouth every morning.  Dispense: 30 capsule; Refill: 0 -     Topiramate ; Take 1 tablet (50 mg total) by mouth 2 (two) times daily.  Dispense: 60 tablet; Refill: 0         Return in about 3 weeks (around 05/27/2024).Aaron Aas He was informed of the importance of frequent follow up visits to maximize his  success with intensive lifestyle modifications for his multiple health conditions.   ATTESTASTION STATEMENTS:  Reviewed by clinician on day of visit: allergies, medications, problem list, medical history, surgical history, family history, social history, and previous encounter notes.     Crist Dominion. Neeley Sedivy FNP-C

## 2024-05-15 ENCOUNTER — Other Ambulatory Visit: Payer: Self-pay

## 2024-05-15 ENCOUNTER — Telehealth: Payer: Self-pay | Admitting: Infectious Diseases

## 2024-05-15 DIAGNOSIS — N1831 Chronic kidney disease, stage 3a: Secondary | ICD-10-CM

## 2024-05-15 DIAGNOSIS — Z21 Asymptomatic human immunodeficiency virus [HIV] infection status: Secondary | ICD-10-CM

## 2024-05-15 DIAGNOSIS — N189 Chronic kidney disease, unspecified: Secondary | ICD-10-CM

## 2024-05-15 DIAGNOSIS — B20 Human immunodeficiency virus [HIV] disease: Secondary | ICD-10-CM

## 2024-05-15 MED ORDER — DOVATO 50-300 MG PO TABS: 1.0000 | ORAL_TABLET | Freq: Every day | ORAL | 11 refills | Status: DC

## 2024-05-15 NOTE — Progress Notes (Signed)
 Name: Alex Mitchell  DOB: 1980/11/22 MRN: [id number] PCP: Paseda, Folashade R, FNP   VIRTUAL CARE ENCOUNTER  I connected with Alex Mitchell on 05/15/24 at  8:45 AM EDT by video and verified that I am speaking with the correct person using two identifiers.   I discussed the limitations, risks, security and privacy concerns of performing an evaluation and management service by telephone and the availability of in person appointments. I also discussed with the patient that there may be a patient responsible charge related to this service. The patient expressed understanding and agreed to proceed.  Patient Location: [place] residence  Other Participants: none  Provider Location: [facility] Office   Brief Narrative:  Alex Mitchell is a 43 y.o. 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  History of OIs: PJP pna, esophageal candidiasis   Previous Regimens: Biktarvy  09-2018  Dovato     Genotypes: Not performed in hospital   Subjective    Subjective:   CC: Routine Follow up care     Discussed the use of AI scribe software for clinical note transcription with the patient, who gave verbal consent to proceed.  History of Present Illness   Alex Mitchell is a 43 year old male with HIV and chronic kidney disease who presents for routine follow-up of HIV care.  Alex Mitchell is on a once-daily regimen of Dovato  for HIV management, which is suitable given his chronic kidney disease. His recent creatinine level is 2.03. Alex Mitchell has been on HIV treatment for almost six years, during which his immune system has recovered well, and his viral loads have remained undetectable.  Alex Mitchell is interested in Cabenuva  injections as an alternative to his current oral medication. Alex Mitchell has read that the injections are more potent and Alex Mitchell feels they may provide additional benefit for his treatment. His chronic kidney disease has been stable, but there is a noted upward  trend in creatinine levels since 2021.  Alex Mitchell is currently employed and finds the volume of work overwhelming at times, but Alex Mitchell manages to stay active. 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        Review of Systems  All other systems reviewed and are negative.      Outpatient Medications Prior to Visit  Medication Sig Dispense Refill   albuterol  (VENTOLIN  HFA) 108 (90 Base) MCG/ACT inhaler Inhale 1-2 puffs into the lungs every 6 (six) hours as needed. (Patient not taking: Reported on 05/06/2024) 8 g 0   atorvastatin  (LIPITOR) 20 MG tablet Take 1 tablet (20 mg total) by mouth daily. (Patient not taking: Reported on 05/06/2024) 90 tablet 1   cetirizine  (ZYRTEC ) 10 MG tablet Take 1 tablet (10 mg total) by mouth daily. 30 tablet 11   Cranberry 250 MG CHEW Chew by mouth. (Patient not taking: Reported on 05/06/2024)     dapagliflozin  propanediol (FARXIGA ) 10 MG TABS tablet Take 1 tablet (10 mg total) by mouth daily before breakfast. 90 tablet 1   doxycycline  (VIBRA -TABS) 100 MG tablet Take 2 tablets (200 mg total) by mouth See admin instructions. Within 24-72 hours of unprotected oral or genital sex. 20 tablet 1   fluticasone  (FLONASE ) 50 MCG/ACT nasal spray Place 2 sprays into both nostrils daily. 16 g 0   phentermine  30 MG capsule Take 1 capsule (30 mg total) by mouth every morning. 30 capsule 0   topiramate  (TOPAMAX ) 50 MG tablet  Take 1 tablet (50 mg total) by mouth 2 (two) times daily. 60 tablet 0   Vitamin D , Ergocalciferol , (DRISDOL ) 1.25 MG (50000 UNIT) CAPS capsule Take 1 capsule (50,000 Units total) by mouth every 7 (seven) days. 5 capsule 0   dolutegravir-lamiVUDine (DOVATO ) 50-300 MG tablet Take 1 tablet by mouth daily. 30 tablet 5   No facility-administered medications prior to visit.     No Known Allergies  Social History   Tobacco Use   Smoking status: Never   Smokeless tobacco: Never  Vaping Use   Vaping  status: Never Used  Substance Use Topics   Alcohol use: Not Currently   Drug use: No    Social History   Substance and Sexual Activity  Sexual Activity Not Currently     Objective    Objective:   There were no vitals filed for this visit.    There is no height or weight on file to calculate BMI.   Physical Exam Constitutional:      Appearance: Normal appearance. Alex Mitchell is not ill-appearing.  HENT:     Head: Normocephalic.     Mouth/Throat:     Mouth: Mucous membranes are moist.     Pharynx: Oropharynx is clear.   Eyes:     General: No scleral icterus.  Pulmonary:     Effort: Pulmonary effort is normal.   Musculoskeletal:        General: Normal range of motion.     Cervical back: Normal range of motion.   Skin:    Coloration: Skin is not jaundiced or pale.   Neurological:     Mental Status: Alex Mitchell is alert and oriented to person, place, and time.   Psychiatric:        Mood and Affect: Mood normal.        Judgment: Judgment normal.      Lab Results Lab Results  Component Value Date   WBC 5.8 05/01/2024   HGB 12.6 (L) 05/01/2024   HCT 39.2 05/01/2024   MCV 100.3 (H) 05/01/2024   PLT 184 05/01/2024    Lab Results  Component Value Date   CREATININE 2.03 (H) 05/01/2024   BUN 18 05/01/2024   NA 135 05/01/2024   K 4.0 05/01/2024   CL 107 05/01/2024   CO2 21 05/01/2024    Lab Results  Component Value Date   ALT 11 05/01/2024   AST 16 05/01/2024   ALKPHOS 53 01/08/2024   BILITOT 0.4 05/01/2024    Lab Results  Component Value Date   CHOL 204 (H) 01/08/2024   HDL 54 01/08/2024   LDLCALC 143 (H) 01/08/2024   TRIG 41 01/08/2024   CHOLHDL 3.3 08/28/2023   HIV 1 RNA Quant  Date Value  05/01/2024 NOT DETECTED copies/mL  10/23/2023 Not Detected Copies/mL  05/01/2023 <20 Copies/mL (H)   CD4 T Cell Abs (/uL)  Date Value  10/23/2023 375 (L)  10/24/2022 424  08/24/2021 442      Assessment & Plan:     HIV infection - HIV infection is  well-controlled with Dovato , maintaining undetectable viral loads and good immune recovery over six years. Alex Mitchell considered Cabenuva  injections but opted to continue Dovato  for now after out discussion about pros/cons of both options. I think Alex Mitchell is on a great treatment option that is providing exceptional results. Alex Mitchell may be interested in some of the once weekly PO options coming out - more to come there.  - Continue Dovato  once daily. - Monitor viral loads to  ensure Alex Mitchell remains undetectable. - Discuss potential future options, such as a once-weekly pill regimen, as Alex Mitchell becomes available. - Schedule anal rectal cancer screening in December. - Administer last pneumonia vaccine in 2026.  Chronic kidney disease, unspecified - Chronic kidney disease with a recent creatinine level of 2.03, slightly elevated from previous levels, requiring regular monitoring, but in the broader aspect of time seems fairly consistent (1.7 - 2.0) since 2021.  - Follow up with nephrologist if not seen in the last year. - Monitor kidney function regularly. - Stay off TAF/TDF if we can to remove that variable.   Recording duration: 20 minutes      Meds ordered this encounter  Medications   dolutegravir-lamiVUDine (DOVATO ) 50-300 MG tablet    Sig: Take 1 tablet by mouth daily.    Dispense:  30 tablet    Refill:  11   No orders of the defined types were placed in this encounter.  11/06/2024 scheduled for in person appt. Labs and rectal cancer screening same day   Corean Fireman, MSN, NP-C [facility]  [email or web address] Pager: [phone] Office: [phone] [facility] Main Line: [phone]

## 2024-05-18 ENCOUNTER — Other Ambulatory Visit: Payer: Self-pay

## 2024-05-27 ENCOUNTER — Ambulatory Visit: Admitting: Nurse Practitioner

## 2024-05-27 ENCOUNTER — Other Ambulatory Visit: Payer: Self-pay

## 2024-06-07 ENCOUNTER — Other Ambulatory Visit: Payer: Self-pay | Admitting: Nurse Practitioner

## 2024-06-07 DIAGNOSIS — E559 Vitamin D deficiency, unspecified: Secondary | ICD-10-CM

## 2024-06-22 ENCOUNTER — Other Ambulatory Visit: Payer: Self-pay

## 2024-06-22 ENCOUNTER — Other Ambulatory Visit (HOSPITAL_COMMUNITY): Payer: Self-pay

## 2024-06-22 MED ORDER — DOVATO 50-300 MG PO TABS
1.0000 | ORAL_TABLET | Freq: Every day | ORAL | 0 refills | Status: DC
  Filled 2024-06-22: qty 30, 30d supply, fill #0

## 2024-06-22 NOTE — Progress Notes (Signed)
 Patient to be enrolled with [facility]. Routed to Tiffany.  Rejection: NEXT AVAILABLE FILL DATE [id number] LAST FILL DT [id number] FILLED AT PHARMACY [facility] [id number],PHONE #[phone]

## 2024-06-25 ENCOUNTER — Other Ambulatory Visit (HOSPITAL_COMMUNITY): Payer: Self-pay

## 2024-06-26 ENCOUNTER — Other Ambulatory Visit (HOSPITAL_COMMUNITY): Payer: Self-pay

## 2024-06-29 ENCOUNTER — Other Ambulatory Visit: Payer: Self-pay

## 2024-06-29 NOTE — Progress Notes (Signed)
 Last filled on 8/11 at [facility]. Next available fill date is 9/4.

## 2024-07-23 ENCOUNTER — Other Ambulatory Visit: Payer: Self-pay

## 2024-07-23 ENCOUNTER — Other Ambulatory Visit (HOSPITAL_COMMUNITY): Payer: Self-pay

## 2024-07-23 ENCOUNTER — Other Ambulatory Visit: Payer: Self-pay | Admitting: Pharmacist

## 2024-07-23 DIAGNOSIS — Z21 Asymptomatic human immunodeficiency virus [HIV] infection status: Secondary | ICD-10-CM

## 2024-07-23 MED ORDER — DOVATO 50-300 MG PO TABS
1.0000 | ORAL_TABLET | Freq: Every day | ORAL | 5 refills | Status: DC
  Filled 2024-07-23: qty 30, 30d supply, fill #0
  Filled 2024-08-17: qty 30, 30d supply, fill #1
  Filled 2024-09-17 – 2024-09-24 (×3): qty 30, 30d supply, fill #2
  Filled 2024-10-16: qty 30, 30d supply, fill #3
  Filled 2024-10-19: qty 90, 90d supply, fill #3

## 2024-07-23 NOTE — Progress Notes (Signed)
 Specialty Pharmacy Initiation Note   Alex Mitchell is a 43 y.o. male who will be followed by the specialty pharmacy service for RxSp HIV    Review of administration, indication, effectiveness, safety, potential side effects, storage/disposable, and missed dose instructions occurred today for patient's specialty medication(s) Dolutegravir -lamiVUDine  (Dovato )     Patient/Caregiver did not have any additional questions or concerns.   Patient's therapy is appropriate to: Continue    Goals Addressed             This Visit's Progress    Achieve Undetectable HIV Viral Load < 20       Patient is on track. Patient will maintain adherence      Comply with lab assessments       Patient is on track. Patient will maintain adherence      Maintain optimal adherence to therapy       Patient is on track. Patient will maintain adherence         Alan JINNY Geralds Specialty Pharmacist

## 2024-07-23 NOTE — Progress Notes (Signed)
 Pharmacy Patient Advocate Encounter  Insurance verification completed.   The patient is insured through [facility]   Ran test claim for Dovato . Co-pay is $0.  This test claim was processed through [facility]- copay amounts may vary at other pharmacies due to pharmacy/plan contracts, or as the patient moves through the different stages of their insurance plan.

## 2024-07-23 NOTE — Progress Notes (Signed)
 Specialty Pharmacy Initial Fill Coordination Note  Alex Mitchell is a 43 y.o. male contacted today regarding initial fill of specialty medication(s) Dolutegravir -lamiVUDine  (Dovato )   Patient requested Delivery   Delivery date: 07/27/24   Verified address: [address] [place] [address]   Medication will be filled on 07/24/24.   Patient is aware of 0.00 copayment.

## 2024-07-24 ENCOUNTER — Other Ambulatory Visit: Payer: Self-pay

## 2024-08-14 ENCOUNTER — Encounter (INDEPENDENT_AMBULATORY_CARE_PROVIDER_SITE_OTHER): Payer: Self-pay

## 2024-08-17 ENCOUNTER — Other Ambulatory Visit: Payer: Self-pay

## 2024-08-17 ENCOUNTER — Other Ambulatory Visit: Payer: Self-pay | Admitting: Pharmacy Technician

## 2024-08-17 NOTE — Progress Notes (Signed)
 Specialty Pharmacy Refill Coordination Note  Alex Mitchell is a 43 y.o. male contacted today regarding refills of specialty medication(s) Dolutegravir -lamiVUDine  (Dovato )   Patient requested No data recorded  Delivery date: 08/26/24 Verified address: [address]   Medication will be filled on 08/25/24.   Patient answered questionnaire

## 2024-08-25 ENCOUNTER — Other Ambulatory Visit: Payer: Self-pay

## 2024-08-25 NOTE — Progress Notes (Signed)
 Insurance rejecting for filled [facility] 08/20/24. LVM for patient.

## 2024-09-16 ENCOUNTER — Encounter: Payer: Self-pay | Admitting: Nurse Practitioner

## 2024-09-16 ENCOUNTER — Ambulatory Visit (INDEPENDENT_AMBULATORY_CARE_PROVIDER_SITE_OTHER): Admitting: Nurse Practitioner

## 2024-09-16 VITALS — BP 135/76 | HR 71 | Temp 98.1°F | Ht 68.0 in | Wt 250.0 lb

## 2024-09-16 DIAGNOSIS — E559 Vitamin D deficiency, unspecified: Secondary | ICD-10-CM

## 2024-09-16 DIAGNOSIS — D508 Other iron deficiency anemias: Secondary | ICD-10-CM

## 2024-09-16 DIAGNOSIS — R7303 Prediabetes: Secondary | ICD-10-CM | POA: Diagnosis not present

## 2024-09-16 DIAGNOSIS — E7849 Other hyperlipidemia: Secondary | ICD-10-CM

## 2024-09-16 DIAGNOSIS — E669 Obesity, unspecified: Secondary | ICD-10-CM

## 2024-09-16 DIAGNOSIS — Z79899 Other long term (current) drug therapy: Secondary | ICD-10-CM

## 2024-09-16 DIAGNOSIS — N1831 Chronic kidney disease, stage 3a: Secondary | ICD-10-CM

## 2024-09-16 DIAGNOSIS — Z6838 Body mass index (BMI) 38.0-38.9, adult: Secondary | ICD-10-CM

## 2024-09-16 NOTE — Progress Notes (Signed)
 Office: [phone]  /  Fax: [phone]  WEIGHT SUMMARY AND BIOMETRICS  Weight Lost Since Last Visit: 0lb  Weight Gained Since Last Visit: 15lb   Vitals Temp: 98.1 F (36.7 C) BP: 135/76 Pulse Rate: 71 SpO2: 99 %   Anthropometric Measurements Height: 5' 8 (1.727 m) Weight: 250 lb (113.4 kg) BMI (Calculated): 38.02 Weight at Last Visit: 235lb Weight Lost Since Last Visit: 0lb Weight Gained Since Last Visit: 15lb Starting Weight: 242lb Total Weight Loss (lbs): 0 lb (0 kg)   Body Composition  Body Fat %: 30.8 % Fat Mass (lbs): 77 lbs Muscle Mass (lbs): 164.6 lbs Total Body Water (lbs): 118 lbs Visceral Fat Rating : 17   Other Clinical Data Fasting: No Labs: No Today's Visit #: 5 Starting Date: 01/08/24     HPI  Chief Complaint: OBESITY  Alex Mitchell is here to discuss his progress with his obesity treatment plan. He is on the the Category 4 Plan and states he is following his eating plan approximately 0 % of the time. He states he is exercising 60+ minutes 4-5 days per week.   Interval History:  Since last office visit on 05/06/24 he has gained 15 pounds.  He notes that he has gotten off track since his last visit.  He celebrated his birthday yesterday.  He is back today to get back on track. He is drinking water and sparkling water daily.  He is walking and jogging to stay active.    Pharmacotherapy for weight loss: He is not currently taking medications  for medical weight loss. Stopped taking Phentermine  and Topamax  since his last visit.     Previous pharmacotherapy for medical weight loss:  Phentermine  37.5-side effects headaches   Bariatric surgery:  Patient has not had bariatric surgery.  Hyperlipidemia Medication(s): lipitor 20mg -takes once per week due to side effects of headaches.  Cardiovascular risk factors: dyslipidemia, male gender, and obesity (BMI >= 30 kg/m2) FH:  mother and father  Lab Results  Component Value Date   CHOL 204 (H)  01/08/2024   HDL 54 01/08/2024   LDLCALC 143 (H) 01/08/2024   TRIG 41 01/08/2024   CHOLHDL 3.3 08/28/2023   Lab Results  Component Value Date   ALT 11 05/01/2024   AST 16 05/01/2024   ALKPHOS 53 01/08/2024   BILITOT 0.4 05/01/2024   The 10-year ASCVD risk score (Arnett DK, et al., 2019) is: 4%   Values used to calculate the score:     Age: 43 years     Clincally relevant sex: Male     Is Non-Hispanic African American: Yes     Diabetic: No     Tobacco smoker: No     Systolic Blood Pressure: 135 mmHg     Is BP treated: No     HDL Cholesterol: 54 mg/dL     Total Cholesterol: 204 mg/dL   Vit D deficiency  He is taking Vit D 50,000 IU weekly.  Denies side effects.  Denies nausea, vomiting or muscle weakness.    Lab Results  Component Value Date   VD25OH 26.9 (L) 01/08/2024   VD25OH 25.8 (L) 09/05/2022   VD25OH 29.9 (L) 07/20/2020     Iron def anemia He is taking an iron daily.  Denies fatigue.  Denies hematuria or blood in his stool.  Never had an EGD or colonoscopy.    Prediabetes Last A1c was 5.8  Medication(s): none Polyphagia:Yes Lab Results  Component Value Date   HGBA1C 5.8 (H) 01/08/2024  HGBA1C 5.8 (H) 10/24/2022   HGBA1C 5.2 12/30/2019   HGBA1C 5.4 11/05/2018   Lab Results  Component Value Date   INSULIN  9.2 01/08/2024   CKD Seeing nephrology on a regular basis  PHYSICAL EXAM:  Blood pressure 135/76, pulse 71, temperature 98.1 F (36.7 C), height 5' 8 (1.727 m), weight 250 lb (113.4 kg), SpO2 99%. Body mass index is 38.01 kg/m.  General: He is overweight, cooperative, alert, well developed, and in no acute distress. PSYCH: Has normal mood, affect and thought process.   Extremities: No edema.  Neurologic: No gross sensory or motor deficits. No tremors or fasciculations noted.    DIAGNOSTIC DATA REVIEWED:  BMET    Component Value Date/Time   NA 135 05/01/2024 0857   NA 137 01/08/2024 0914   K 4.0 05/01/2024 0857   CL 107 05/01/2024 0857    CO2 21 05/01/2024 0857   GLUCOSE 93 05/01/2024 0857   BUN 18 05/01/2024 0857   BUN 22 01/08/2024 0914   CREATININE 2.03 (H) 05/01/2024 0857   CALCIUM  9.2 05/01/2024 0857   GFRNONAA 48 (L) 12/07/2019 0949   GFRAA 55 (L) 12/07/2019 0949   Lab Results  Component Value Date   HGBA1C 5.8 (H) 01/08/2024   HGBA1C 5.4 11/05/2018   Lab Results  Component Value Date   INSULIN  9.2 01/08/2024   Lab Results  Component Value Date   TSH 1.850 01/08/2024   CBC    Component Value Date/Time   WBC 5.8 05/01/2024 0857   RBC 3.91 (L) 05/01/2024 0857   HGB 12.6 (L) 05/01/2024 0857   HGB 12.7 (L) 05/01/2023 0951   HCT 39.2 05/01/2024 0857   HCT 37.9 01/08/2024 0914   PLT 184 05/01/2024 0857   PLT 211 05/01/2023 0951   MCV 100.3 (H) 05/01/2024 0857   MCV 97 05/01/2023 0951   MCH 32.2 05/01/2024 0857   MCHC 32.1 05/01/2024 0857   RDW 13.4 05/01/2024 0857   RDW 13.3 05/01/2023 0951   Iron Studies    Component Value Date/Time   IRON 77 01/08/2024 0914   TIBC 347 01/08/2024 0914   FERRITIN 249 01/08/2024 0914   IRONPCTSAT 22 01/08/2024 0914   IRONPCTSAT 19 (L) 08/13/2019 1031   Lipid Panel     Component Value Date/Time   CHOL 204 (H) 01/08/2024 0914   TRIG 41 01/08/2024 0914   HDL 54 01/08/2024 0914   CHOLHDL 3.3 08/28/2023 0933   CHOLHDL 4.5 05/01/2023 0951   LDLCALC 143 (H) 01/08/2024 0914   LDLCALC 161 (H) 05/01/2023 0951   Hepatic Function Panel     Component Value Date/Time   PROT 6.9 05/01/2024 0857   PROT 7.0 01/08/2024 0914   ALBUMIN 4.5 01/08/2024 0914   AST 16 05/01/2024 0857   ALT 11 05/01/2024 0857   ALKPHOS 53 01/08/2024 0914   BILITOT 0.4 05/01/2024 0857   BILITOT 0.3 01/08/2024 0914   BILIDIR 0.1 10/24/2022 1008   IBILI 0.5 10/24/2022 1008      Component Value Date/Time   TSH 1.850 01/08/2024 0914   Nutritional Lab Results  Component Value Date   VD25OH 26.9 (L) 01/08/2024   VD25OH 25.8 (L) 09/05/2022   VD25OH 29.9 (L) 07/20/2020      ASSESSMENT AND PLAN  TREATMENT PLAN FOR OBESITY:  Recommended Dietary Goals  Alex Mitchell is currently in the action stage of change. As such, his goal is to continue weight management plan. He has agreed to practicing portion control and making smarter food choices, such  as increasing vegetables and decreasing simple carbohydrates.  Behavioral Intervention  We discussed the following Behavioral Modification Strategies today: increasing lean protein intake to established goals, decreasing simple carbohydrates , increasing vegetables, increasing lower glycemic fruits, avoiding skipping meals, increasing water intake , work on meal planning and preparation, reading food labels , keeping healthy foods at home, planning for success, continue to work on maintaining a reduced calorie state, getting the recommended amount of protein, incorporating whole foods, making healthy choices, staying well hydrated and practicing mindfulness when eating., and increase protein intake, fibrous foods (25 grams per day for women, 30 grams for men) and water to improve satiety and decrease hunger signals. .  Additional resources provided today: NA  Recommended Physical Activity Goals  Alex Mitchell has been advised to work up to 150 minutes of moderate intensity aerobic activity a week and strengthening exercises 2-3 times per week for cardiovascular health, weight loss maintenance and preservation of muscle mass.   He has agreed to Think about enjoyable ways to increase daily physical activity and overcoming barriers to exercise, Increase physical activity in their day and reduce sedentary time (increase NEAT)., Start strengthening exercises with a goal of 2-3 sessions a week , Start aerobic activity with a goal of 150 minutes a week at moderate intensity. , Increase volume of physical activity to a goal of 240 minutes a week, and Combine aerobic and strengthening exercises for efficiency and improved cardiometabolic  health.   Pharmacotherapy We discussed various medication options to help Jakyren with his weight loss efforts and we both agreed to consider his options after lab results.  ASSOCIATED CONDITIONS ADDRESSED TODAY  Action/Plan  Other hyperlipidemia Patient is not taking his Lipitor on a daily basis.  I've recommended that he take it daily as directed  Vitamin D  insufficiency -     VITAMIN D  25 Hydroxy (Vit-D Deficiency, Fractures)  Prediabetes -     Hemoglobin A1c  Stage 3a chronic kidney disease (HCC) -     Comprehensive metabolic panel with GFR  Other iron deficiency anemia -     Ferritin -     Iron -     CBC with Differential/Platelet  Medication management -     Comprehensive metabolic panel with GFR   Labs obtained today.  Will discuss next best plan of care at his next visit.      Return in about 2 weeks (around 09/30/2024).SABRA He was informed of the importance of frequent follow up visits to maximize his success with intensive lifestyle modifications for his multiple health conditions.   ATTESTASTION STATEMENTS:  Reviewed by clinician on day of visit: allergies, medications, problem list, medical history, surgical history, family history, social history, and previous encounter notes.     Alex Mitchell. Raun Routh FNP-C

## 2024-09-17 ENCOUNTER — Other Ambulatory Visit: Payer: Self-pay

## 2024-09-17 LAB — CBC WITH DIFFERENTIAL/PLATELET
Basophils Absolute: 0 x10E3/uL (ref 0.0–0.2)
Basos: 1 %
EOS (ABSOLUTE): 0.4 x10E3/uL (ref 0.0–0.4)
Eos: 6 %
Hematocrit: 39.7 % (ref 37.5–51.0)
Hemoglobin: 13.2 g/dL (ref 13.0–17.7)
Immature Grans (Abs): 0 x10E3/uL (ref 0.0–0.1)
Immature Granulocytes: 0 %
Lymphocytes Absolute: 2.5 x10E3/uL (ref 0.7–3.1)
Lymphs: 43 %
MCH: 33.2 pg — ABNORMAL HIGH (ref 26.6–33.0)
MCHC: 33.2 g/dL (ref 31.5–35.7)
MCV: 100 fL — ABNORMAL HIGH (ref 79–97)
Monocytes Absolute: 0.6 x10E3/uL (ref 0.1–0.9)
Monocytes: 10 %
Neutrophils Absolute: 2.3 x10E3/uL (ref 1.4–7.0)
Neutrophils: 40 %
Platelets: 209 x10E3/uL (ref 150–450)
RBC: 3.97 x10E6/uL — ABNORMAL LOW (ref 4.14–5.80)
RDW: 13.1 % (ref 11.6–15.4)
WBC: 5.7 x10E3/uL (ref 3.4–10.8)

## 2024-09-17 LAB — COMPREHENSIVE METABOLIC PANEL WITH GFR
ALT: 15 IU/L (ref 0–44)
AST: 20 IU/L (ref 0–40)
Albumin: 4.7 g/dL (ref 4.1–5.1)
Alkaline Phosphatase: 49 IU/L (ref 47–123)
BUN/Creatinine Ratio: 10 (ref 9–20)
BUN: 19 mg/dL (ref 6–24)
Bilirubin Total: 0.3 mg/dL (ref 0.0–1.2)
CO2: 23 mmol/L (ref 20–29)
Calcium: 9.7 mg/dL (ref 8.7–10.2)
Chloride: 102 mmol/L (ref 96–106)
Creatinine, Ser: 1.82 mg/dL — ABNORMAL HIGH (ref 0.76–1.27)
Globulin, Total: 2.3 g/dL (ref 1.5–4.5)
Glucose: 85 mg/dL (ref 70–99)
Potassium: 4.6 mmol/L (ref 3.5–5.2)
Sodium: 140 mmol/L (ref 134–144)
Total Protein: 7 g/dL (ref 6.0–8.5)
eGFR: 47 mL/min/1.73 — ABNORMAL LOW (ref >59)

## 2024-09-17 LAB — FERRITIN: Ferritin: 268 ng/mL (ref 30–400)

## 2024-09-17 LAB — HEMOGLOBIN A1C
Est. average glucose Bld gHb Est-mCnc: 114 mg/dL
Hgb A1c MFr Bld: 5.6 % (ref 4.8–5.6)

## 2024-09-17 LAB — IRON: Iron: 77 ug/dL (ref 38–169)

## 2024-09-17 LAB — VITAMIN D 25 HYDROXY (VIT D DEFICIENCY, FRACTURES): Vit D, 25-Hydroxy: 32.8 ng/mL (ref 30.0–100.0)

## 2024-09-18 ENCOUNTER — Other Ambulatory Visit: Payer: Self-pay

## 2024-09-22 ENCOUNTER — Other Ambulatory Visit (HOSPITAL_COMMUNITY): Payer: Self-pay

## 2024-09-24 ENCOUNTER — Other Ambulatory Visit (HOSPITAL_COMMUNITY): Payer: Self-pay

## 2024-09-24 ENCOUNTER — Other Ambulatory Visit: Payer: Self-pay

## 2024-09-24 NOTE — Progress Notes (Signed)
 Specialty Pharmacy Refill Coordination Note  Alex Mitchell is a 43 y.o. male contacted today regarding refills of specialty medication(s) Dolutegravir -lamiVUDine  (Dovato )   Patient requested Delivery   Delivery date: 09/25/24   Verified address: [address], [place]   Medication will be filled on: 09/24/24

## 2024-09-30 ENCOUNTER — Encounter: Payer: Self-pay | Admitting: Nurse Practitioner

## 2024-09-30 ENCOUNTER — Telehealth: Payer: Self-pay

## 2024-09-30 ENCOUNTER — Ambulatory Visit (INDEPENDENT_AMBULATORY_CARE_PROVIDER_SITE_OTHER): Admitting: Nurse Practitioner

## 2024-09-30 ENCOUNTER — Ambulatory Visit: Admitting: Bariatrics

## 2024-09-30 VITALS — BP 131/84 | HR 60 | Temp 98.1°F | Ht 68.0 in | Wt 248.0 lb

## 2024-09-30 DIAGNOSIS — E66812 Obesity, class 2: Secondary | ICD-10-CM | POA: Diagnosis not present

## 2024-09-30 DIAGNOSIS — E559 Vitamin D deficiency, unspecified: Secondary | ICD-10-CM

## 2024-09-30 DIAGNOSIS — Z6837 Body mass index (BMI) 37.0-37.9, adult: Secondary | ICD-10-CM

## 2024-09-30 MED ORDER — VITAMIN D (ERGOCALCIFEROL) 1.25 MG (50000 UNIT) PO CAPS: 50000.0000 [IU] | ORAL_CAPSULE | ORAL | 0 refills | Status: AC

## 2024-09-30 MED ORDER — WEGOVY 0.25 MG/0.5ML ~~LOC~~ SOAJ: 0.2500 mg | SUBCUTANEOUS | 0 refills | Status: DC

## 2024-09-30 NOTE — Progress Notes (Addendum)
 Office: [phone]  /  Fax: [phone]  WEIGHT SUMMARY AND BIOMETRICS  Weight Lost Since Last Visit: 2lb  Weight Gained Since Last Visit: 0lb   Vitals Temp: 98.1 F (36.7 C) BP: 131/84 Pulse Rate: 60 SpO2: 98 %   Anthropometric Measurements Height: 5' 8 (1.727 m) Weight: 248 lb (112.5 kg) BMI (Calculated): 37.72 Weight at Last Visit: 250lb Weight Lost Since Last Visit: 2lb Weight Gained Since Last Visit: 0lb Starting Weight: 242lb Total Weight Loss (lbs): 0 lb (0 kg)   Body Composition  Body Fat %: 31 % Fat Mass (lbs): 77.2 lbs Muscle Mass (lbs): 163.2 lbs Total Body Water (lbs): 117.8 lbs Visceral Fat Rating : 17   Other Clinical Data Fasting: No Labs: No Today's Visit #: 6 Starting Date: 01/08/24     HPI  Chief Complaint: OBESITY  Alex Mitchell is here to discuss his progress with his obesity treatment plan. He is on the the Category 4 Plan and states he is following his eating plan approximately 50 % of the time. He states he is exercising 60+ minutes 4-5 days per week.   Interval History:  Since last office visit he has lost 2 pounds.  He has been drinking more water and started drinking a protein coffee.  He is walking/jogging and working at [employer] to stay active.  He started doing 50 jumping jacks per day.  He is struggling with polyphagia and cravings.   Pharmacotherapy for weight loss: He is not currently taking medications  for medical weight loss.   Previous pharmacotherapy for medical weight loss:  Phentermine  37.5-side effects headaches and Topamax    Bariatric surgery:  Patient has not had bariatric surgery.  Vit D deficiency  He is not currently taking Vit D    Lab Results  Component Value Date   VD25OH 32.8 09/16/2024   VD25OH 26.9 (L) 01/08/2024   VD25OH 25.8 (L) 09/05/2022     PHYSICAL EXAM:  Blood pressure 131/84, pulse 60, temperature 98.1 F (36.7 C), height 5' 8 (1.727 m), weight 248 lb (112.5 kg), SpO2 98%. Body mass  index is 37.71 kg/m.  General: He is overweight, cooperative, alert, well developed, and in no acute distress. PSYCH: Has normal mood, affect and thought process.   Extremities: No edema.  Neurologic: No gross sensory or motor deficits. No tremors or fasciculations noted.    DIAGNOSTIC DATA REVIEWED:  BMET    Component Value Date/Time   NA 140 09/16/2024 1537   K 4.6 09/16/2024 1537   CL 102 09/16/2024 1537   CO2 23 09/16/2024 1537   GLUCOSE 85 09/16/2024 1537   GLUCOSE 93 05/01/2024 0857   BUN 19 09/16/2024 1537   CREATININE 1.82 (H) 09/16/2024 1537   CREATININE 2.03 (H) 05/01/2024 0857   CALCIUM  9.7 09/16/2024 1537   GFRNONAA 48 (L) 12/07/2019 0949   GFRAA 55 (L) 12/07/2019 0949   Lab Results  Component Value Date   HGBA1C 5.6 09/16/2024   HGBA1C 5.4 11/05/2018   Lab Results  Component Value Date   INSULIN  9.2 01/08/2024   Lab Results  Component Value Date   TSH 1.850 01/08/2024   CBC    Component Value Date/Time   WBC 5.7 09/16/2024 1537   WBC 5.8 05/01/2024 0857   RBC 3.97 (L) 09/16/2024 1537   RBC 3.91 (L) 05/01/2024 0857   HGB 13.2 09/16/2024 1537   HCT 39.7 09/16/2024 1537   PLT 209 09/16/2024 1537   MCV 100 (H) 09/16/2024 1537   MCH 33.2 (  H) 09/16/2024 1537   MCH 32.2 05/01/2024 0857   MCHC 33.2 09/16/2024 1537   MCHC 32.1 05/01/2024 0857   RDW 13.1 09/16/2024 1537   Iron Studies    Component Value Date/Time   IRON 77 09/16/2024 1537   TIBC 347 01/08/2024 0914   FERRITIN 268 09/16/2024 1537   IRONPCTSAT 22 01/08/2024 0914   IRONPCTSAT 19 (L) 08/13/2019 1031   Lipid Panel     Component Value Date/Time   CHOL 204 (H) 01/08/2024 0914   TRIG 41 01/08/2024 0914   HDL 54 01/08/2024 0914   CHOLHDL 3.3 08/28/2023 0933   CHOLHDL 4.5 05/01/2023 0951   LDLCALC 143 (H) 01/08/2024 0914   LDLCALC 161 (H) 05/01/2023 0951   Hepatic Function Panel     Component Value Date/Time   PROT 7.0 09/16/2024 1537   ALBUMIN 4.7 09/16/2024 1537   AST 20  09/16/2024 1537   ALT 15 09/16/2024 1537   ALKPHOS 49 09/16/2024 1537   BILITOT 0.3 09/16/2024 1537   BILIDIR 0.1 10/24/2022 1008   IBILI 0.5 10/24/2022 1008      Component Value Date/Time   TSH 1.850 01/08/2024 0914   Nutritional Lab Results  Component Value Date   VD25OH 32.8 09/16/2024   VD25OH 26.9 (L) 01/08/2024   VD25OH 25.8 (L) 09/05/2022     ASSESSMENT AND PLAN  TREATMENT PLAN FOR OBESITY:  Recommended Dietary Goals  Alex Mitchell is currently in the action stage of change. As such, his goal is to continue weight management plan. He has agreed to practicing portion control and making smarter food choices, such as increasing vegetables and decreasing simple carbohydrates.  Behavioral Intervention  We discussed the following Behavioral Modification Strategies today: increasing lean protein intake to established goals, decreasing simple carbohydrates , increasing vegetables, increasing fiber rich foods, increasing water intake , work on meal planning and preparation, work on tracking and journaling calories using tracking application, reading food labels , keeping healthy foods at home, planning for success, continue to work on maintaining a reduced calorie state, getting the recommended amount of protein, incorporating whole foods, making healthy choices, staying well hydrated and practicing mindfulness when eating., and increase protein intake, fibrous foods (25 grams per day for women, 30 grams for men) and water to improve satiety and decrease hunger signals. .  Additional resources provided today: NA  Recommended Physical Activity Goals  Alex Mitchell has been advised to work up to 150 minutes of moderate intensity aerobic activity a week and strengthening exercises 2-3 times per week for cardiovascular health, weight loss maintenance and preservation of muscle mass.   He has agreed to Think about enjoyable ways to increase daily physical activity and overcoming barriers to  exercise, Increase physical activity in their day and reduce sedentary time (increase NEAT)., Continue to gradually increase the amount and intensity of exercise routine, Increase volume of physical activity to a goal of 240 minutes a week, and Combine aerobic and strengthening exercises for efficiency and improved cardiometabolic health.   Pharmacotherapy We discussed various medication options to help Devaughn with his weight loss efforts and we both agreed to start [facility] 0.25mg .  side effects discussed.  Avoid high dose Phentermine  due to CKD Avoid high dose of Topamax  due to  Avoid orlistat due to Vit D def  Contraindications:  Pancreatitis (active gallstones) Medullary thyroid cancer High triglycerides (>500)-will need labs prior to starting Multiple Endocrine Neoplasia syndrome type 2 (MEN 2) Trying to get pregnant Breastfeeding Use with caution with taking insulin  or  sulfonylureas (will need to monitor blood sugars for hypoglycemia)  ASSOCIATED CONDITIONS ADDRESSED TODAY  Action/Plan  Vitamin D  insufficiency -     Vitamin D  (Ergocalciferol ); Take 1 capsule (50,000 Units total) by mouth every 7 (seven) days.  Dispense: 5 capsule; Refill: 0  Class 2 severe obesity due to excess calories with serious comorbidity and body mass index (BMI) of 37.0 to 37.9 in adult -     Tzhncb; Inject 0.25 mg into the skin once a week.  Dispense: 2 mL; Refill: 0       Labs reviewed in chart with patient from 09/16/24  Return in about 4 weeks (around 10/28/2024).SABRA He was informed of the importance of frequent follow up visits to maximize his success with intensive lifestyle modifications for his multiple health conditions.   ATTESTASTION STATEMENTS:  Reviewed by clinician on day of visit: allergies, medications, problem list, medical history, surgical history, family history, social history, and previous encounter notes.     Corean SAUNDERS. Danine Hor FNP-C

## 2024-09-30 NOTE — Patient Instructions (Addendum)
 What is a GLP-1 Glucagon like peptide-1 (GLP-1) agonists represent a class of medications used to treat type 2 diabetes mellitus and obesity.  GLP-1 medications mimic the action of a hormone called glucagon like peptide 1.  When blood sugar levels start to rise/increase these drugs stimulate the body to produce more insulin.  When that happens, the extra insulin helps to lower the blood sugar levels in the body.  This in returns helps with decreasing cravings.  These medications also slow the movement of food from the stomach into the small intestine.  This in return helps one to full faster and longer.   Diabetic medications: Approved for treatment of diabetes mellitus but does not have full approval for weight loss use Victoza (liraglutide) Ozempic (semaglutide) Mounjaro Trulicity Rybelsus  Weight loss medications: Approved for long-term weight loss use.        Saxenda (liraglutide) Wegovy (semaglutide) Zepbound Contraindications:  Pancreatitis (active gallstones) Medullary thyroid cancer High triglycerides (>500)-will need labs prior to starting Multiple Endocrine Neoplasia syndrome type 2 (MEN 2) Trying to get pregnant Breastfeeding Use with caution with taking insulin or sulfonylureas (will need to monitor blood sugars for hypoglycemia) Side effects (most common): Most common side effects are nausea, gas, bloating and constipation.  Other possible side effects are headaches, belching, diarrhea, tiredness (fatigue), vomiting, upset stomach, dizziness, heartburn and stomach (abdominal pain).  If you think that you are becoming dehydrated, please inform our office or your primary family provider.  Stop immediately and go to ER if you have any symptoms of a serious allergic reaction including swelling of your face, lips, tongue or throat; problems breathing or swallowing; severe rash or itching; fainting or feeling dizzy; or very rapid heart rate.                                                                                          Steps to starting your [facility]  The office staff will send a prior authorization request to your insurance company for approval. We will send you a mychart message once we hear back from your insurance with a decision.  This can take up to 7-10 business days.   Once your WegovyTis approved, you may then pick up [facility] pen from your pharmacy.    Learn how to do Wegovy injections on the [employer] website. There is a training video that will walk you through how to safely perform the injection. If you have questions for our clinical staff, please contact our  clinical staff. If you have any symptoms of allergic reaction to [facility] discontinue immediately and call 911.  1. What should I tell my provider before using WegovyT ? have or have had problems with your pancreas or kidneys. have type 2 diabetes and a history of diabetic retinopathy. have or have had depression, suicidal thoughts, or mental health issues. are pregnant or plan to become pregnant. Joesphine Bare may harm your unborn baby. You should stop using WegovyT 3 months before you plan to become pregnant or if you are breastfeeding or plan to breastfeed. It is not known if WegovyT passes into your breast milk.  2. What is [place] and  how does it work?  Joesphine Bare is an injectable prescription medication prescribed by your provider to help with your weight loss.  This medicine will be most effective when combined with a reduced calorie diet and physical activity.  Joesphine Bare is not for the treatment of type 2 diabetes mellitus. Joesphine Bare should not be used with other GLP-1 receptor agonist medicines. The addition of WegovyT in  patients treated with insulin has not been evaluated. When initiating WegovyT, consider reducing the dose of concomitantly administered insulin secretagogues (such as sulfonylureas) or insulin to reduce the risk of  hypoglycemia.  One role of GLP-1 is to send a signal to your  brain to tell it you are full. It also slows down stomach emptying which will make you feel full longer and may help with reducing cravings.   3.  How should I take WegovyT?  Administer WegovyT once weekly, on the same day each week, at any time of day, with or without meals Inject subcutaneously in the abdomen, thigh or upper arm Initiate at 0.25 mg once weekly for 4 weeks. In 4 week intervals, increase the dose until a dose of 2.4 mg is reached (we will discuss with you the dosage at each visit). The maintenance dose of WegovyT is 2.4 mg once weekly.  The dosing schedule of Wegovy is:  0.25 mg per week X 4 weeks 0.5 mg per week X 4 weeks 1.0 mg per week X 4 weeks 1.7 mg per week X 4 weeks 2.4 mg per week   Missed dose   If you miss your injection day, go ahead inject your current dose. You can go >7 days, but not <7 days between injections. You may change your injection day (It must be >7 days). If you miss >2 doses, you can still keep next injection dose the same or follow de-escalation schedule which may minimize GI symptoms.   In patients with type 2 diabetes, monitor blood glucose prior to starting and during WEGOVYT treatment.   Inject your dose of Wegovy under the skin (subcutaneous injection) in your stomach area (abdomen), upper leg (thigh) or upper arm. Do not inject into a vein or a muscle. The injection site should be rotated and not given in the same spot each day. Hold the needle under the skin and count to "10". This will allow all of the medicine to be dispensed under the skin. Always wipe your skin with an alcohol prep pad before injection  Dispose of used pen in an approved sharps container. More practical options that can be put in the trash  to go to the landfill are milk jugs or plastic laundry detergent containers with a screw on lid.  What side effects may I notice from taking WegovyT?  Side effects that usually do not require medical attention (report to our  office if they continue or are bothersome): Nausea (most common but decreases over time in most people as their body gets used to the medicine) Diarrhea Constipation (you may take an over the counter laxative if needed) Headache Decreased appetite Upset stomach Tiredness Dizziness Feeling bloated Hair loss Belching Gas Heartburn  Side effects that you should call 911 as soon as possible Vomiting Stomach pain Fever Yellowing of your skin or eyes  Clay-colored stools Increased heart rate while at rest Low blood sugar  Sudden changes in mood, behaviors, thoughts, feelings, or thoughts of suicide If you get a lump or swelling in your neck, hoarseness, trouble swallowing, or shortness of breath. Allergic  reaction such as skin rash, itching, hives, swelling of the face, tongue, or lips  Helpful tips for managing nausea Nausea is a common side effect when first starting WegovyT. If you experience nausea, be sure to connect with your health care provider. He or she will offer guidance on ways to manage it, which may include: Eat bland, low-fat foods, like crackers, toast and rice  Eat foods that contain water, like soups and gelatin  Avoid lying down after you eat  Go outdoors for fresh air  Eat more slowly    Other important information Do not drop your pen or knock it against hard surfaces  Do not expose your pen to any liquids  If you think that your pen may be damaged, do not try to fix it. Use a new one Keep the pen cap on until you are ready to inject. Your pen will no longer be sterile if you store an unused pen without the cap, if you pull the pen cap off and put it on again, or if the pen cap is missing. This could lead to an infection  Store the [place] pen in the refrigerator from 47F to 47F (2C to 8C) If needed, before removing the pen cap, WegovyT can be stored from 8C to 30C (47F to 21F) in the original carton for up to 28 days.  Keep WegovyT in the original  carton to protect it from light  Do not freeze  Throw away pen if WegovyT has been frozen, has been exposed to light or temperatures above 21F (30C), or has been out of the refrigerator for 28 days or longer It's important to properly dispose of your used WegovyT pens. Do not throw the pen away in your household trash. Instead, use an FDA-cleared sharps disposable container or a sturdy household container with a tight-fitting lid, like a heavy duty plastic container.   ZOXWRU pen training website: NastyThought.uy  Wegovy savings and support link: achegone.com

## 2024-09-30 NOTE — Telephone Encounter (Signed)
 PA submitted through Cover My Meds for [facility]. Awaiting insurance determination. Key: [id number]

## 2024-10-01 ENCOUNTER — Encounter: Payer: Self-pay | Admitting: Nurse Practitioner

## 2024-10-05 ENCOUNTER — Telehealth: Payer: Self-pay | Admitting: Nurse Practitioner

## 2024-10-05 NOTE — Telephone Encounter (Signed)
 Patient stated the medication that was sent in last week is too expensive. He is wanting to know if there can be a medication change or can he go back to the medication he was doing before. Please give him a call or send a mychart message. Phone number is [phone]. Thank you!

## 2024-10-06 NOTE — Telephone Encounter (Signed)
 Please advise. Patient is calling about this topic

## 2024-10-07 ENCOUNTER — Other Ambulatory Visit: Payer: Self-pay | Admitting: Nurse Practitioner

## 2024-10-07 DIAGNOSIS — E66812 Obesity, class 2: Secondary | ICD-10-CM

## 2024-10-07 DIAGNOSIS — F5089 Other specified eating disorder: Secondary | ICD-10-CM

## 2024-10-07 DIAGNOSIS — R632 Polyphagia: Secondary | ICD-10-CM

## 2024-10-08 ENCOUNTER — Telehealth: Payer: Self-pay

## 2024-10-08 NOTE — Telephone Encounter (Signed)
 Medical records and appeal letter faxed to [facility] at [phone].

## 2024-10-14 ENCOUNTER — Other Ambulatory Visit: Payer: Self-pay | Admitting: Nurse Practitioner

## 2024-10-14 DIAGNOSIS — R632 Polyphagia: Secondary | ICD-10-CM

## 2024-10-14 MED ORDER — TOPIRAMATE 25 MG PO TABS: 25.0000 mg | ORAL_TABLET | Freq: Two times a day (BID) | ORAL | 0 refills | Status: DC

## 2024-10-14 MED ORDER — PHENTERMINE HCL 8 MG PO TABS: ORAL_TABLET | ORAL | 0 refills | Status: DC

## 2024-10-16 ENCOUNTER — Other Ambulatory Visit (HOSPITAL_COMMUNITY): Payer: Self-pay

## 2024-10-19 ENCOUNTER — Other Ambulatory Visit: Payer: Self-pay

## 2024-10-21 ENCOUNTER — Other Ambulatory Visit: Payer: Self-pay | Admitting: Pharmacist

## 2024-10-21 ENCOUNTER — Other Ambulatory Visit: Payer: Self-pay

## 2024-10-21 ENCOUNTER — Other Ambulatory Visit (HOSPITAL_COMMUNITY): Payer: Self-pay

## 2024-10-21 DIAGNOSIS — Z21 Asymptomatic human immunodeficiency virus [HIV] infection status: Secondary | ICD-10-CM

## 2024-10-21 MED ORDER — DOVATO 50-300 MG PO TABS
1.0000 | ORAL_TABLET | Freq: Every day | ORAL | 0 refills | Status: AC
  Filled 2024-10-21: qty 90, 90d supply, fill #0

## 2024-10-21 NOTE — Progress Notes (Signed)
 [facility] reached out as patient requesting 90-day supply which is covered through insurance. Resending rx as 90 days.  Alan Geralds, PharmD, CPP, BCIDP, AAHIVP Clinical Pharmacist Practitioner Infectious Diseases Clinical Pharmacist [facility] for Infectious Disease

## 2024-10-21 NOTE — Progress Notes (Signed)
 Specialty Pharmacy Refill Coordination Note  Alex Mitchell is a 43 y.o. male contacted today regarding refills of specialty medication(s) Dolutegravir -lamiVUDine  (Dovato )   Patient requested Delivery   Delivery date: 11/10/24   Verified address: [address], [place]   Medication will be filled on: 11/09/24

## 2024-10-27 ENCOUNTER — Other Ambulatory Visit: Payer: Self-pay

## 2024-10-27 DIAGNOSIS — Z113 Encounter for screening for infections with a predominantly sexual mode of transmission: Secondary | ICD-10-CM

## 2024-10-27 DIAGNOSIS — B20 Human immunodeficiency virus [HIV] disease: Secondary | ICD-10-CM

## 2024-10-28 ENCOUNTER — Other Ambulatory Visit

## 2024-10-28 ENCOUNTER — Other Ambulatory Visit: Payer: Self-pay

## 2024-10-28 ENCOUNTER — Ambulatory Visit: Admitting: Nurse Practitioner

## 2024-10-28 DIAGNOSIS — Z113 Encounter for screening for infections with a predominantly sexual mode of transmission: Secondary | ICD-10-CM

## 2024-10-28 DIAGNOSIS — B20 Human immunodeficiency virus [HIV] disease: Secondary | ICD-10-CM

## 2024-10-29 LAB — T-HELPER CELL (CD4) - (RCID CLINIC ONLY)
CD4 % Helper T Cell: 18 % — ABNORMAL LOW (ref 33–65)
CD4 T Cell Abs: 435 /uL (ref 400–1790)

## 2024-10-30 LAB — HIV-1 RNA QUANT-NO REFLEX-BLD
HIV 1 RNA Quant: NOT DETECTED {copies}/mL
HIV-1 RNA Quant, Log: NOT DETECTED {Log_copies}/mL

## 2024-10-30 LAB — SYPHILIS: RPR W/REFLEX TO RPR TITER AND TREPONEMAL ANTIBODIES, TRADITIONAL SCREENING AND DIAGNOSIS ALGORITHM: RPR Ser Ql: NONREACTIVE

## 2024-11-04 ENCOUNTER — Ambulatory Visit: Admitting: Nurse Practitioner

## 2024-11-06 ENCOUNTER — Other Ambulatory Visit: Payer: Self-pay

## 2024-11-06 ENCOUNTER — Ambulatory Visit: Admitting: Infectious Diseases

## 2024-11-06 ENCOUNTER — Other Ambulatory Visit (HOSPITAL_COMMUNITY): Payer: Self-pay

## 2024-11-06 VITALS — Ht 68.0 in | Wt 251.0 lb

## 2024-11-06 DIAGNOSIS — Z23 Encounter for immunization: Secondary | ICD-10-CM | POA: Diagnosis not present

## 2024-11-06 DIAGNOSIS — Z1212 Encounter for screening for malignant neoplasm of rectum: Secondary | ICD-10-CM

## 2024-11-06 DIAGNOSIS — Z21 Asymptomatic human immunodeficiency virus [HIV] infection status: Secondary | ICD-10-CM | POA: Diagnosis not present

## 2024-11-06 DIAGNOSIS — Z113 Encounter for screening for infections with a predominantly sexual mode of transmission: Secondary | ICD-10-CM

## 2024-11-06 MED ORDER — DOXYCYCLINE HYCLATE 100 MG PO TABS
200.0000 mg | ORAL_TABLET | ORAL | 2 refills | Status: AC
  Filled 2024-11-06: qty 20, 30d supply, fill #0
  Filled 2024-11-11: qty 20, 10d supply, fill #0

## 2024-11-06 MED ORDER — DOVATO 50-300 MG PO TABS
1.0000 | ORAL_TABLET | Freq: Every day | ORAL | 3 refills | Status: AC
  Filled 2024-11-06: qty 30, 30d supply, fill #0
  Filled 2024-12-02: qty 30, 30d supply, fill #1
  Filled 2024-12-25: qty 90, 90d supply, fill #2

## 2024-11-06 NOTE — Patient Instructions (Addendum)
 Refills for Dovato  have been sent in   What is DOXY-PEP? When you take preventative antibiotics to prevent sexually transmitted infections (STIs). This has been shown to reduce syphilis and chlamydia re-infections by up to 70% in some studies.   How to take your antibiotic? Take TWO tablets (200 mg) of Doxycycline  ONCE with food and a full glass of water within 24 hours (no later than 72 hours) after condomless sex (oral or rectal).   DOXYCYCLINE  Information when you take it:  Take with food --> otherwise you will likely experience some bad nausea +/- vomiting, abdominal pains Make sure you drink a full 8 oz glass of water with each dose Sit upright for 1 hour after to prevent heartburn  Common Questions:  Does oral sex count as an at risk exposure --> YES. Whether receiving or performing, you can take Doxy-PEP to help prevent this type of transmission  If I have have more than one sexual partner per day do I need a dose every time? --> NO. Only one dose in a 24 hour period of time is sufficient.  If I have several episodes of sex consecutive days in a row can I take a dose a day? --> YES. You can take this preventative dose once a day if needed.  Should I share my medication with other partners --> NO. This has not been proven to be helpful for everyone and it's best to keep your medicine yours. Plus if they have an STD already, it can lead to increased resistance to antibiotics.    Please plan to get your 2nd HPV vaccine in 2 months - can do that here or can see if they have it at any [facility]

## 2024-11-06 NOTE — Progress Notes (Signed)
 "  Name: Alex Mitchell  DOB: 1980-12-15 MRN: [id number] PCP: Paseda, Folashade R, FNP    Brief Narrative:  Alex Mitchell is a 43 y.o. 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  History of OIs: PJP pna, esophageal candidiasis  Hep B s Ab (+ 2022)  Previous Regimens: Biktarvy  09-2018  Dovato     Genotypes: Not performed in hospital   Subjective    Subjective:   CC: Routine Follow up care     Discussed the use of AI scribe software for clinical note transcription with the patient, who gave verbal consent to proceed.  History of Present Illness   Alex Mitchell is a 43 year old male with HIV who presents for routine follow-up care.  He is doing well on his current HIV medication regimen, which includes Steglatro once daily. Recent laboratory results show an undetectable HIV RNA level and a CD4 count of 435. Syphilis screening was negative. He has previously undergone anal cancer screening.  He reports intermittent mild anemia, with a recent hemoglobin level of 13.2 and an elevated MCV of 100. His A1c screening was negative. He continues to manage his weight with phentermine , although he has switched to a different medication due to insurance issues.  He is currently employed at [employer] and is pursuing a degree in business administration. He reports being busy with work and school but is managing well.  He is still seeing a kidney specialist and reports stable kidney function. He has been screened for diabetes, which was negative, and he maintains a healthy weight.  He requests a refill of doxepin, which he has not taken in a while, and confirms that his Dovato  prescription has been moved to a three-month supply.      Review of Systems  All other systems reviewed and are negative.      Outpatient Medications Prior to Visit  Medication Sig Dispense Refill   albuterol  (VENTOLIN  HFA) 108 (90 Base) MCG/ACT  inhaler Inhale 1-2 puffs into the lungs every 6 (six) hours as needed. 8 g 0   atorvastatin  (LIPITOR) 20 MG tablet Take 1 tablet (20 mg total) by mouth daily. 90 tablet 1   cetirizine  (ZYRTEC ) 10 MG tablet Take 1 tablet (10 mg total) by mouth daily. 30 tablet 11   Cranberry 250 MG CHEW Chew by mouth.     dapagliflozin  propanediol (FARXIGA ) 10 MG TABS tablet Take 1 tablet (10 mg total) by mouth daily before breakfast. 90 tablet 1   fluticasone  (FLONASE ) 50 MCG/ACT nasal spray Place 2 sprays into both nostrils daily. 16 g 0   Phentermine  HCl (LOMAIRA ) 8 MG TABS Take one po daily 28 tablet 0   topiramate  (TOPAMAX ) 25 MG tablet Take 1 tablet (25 mg total) by mouth 2 (two) times daily. 60 tablet 0   Vitamin D , Ergocalciferol , (DRISDOL ) 1.25 MG (50000 UNIT) CAPS capsule Take 1 capsule (50,000 Units total) by mouth every 7 (seven) days. 5 capsule 0   dolutegravir -lamiVUDine  (DOVATO ) 50-300 MG tablet Take 1 tablet by mouth daily. 90 tablet 0   doxycycline  (VIBRA -TABS) 100 MG tablet Take 2 tablets (200 mg total) by mouth See admin instructions. Within 24-72 hours of unprotected oral or genital sex. 20 tablet 1   No facility-administered medications prior to visit.     No Known Allergies  Social History   Tobacco Use   Smoking status: Never   Smokeless tobacco: Never  Vaping Use  Vaping status: Never Used  Substance Use Topics   Alcohol use: Not Currently   Drug use: No    Social History   Substance and Sexual Activity  Sexual Activity Not Currently     Objective    Objective:   Vitals:   11/06/24 0904  Weight: 251 lb (113.9 kg)  Height: 5' 8 (1.727 m)      Body mass index is 38.16 kg/m.   Physical Exam Constitutional:      Appearance: Normal appearance. He is not ill-appearing.  HENT:     Head: Normocephalic.     Mouth/Throat:     Mouth: Mucous membranes are moist.     Pharynx: Oropharynx is clear.  Eyes:     General: No scleral icterus. Pulmonary:     Effort:  Pulmonary effort is normal.  Musculoskeletal:        General: Normal range of motion.     Cervical back: Normal range of motion.  Skin:    Coloration: Skin is not jaundiced or pale.  Neurological:     Mental Status: He is alert and oriented to person, place, and time.  Psychiatric:        Mood and Affect: Mood normal.        Judgment: Judgment normal.      Lab Results Lab Results  Component Value Date   WBC 5.7 09/16/2024   HGB 13.2 09/16/2024   HCT 39.7 09/16/2024   MCV 100 (H) 09/16/2024   PLT 209 09/16/2024    Lab Results  Component Value Date   CREATININE 1.82 (H) 09/16/2024   BUN 19 09/16/2024   NA 140 09/16/2024   K 4.6 09/16/2024   CL 102 09/16/2024   CO2 23 09/16/2024    Lab Results  Component Value Date   ALT 15 09/16/2024   AST 20 09/16/2024   ALKPHOS 49 09/16/2024   BILITOT 0.3 09/16/2024    Lab Results  Component Value Date   CHOL 204 (H) 01/08/2024   HDL 54 01/08/2024   LDLCALC 143 (H) 01/08/2024   TRIG 41 01/08/2024   CHOLHDL 3.3 08/28/2023   HIV 1 RNA Quant  Date Value  10/28/2024 NOT DETECTED copies/mL  05/01/2024 NOT DETECTED copies/mL  10/23/2023 Not Detected Copies/mL   CD4 T Cell Abs (/uL)  Date Value  10/28/2024 435  10/23/2023 375 (L)  10/24/2022 424      Assessment & Plan:     Asymptomatic human immunodeficiency virus (HIV) infection - HIV infection is well-controlled with an undetectable viral load and a CD4 count of 435. He is on Dovato , which he has transitioned to a three-month supply.  - Continue Dovato  with three-month supply - Ensure prescription coverage for Dovato  - labs reviewed in person from recently testing.   Sexually transmitted infection (STI) screening - Routine STI screenings are up to date. Syphilis screening was negative. He is eligible for HPV vaccination. - Administered first dose of HPV vaccine today - Scheduled second dose of HPV vaccine in two months - Scheduled third dose of HPV vaccine in six  months - Doxy pep refills provided.   Anal cancer screening - Previous screening was normal with no concerning changes or HPV detected. Screening is recommended annually.      Meds ordered this encounter  Medications   dolutegravir -lamiVUDine  (DOVATO ) 50-300 MG tablet    Sig: Take 1 tablet by mouth daily.    Dispense:  90 tablet    Refill:  3    Refill  has been confirmed with patient. Please complete workflow in [facility]. Dispense 90 days supply, insurance will cover    Prescription Type::   Renewal   doxycycline  (VIBRA -TABS) 100 MG tablet    Sig: Take 2 tablets (200 mg total) by mouth See admin instructions. Within 24-72 hours of unprotected oral or genital sex.    Dispense:  20 tablet    Refill:  2   Orders Placed This Encounter  Procedures   C. trachomatis/N. gonorrhoeae RNA   GC/CT Probe, Amp (Throat)   CT/NG RNA, TMA Rectal   Flu vaccine trivalent PF, 6mos and older(Flulaval,Afluria,Fluarix,Fluzone)   HPV 9-valent vaccine,Recombinat   FU in 4 - 6 m   Corean Fireman, MSN, NP-C [facility] Infectious Disease [facility]  [email or web address] Pager: [phone] Office: [phone] [facility] Main Line: [phone]   "

## 2024-11-07 LAB — CT/NG RNA, TMA RECTAL
Chlamydia Trachomatis RNA: NOT DETECTED
Neisseria Gonorrhoeae RNA: NOT DETECTED

## 2024-11-07 LAB — GC/CHLAMYDIA PROBE, AMP (THROAT)
Chlamydia trachomatis RNA: NOT DETECTED
Neisseria gonorrhoeae RNA: NOT DETECTED

## 2024-11-08 LAB — C. TRACHOMATIS/N. GONORRHOEAE RNA
C. trachomatis RNA, TMA: NOT DETECTED
N. gonorrhoeae RNA, TMA: NOT DETECTED

## 2024-11-09 ENCOUNTER — Ambulatory Visit: Payer: Self-pay | Admitting: Infectious Diseases

## 2024-11-09 ENCOUNTER — Other Ambulatory Visit: Payer: Self-pay

## 2024-11-10 ENCOUNTER — Other Ambulatory Visit: Payer: Self-pay

## 2024-11-10 LAB — CYTOLOGY - NON PAP

## 2024-11-10 LAB — "NON-GYN, SPECIMEN A  "

## 2024-11-11 ENCOUNTER — Other Ambulatory Visit: Payer: Self-pay

## 2024-11-11 ENCOUNTER — Ambulatory Visit: Admitting: Nurse Practitioner

## 2024-11-11 ENCOUNTER — Encounter: Payer: Self-pay | Admitting: Nurse Practitioner

## 2024-11-11 ENCOUNTER — Encounter (HOSPITAL_COMMUNITY): Payer: Self-pay

## 2024-11-11 ENCOUNTER — Other Ambulatory Visit (HOSPITAL_COMMUNITY): Payer: Self-pay

## 2024-11-11 VITALS — BP 133/79 | HR 62 | Temp 98.1°F | Ht 68.0 in | Wt 244.0 lb

## 2024-11-11 DIAGNOSIS — E66812 Obesity, class 2: Secondary | ICD-10-CM

## 2024-11-11 DIAGNOSIS — Z6837 Body mass index (BMI) 37.0-37.9, adult: Secondary | ICD-10-CM

## 2024-11-11 DIAGNOSIS — R632 Polyphagia: Secondary | ICD-10-CM

## 2024-11-11 MED ORDER — TOPIRAMATE 25 MG PO TABS
25.0000 mg | ORAL_TABLET | Freq: Two times a day (BID) | ORAL | 0 refills | Status: AC
  Filled 2024-11-11 (×2): qty 60, 30d supply, fill #0

## 2024-11-11 MED ORDER — PHENTERMINE HCL 8 MG PO TABS
8.0000 mg | ORAL_TABLET | Freq: Every day | ORAL | 0 refills | Status: AC
  Filled 2024-11-11 – 2024-11-18 (×4): qty 30, 30d supply, fill #0

## 2024-11-11 NOTE — Progress Notes (Signed)
 "  Office: [phone]  /  Fax: [phone]  WEIGHT SUMMARY AND BIOMETRICS  Weight Lost Since Last Visit: 4lb  Weight Gained Since Last Visit: 0lb   Vitals Temp: 98.1 F (36.7 C) BP: 133/79 Pulse Rate: 62 SpO2: 100 %   Anthropometric Measurements Height: 5' 8 (1.727 m) Weight: 244 lb (110.7 kg) BMI (Calculated): 37.11 Weight at Last Visit: 248lb Weight Lost Since Last Visit: 4lb Weight Gained Since Last Visit: 0lb Starting Weight: 242lb Total Weight Loss (lbs): 0 lb (0 kg)   Body Composition  Body Fat %: 30.8 % Fat Mass (lbs): 75.4 lbs Muscle Mass (lbs): 160.8 lbs Total Body Water (lbs): 115.2 lbs Visceral Fat Rating : 16   Other Clinical Data Fasting: Yes Labs: No Today's Visit #: 7 Starting Date: 01/08/24     HPI  Chief Complaint: OBESITY  Alex Mitchell is here to discuss his progress with his obesity treatment plan. He is on the the Category 4 Plan and states he is following his eating plan approximately 70 % of the time. He states he is exercising 0 minutes 0 days per week.   Interval History:  Since last office visit he has lost 4 pounds. He feels that he is overall doing well with the meal plan.  He does struggle sometimes with meeting his calorie and protein goals.    He is drinking water daily.  He works at [employer] and is very active at work.  He recently signed up for a gym in [place].     Pharmacotherapy for weight loss: He is currently taking Lomaira  8mg  and topamax  25mg  around 7am and he takes the second dose of topamax  25mg  around 5 pm  for medical weight loss.  Denies side effects.  Denies chest pain, SHOB or palpations.    Previous pharmacotherapy for medical weight loss:  Phentermine  37.5-side effects headaches and Topamax    Bariatric surgery:  Patient has not had bariatric surgery.  PHYSICAL EXAM:  Blood pressure 133/79, pulse 62, temperature 98.1 F (36.7 C), height 5' 8 (1.727 m), weight 244 lb (110.7 kg), SpO2 100%. Body mass index  is 37.1 kg/m.  General: He is overweight, cooperative, alert, well developed, and in no acute distress. PSYCH: Has normal mood, affect and thought process.   Extremities: No edema.  Neurologic: No gross sensory or motor deficits. No tremors or fasciculations noted.    DIAGNOSTIC DATA REVIEWED:  BMET    Component Value Date/Time   NA 140 09/16/2024 1537   K 4.6 09/16/2024 1537   CL 102 09/16/2024 1537   CO2 23 09/16/2024 1537   GLUCOSE 85 09/16/2024 1537   GLUCOSE 93 05/01/2024 0857   BUN 19 09/16/2024 1537   CREATININE 1.82 (H) 09/16/2024 1537   CREATININE 2.03 (H) 05/01/2024 0857   CALCIUM  9.7 09/16/2024 1537   GFRNONAA 48 (L) 12/07/2019 0949   GFRAA 55 (L) 12/07/2019 0949   Lab Results  Component Value Date   HGBA1C 5.6 09/16/2024   HGBA1C 5.4 11/05/2018   Lab Results  Component Value Date   INSULIN  9.2 01/08/2024   Lab Results  Component Value Date   TSH 1.850 01/08/2024   CBC    Component Value Date/Time   WBC 5.7 09/16/2024 1537   WBC 5.8 05/01/2024 0857   RBC 3.97 (L) 09/16/2024 1537   RBC 3.91 (L) 05/01/2024 0857   HGB 13.2 09/16/2024 1537   HCT 39.7 09/16/2024 1537   PLT 209 09/16/2024 1537   MCV 100 (H) 09/16/2024 1537  MCH 33.2 (H) 09/16/2024 1537   MCH 32.2 05/01/2024 0857   MCHC 33.2 09/16/2024 1537   MCHC 32.1 05/01/2024 0857   RDW 13.1 09/16/2024 1537   Iron Studies    Component Value Date/Time   IRON 77 09/16/2024 1537   TIBC 347 01/08/2024 0914   FERRITIN 268 09/16/2024 1537   IRONPCTSAT 22 01/08/2024 0914   IRONPCTSAT 19 (L) 08/13/2019 1031   Lipid Panel     Component Value Date/Time   CHOL 204 (H) 01/08/2024 0914   TRIG 41 01/08/2024 0914   HDL 54 01/08/2024 0914   CHOLHDL 3.3 08/28/2023 0933   CHOLHDL 4.5 05/01/2023 0951   LDLCALC 143 (H) 01/08/2024 0914   LDLCALC 161 (H) 05/01/2023 0951   Hepatic Function Panel     Component Value Date/Time   PROT 7.0 09/16/2024 1537   ALBUMIN 4.7 09/16/2024 1537   AST 20 09/16/2024  1537   ALT 15 09/16/2024 1537   ALKPHOS 49 09/16/2024 1537   BILITOT 0.3 09/16/2024 1537   BILIDIR 0.1 10/24/2022 1008   IBILI 0.5 10/24/2022 1008      Component Value Date/Time   TSH 1.850 01/08/2024 0914   Nutritional Lab Results  Component Value Date   VD25OH 32.8 09/16/2024   VD25OH 26.9 (L) 01/08/2024   VD25OH 25.8 (L) 09/05/2022     ASSESSMENT AND PLAN  TREATMENT PLAN FOR OBESITY:  Recommended Dietary Goals  Alex Mitchell is currently in the action stage of change. As such, his goal is to continue weight management plan. He has agreed to keeping a food journal and adhering to recommended goals of 1800-2000 calories and 100+ grams of protein.  Behavioral Intervention  We discussed the following Behavioral Modification Strategies today: increasing lean protein intake to established goals, decreasing simple carbohydrates , increasing vegetables, increasing fiber rich foods, avoiding skipping meals, increasing water intake , work on meal planning and preparation, reading food labels , keeping healthy foods at home, planning for success, celebration eating strategies, continue to work on maintaining a reduced calorie state, getting the recommended amount of protein, incorporating whole foods, making healthy choices, staying well hydrated and practicing mindfulness when eating., and increase protein intake, fibrous foods (25 grams per day for women, 30 grams for men) and water to improve satiety and decrease hunger signals. .  Additional resources provided today: NA  Recommended Physical Activity Goals  Alex Mitchell has been advised to work up to 150 minutes of moderate intensity aerobic activity a week and strengthening exercises 2-3 times per week for cardiovascular health, weight loss maintenance and preservation of muscle mass.   He has agreed to Think about enjoyable ways to increase daily physical activity and overcoming barriers to exercise, Increase physical activity in their day  and reduce sedentary time (increase NEAT)., Start strengthening exercises with a goal of 2-3 sessions a week , Work on scheduling and tracking physical activity. , Continue to gradually increase the amount and intensity of exercise routine, Increase volume of physical activity to a goal of 240 minutes a week, and Combine aerobic and strengthening exercises for efficiency and improved cardiometabolic health.   Pharmacotherapy We discussed various medication options to help Alex Mitchell with his weight loss efforts and we both agreed to continue Lomaria 8mg  and topamax  25mg  BID for food impulse control and cravings.  Side effects discussed.  ASSOCIATED CONDITIONS ADDRESSED TODAY  Action/Plan  Polyphagia -     Phentermine  HCl; Take 1 tablet (8 mg total) by mouth daily.  Dispense: 30 tablet; Refill:  0 -     Topiramate ; Take 1 tablet (25 mg total) by mouth 2 (two) times daily.  Dispense: 60 tablet; Refill: 0  Class 2 severe obesity due to excess calories with serious comorbidity and body mass index (BMI) of 37.0 to 37.9 in adult -     Phentermine  HCl; Take 1 tablet (8 mg total) by mouth daily.  Dispense: 30 tablet; Refill: 0 -     Topiramate ; Take 1 tablet (25 mg total) by mouth 2 (two) times daily.  Dispense: 60 tablet; Refill: 0         Return in about 4 weeks (around 12/09/2024).SABRA He was informed of the importance of frequent follow up visits to maximize his success with intensive lifestyle modifications for his multiple health conditions.   ATTESTASTION STATEMENTS:  Reviewed by clinician on day of visit: allergies, medications, problem list, medical history, surgical history, family history, social history, and previous encounter notes.      Corean SAUNDERS. Alex Chamblee FNP-C "

## 2024-11-15 ENCOUNTER — Other Ambulatory Visit (HOSPITAL_COMMUNITY): Payer: Self-pay

## 2024-11-17 ENCOUNTER — Other Ambulatory Visit: Payer: Self-pay

## 2024-11-17 ENCOUNTER — Encounter: Payer: Self-pay | Admitting: Pharmacist

## 2024-11-18 ENCOUNTER — Other Ambulatory Visit (HOSPITAL_COMMUNITY): Payer: Self-pay

## 2024-11-20 ENCOUNTER — Other Ambulatory Visit: Payer: Self-pay

## 2024-12-02 ENCOUNTER — Encounter (INDEPENDENT_AMBULATORY_CARE_PROVIDER_SITE_OTHER): Payer: Self-pay

## 2024-12-02 ENCOUNTER — Other Ambulatory Visit: Payer: Self-pay

## 2024-12-02 ENCOUNTER — Other Ambulatory Visit: Payer: Self-pay | Admitting: Pharmacy Technician

## 2024-12-02 NOTE — Progress Notes (Signed)
 Specialty Pharmacy Refill Coordination Note  Alex Mitchell is a 44 y.o. male contacted today regarding refills of specialty medication(s) Dolutegravir -lamiVUDine  (Dovato )   Patient requested Delivery   Delivery date: 12/04/24   Verified address: [address], [place], [address]   Medication will be filled on: 12/03/24

## 2024-12-03 ENCOUNTER — Other Ambulatory Visit: Payer: Self-pay

## 2024-12-09 ENCOUNTER — Ambulatory Visit: Admitting: Nurse Practitioner

## 2024-12-25 ENCOUNTER — Other Ambulatory Visit: Payer: Self-pay

## 2025-02-05 ENCOUNTER — Ambulatory Visit: Payer: Self-pay

## 2025-04-13 ENCOUNTER — Ambulatory Visit: Payer: Self-pay | Admitting: Nurse Practitioner

## 2025-05-14 ENCOUNTER — Ambulatory Visit: Payer: Self-pay | Admitting: Infectious Diseases
